# Patient Record
Sex: Female | Born: 1955 | Race: White | Hispanic: No | Marital: Married | Smoking: Never smoker
Health system: Southern US, Community
[De-identification: ages and names within clinical notes are randomized; demographics above are authoritative.]

## PROBLEM LIST (undated history)

## (undated) DIAGNOSIS — Z801 Family history of malignant neoplasm of trachea, bronchus and lung: Secondary | ICD-10-CM

## (undated) DIAGNOSIS — J309 Allergic rhinitis, unspecified: Secondary | ICD-10-CM

## (undated) DIAGNOSIS — Z803 Family history of malignant neoplasm of breast: Secondary | ICD-10-CM

## (undated) DIAGNOSIS — Z808 Family history of malignant neoplasm of other organs or systems: Secondary | ICD-10-CM

## (undated) DIAGNOSIS — Z9189 Other specified personal risk factors, not elsewhere classified: Secondary | ICD-10-CM

## (undated) DIAGNOSIS — K219 Gastro-esophageal reflux disease without esophagitis: Secondary | ICD-10-CM

## (undated) DIAGNOSIS — F411 Generalized anxiety disorder: Secondary | ICD-10-CM

## (undated) DIAGNOSIS — R198 Other specified symptoms and signs involving the digestive system and abdomen: Secondary | ICD-10-CM

## (undated) DIAGNOSIS — Z800 Family history of malignant neoplasm of digestive organs: Secondary | ICD-10-CM

## (undated) DIAGNOSIS — IMO0001 Reserved for inherently not codable concepts without codable children: Secondary | ICD-10-CM

## (undated) HISTORY — DX: Family history of malignant neoplasm of trachea, bronchus and lung: Z80.1

## (undated) HISTORY — DX: Generalized anxiety disorder: F41.1

## (undated) HISTORY — DX: Other specified personal risk factors, not elsewhere classified: Z91.89

## (undated) HISTORY — PX: TONSILLECTOMY: SUR1361

## (undated) HISTORY — DX: Family history of malignant neoplasm of other organs or systems: Z80.8

## (undated) HISTORY — DX: Other specified symptoms and signs involving the digestive system and abdomen: R19.8

## (undated) HISTORY — DX: Gastro-esophageal reflux disease without esophagitis: K21.9

## (undated) HISTORY — DX: Reserved for inherently not codable concepts without codable children: IMO0001

## (undated) HISTORY — DX: Allergic rhinitis, unspecified: J30.9

## (undated) HISTORY — DX: Family history of malignant neoplasm of digestive organs: Z80.0

## (undated) HISTORY — DX: Family history of malignant neoplasm of breast: Z80.3

---

## 1977-02-12 HISTORY — PX: BREAST BIOPSY: SHX20

## 1990-02-12 HISTORY — PX: ABDOMINAL HYSTERECTOMY: SHX81

## 1998-09-13 ENCOUNTER — Other Ambulatory Visit: Admission: RE | Admit: 1998-09-13 | Discharge: 1998-09-13 | Payer: Self-pay | Admitting: Obstetrics & Gynecology

## 1999-10-04 ENCOUNTER — Other Ambulatory Visit: Admission: RE | Admit: 1999-10-04 | Discharge: 1999-10-04 | Payer: Self-pay | Admitting: Obstetrics & Gynecology

## 2000-11-22 ENCOUNTER — Other Ambulatory Visit: Admission: RE | Admit: 2000-11-22 | Discharge: 2000-11-22 | Payer: Self-pay | Admitting: Obstetrics and Gynecology

## 2004-03-14 ENCOUNTER — Ambulatory Visit (HOSPITAL_COMMUNITY): Admission: RE | Admit: 2004-03-14 | Discharge: 2004-03-14 | Payer: Self-pay | Admitting: Plastic Surgery

## 2004-03-14 ENCOUNTER — Ambulatory Visit (HOSPITAL_BASED_OUTPATIENT_CLINIC_OR_DEPARTMENT_OTHER): Admission: RE | Admit: 2004-03-14 | Discharge: 2004-03-14 | Payer: Self-pay | Admitting: Plastic Surgery

## 2004-03-14 ENCOUNTER — Encounter (INDEPENDENT_AMBULATORY_CARE_PROVIDER_SITE_OTHER): Payer: Self-pay | Admitting: *Deleted

## 2005-03-02 ENCOUNTER — Other Ambulatory Visit: Admission: RE | Admit: 2005-03-02 | Discharge: 2005-03-02 | Payer: Self-pay | Admitting: Obstetrics and Gynecology

## 2005-03-28 ENCOUNTER — Encounter: Admission: RE | Admit: 2005-03-28 | Discharge: 2005-03-28 | Payer: Self-pay | Admitting: Obstetrics and Gynecology

## 2005-03-30 ENCOUNTER — Encounter: Admission: RE | Admit: 2005-03-30 | Discharge: 2005-03-30 | Payer: Self-pay | Admitting: Obstetrics and Gynecology

## 2006-04-19 ENCOUNTER — Encounter: Admission: RE | Admit: 2006-04-19 | Discharge: 2006-04-19 | Payer: Self-pay | Admitting: Obstetrics and Gynecology

## 2006-05-21 ENCOUNTER — Encounter: Payer: Self-pay | Admitting: Internal Medicine

## 2006-06-03 ENCOUNTER — Encounter: Payer: Self-pay | Admitting: Internal Medicine

## 2007-02-13 LAB — HM COLONOSCOPY: HM Colonoscopy: NORMAL

## 2007-04-21 ENCOUNTER — Encounter: Admission: RE | Admit: 2007-04-21 | Discharge: 2007-04-21 | Payer: Self-pay | Admitting: Obstetrics and Gynecology

## 2008-05-18 ENCOUNTER — Encounter: Admission: RE | Admit: 2008-05-18 | Discharge: 2008-05-18 | Payer: Self-pay | Admitting: Obstetrics and Gynecology

## 2009-03-16 ENCOUNTER — Ambulatory Visit: Payer: Self-pay | Admitting: Internal Medicine

## 2009-03-16 DIAGNOSIS — Z9189 Other specified personal risk factors, not elsewhere classified: Secondary | ICD-10-CM

## 2009-03-16 DIAGNOSIS — F411 Generalized anxiety disorder: Secondary | ICD-10-CM

## 2009-03-16 DIAGNOSIS — K219 Gastro-esophageal reflux disease without esophagitis: Secondary | ICD-10-CM

## 2009-03-16 HISTORY — DX: Generalized anxiety disorder: F41.1

## 2009-03-16 HISTORY — DX: Other specified personal risk factors, not elsewhere classified: Z91.89

## 2009-03-16 HISTORY — DX: Gastro-esophageal reflux disease without esophagitis: K21.9

## 2009-05-02 ENCOUNTER — Ambulatory Visit: Payer: Self-pay | Admitting: Internal Medicine

## 2009-05-12 ENCOUNTER — Encounter: Payer: Self-pay | Admitting: Internal Medicine

## 2009-12-26 ENCOUNTER — Telehealth: Payer: Self-pay | Admitting: Internal Medicine

## 2009-12-28 ENCOUNTER — Telehealth (INDEPENDENT_AMBULATORY_CARE_PROVIDER_SITE_OTHER): Payer: Self-pay | Admitting: *Deleted

## 2010-01-10 ENCOUNTER — Telehealth: Payer: Self-pay | Admitting: Internal Medicine

## 2010-01-30 ENCOUNTER — Ambulatory Visit: Payer: Self-pay | Admitting: Internal Medicine

## 2010-01-31 DIAGNOSIS — J309 Allergic rhinitis, unspecified: Secondary | ICD-10-CM | POA: Insufficient documentation

## 2010-01-31 DIAGNOSIS — IMO0001 Reserved for inherently not codable concepts without codable children: Secondary | ICD-10-CM

## 2010-01-31 DIAGNOSIS — R198 Other specified symptoms and signs involving the digestive system and abdomen: Secondary | ICD-10-CM

## 2010-01-31 HISTORY — DX: Reserved for inherently not codable concepts without codable children: IMO0001

## 2010-01-31 HISTORY — DX: Allergic rhinitis, unspecified: J30.9

## 2010-01-31 HISTORY — DX: Other specified symptoms and signs involving the digestive system and abdomen: R19.8

## 2010-03-12 LAB — CONVERTED CEMR LAB
ALT: 15 units/L (ref 0–35)
AST: 20 units/L (ref 0–37)
Alkaline Phosphatase: 62 units/L (ref 39–117)
BUN: 9 mg/dL (ref 6–23)
CO2: 31 meq/L (ref 19–32)
Calcium: 9.6 mg/dL (ref 8.4–10.5)
Chloride: 106 meq/L (ref 96–112)
Creatinine, Ser: 0.6 mg/dL (ref 0.4–1.2)
Direct LDL: 129.1 mg/dL
Eosinophils Relative: 1.5 % (ref 0.0–5.0)
GFR calc non Af Amer: 111.76 mL/min (ref >60)
HCT: 39.4 % (ref 36.0–46.0)
Hemoglobin, Urine: NEGATIVE
Leukocytes, UA: NEGATIVE
Lymphocytes Relative: 40.4 % (ref 12.0–46.0)
Lymphs Abs: 1.4 10*3/uL (ref 0.7–4.0)
MCHC: 33.6 g/dL (ref 30.0–36.0)
Monocytes Absolute: 0.3 10*3/uL (ref 0.1–1.0)
Nitrite: NEGATIVE
Potassium: 4.3 meq/L (ref 3.5–5.1)
RBC: 4.51 M/uL (ref 3.87–5.11)
Sodium: 142 meq/L (ref 135–145)
Specific Gravity, Urine: 1.01 (ref 1.000–1.030)
TSH: 0.79 microintl units/mL (ref 0.35–5.50)
Total Bilirubin: 0.8 mg/dL (ref 0.3–1.2)
Total CHOL/HDL Ratio: 3
Total Protein, Urine: NEGATIVE mg/dL
Total Protein: 7.1 g/dL (ref 6.0–8.3)
Triglycerides: 73 mg/dL (ref 0.0–149.0)
WBC: 3.5 10*3/uL — ABNORMAL LOW (ref 4.5–10.5)

## 2010-03-14 NOTE — Procedures (Signed)
Summary: Colonoscopy/[facility]  [facility]   Imported By: Sherian Rein 04/15/2009 11:12:55  _____________________________________________________________________  External Attachment:    Type:   Image     Comment:   External Document

## 2010-03-14 NOTE — Assessment & Plan Note (Signed)
Summary: NEW [employer] PT--PKG--STC   Vital Signs:  Patient profile:   55 year old female Height:      64.5 inches (163.83 cm) Weight:      126.4 pounds (57.45 kg) BMI:     21.44 O2 Sat:      97 % on Room air Temp:     98.3 degrees F (36.83 degrees C) oral Pulse rate:   73 / minute BP sitting:   110 / 78  (left arm) Cuff size:   regular  Vitals Entered By: Orlan Leavens (March 16, 2009 8:14 AM)  O2 Flow:  Room air CC: New patient Is Patient Diabetic? No Pain Assessment Patient in pain? no        Primary Care Provider:  Newt Lukes MD  CC:  New patient.  History of Present Illness: new pt to me and our practice - here to est care -  patient is here today for annual physical. Patient feels well and has no complaints.   Preventive Screening-Counseling & Management  Alcohol-Tobacco     Alcohol drinks/day: <1     Alcohol type: wine     Alcohol Counseling: not indicated; use of alcohol is not excessive or problematic     Smoking Status: never     Tobacco Counseling: not indicated; no tobacco use  Caffeine-Diet-Exercise     Does Patient Exercise: yes     Exercise Counseling: not indicated; exercise is adequate     Depression Counseling: not indicated; screening negative for depression  Hep-HIV-STD-Contraception     Dental Care Counseling: not indicated; dental care within six months  Safety-Violence-Falls     Seat Belt Use: yes     Seat Belt Counseling: not indicated; patient wears seat belts     Firearms in the Home: firearms in the home     Firearm Counseling: not indicated; uses recommended firearm safety measures     Smoke Detectors: yes     Smoke Detector Counseling: n/a     Violence in the Home: no risk noted     Violence Counseling: not indicated; no violence risk noted     Sexual Abuse: no     Sexual Abuse Counseling: n/a     Fall Risk Counseling: not indicated; no significant falls noted  Clinical Review Panels:  Prevention   Last Mammogram:   [facility].   Assessment: BIRADS 2. (05/18/2008)   Last Pap Smear:  Interpretation/Result:Negative for intraepithelial Lesion or Malignancy.    (02/13/2008)   Last Colonoscopy:  Pt states does'nt remember md name who done colonoscopy. was sent by dr. Vincente Poli. results was normal (02/13/2007)  Immunizations   Last Tetanus Booster:  Historical (02/13/2007)   Current Medications (verified): 1)  Multivitamins  Tabs (Multiple Vitamin) .... Once Daily 2)  Calcium 500 Mg Tabs (Calcium) .... Once Daily  Allergies (verified): No Known Drug Allergies  Past History:  Past Medical History: GERD anxiety  MD rooster: [facility]  Past Surgical History: Tonsillectomy Breast biopsy (1979) hysterectomy - 1992  Family History: Family History of Arthritis, RA (grandparent) Family History Breast cancer 1st degree relative <50 (other relativ) Family History of Colon CA 1st degree relative <60 ( grandparent) Family History Diabetes 1st degree relative (parent) Family History Hypertension (parent) Family History Lung cancer (grandparent) Heart disease (grandparent)  Social History: Never Smoked married lives with spouse 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   Physical Exam  General:  alert, well-developed, well-nourished, and cooperative to examination.    Eyes:  vision grossly intact; pupils equal, round and reactive to light.  conjunctiva and lids normal.    Ears:  normal pinnae bilaterally, without erythema, swelling, or tenderness to palpation. TMs clear, without effusion, or cerumen impaction. Hearing grossly normal bilaterally  Mouth:  teeth and gums in good repair; mucous membranes  moist, without lesions or ulcers. oropharynx clear without exudate, no erythema.  Neck:  No deformities, masses, or tenderness noted. Lungs:  normal respiratory effort, no intercostal retractions or use of accessory muscles; normal breath sounds bilaterally - no crackles and no wheezes.    Heart:  normal rate, regular rhythm, no murmur, and no rub. BLE without edema. normal DP pulses and normal cap refill in all 4 extremities    Abdomen:  soft, non-tender, normal bowel sounds, no distention; no masses and no appreciable hepatomegaly or splenomegaly.   Genitalia:  defer to gyn Msk:  No deformity or scoliosis noted of thoracic or lumbar spine.   Neurologic:  alert & oriented X3 and cranial nerves II-XII symetrically intact.  strength normal in all extremities, sensation intact to light touch, and gait normal. speech fluent without dysarthria or aphasia; follows commands with good comprehension.  Skin:  no rashes, vesicles, ulcers, or erythema. No nodules or irregularity to palpation.  Psych:  Oriented X3, memory intact for recent and remote, normally interactive, good eye contact, not agitated, not suicidal, dysphoric affect, and slightly anxious.     Impression & Recommendations:  Problem # 1:  PREVENTIVE HEALTH CARE (ICD-V70.0) Patient has been counseled on age-appropriate routine health concerns for screening and prevention. These are reviewed and up-to-date. Immunizations are up-to-date or declined. Labs ordered and ECG reviewed--> NSR, no ischemic changes.  Orders: EKG w/ Interpretation (93000) TLB-Lipid Panel (80061-LIPID) TLB-BMP (Basic Metabolic Panel-BMET) (80048-METABOL) TLB-CBC Platelet - w/Differential (85025-CBCD) TLB-Hepatic/Liver Function Pnl (80076-HEPATIC) TLB-TSH (Thyroid Stimulating Hormone) (84443-TSH) T-Vitamin D (25-Hydroxy) (86578-46962) TLB-Udip ONLY (81003-UDIP) T-Bone Densitometry (95284) T-Lumbar Vertebral Assessment (13244)  Problem # 2:  ANXIETY STATE,  UNSPECIFIED (ICD-300.00)  recent inc in symptoms manifest as SOB, likely related to stress at work per pt d/w pt today re: med and behavioral counseling options for tx of same has Lexapro samples from urg care eval for same but has not yet begun due to concerns over SE (wt gain) will refer to behav health to try to tx without med tx - pt agrees - if decides to take meds, i will be happy to refill -- or try alt med if lexapro "not the right one" Her updated medication list for this problem includes:    Lexapro 10 Mg Tabs (Escitalopram oxalate) .Marland Kitchen... 1 by mouth once daily - not taking  Orders: Psychology Referral (Psychology)  Complete Medication List: 1)  Multivitamins Tabs (Multiple vitamin) .... Once daily 2)  Calcium 500 Mg Tabs (Calcium) .... Once daily 3)  Lexapro 10 Mg Tabs (Escitalopram oxalate) .Marland Kitchen.. 1 by mouth once daily - not taking  Patient Instructions: 1)  it was good to see you today.  2)  exam and EKG look very normal -  3)  test(s) ordered today - your results will be posted on the phone tree for review in 48-72 hours  from the time of test completion; call [phone] and enter your 9 digit MRN (listed above on this page, just below your name); if any changes need to be made or there are abnormal results, you will be contacted directly.  4)  continue to follow with Dr. Vincente Poli for your annual PAP/pelvic 5)  will send for records from Dr. Loreta Ave re: your last colonoscopy 6)  we'll make referral to behavioral health as discussed; also for a bone density screening. Our office will contact you regarding these appointments once made.  7)  If you feel the anxiety is getting worse or you would like to try alternative medication treatment, please make an appointment to discuss further 8)  Please schedule a follow-up appointment annually for medical physical, sooner if problems.  9)  It is important that you continue to exercise regularly at least 20 minutes, preferrably 30 minutes+,  4-5 times a week.  10)  Take calcium +Vitamin D daily.   Pap Smear  Procedure date:  02/13/2008  Findings:      Interpretation/Result:Negative for intraepithelial Lesion or Malignancy.     Mammogram  Procedure date:  02/13/2008  Findings:      No specific mammographic evidence of malignancy.    Colonoscopy  Procedure date:  02/13/2007  Findings:      Pt states does'nt remember md name who done colonoscopy. was sent by dr. Vincente Poli. results was normal    Immunization History:  Tetanus/Td Immunization History:    Tetanus/Td:  historical (02/13/2007)

## 2010-03-14 NOTE — Progress Notes (Signed)
  Phone Note Other Incoming   Request: Send information Summary of Call: Received records from Dr. Loreta Ave, 5pages sent up to Dr. Felicity Coyer.

## 2010-03-14 NOTE — Progress Notes (Signed)
Summary: FYI/ Colonoscopy  Phone Note Call from Patient Call back at Home Phone [phone]   Caller: Patient Summary of Call: Pt called to inform MD that she has requested a copy of colonoscopy report be sent from Dr Loreta Ave to [facility] for review and advisement. Pt had polyps removed and would like VAL to advise if she needs repeat colonoscopy. Initial call taken by: Margaret Pyle, CMA,  December 26, 2009 9:10 AM  Follow-up for Phone Call        ok - will review - thanks Follow-up by: Newt Lukes MD,  December 26, 2009 9:48 AM

## 2010-03-14 NOTE — Progress Notes (Signed)
Summary: Second opinion  Phone Note Call from Patient Call back at Home Phone [phone]   Caller: Patient Summary of Call: Pt called requesting VAL review colonoscopy done by Dr Loreta Ave and advise if second colonoscopy should be done due to polyps, please advise, pt refuses OV to discuss. Initial call taken by: Margaret Pyle, CMA,  January 10, 2010 3:40 PM  Follow-up for Phone Call        i have reviewed the colo report and path reports from 06/04/06 colo - benign polyps found, no cnacer changes - therefore rec f/u colo 10year - this would be 05/2016 - thanks Follow-up by: Newt Lukes MD,  January 11, 2010 5:27 PM  Additional Follow-up for Phone Call Additional follow up Details #1::        Pt advised and if she has any further questions she will discuss at next OV Additional Follow-up by: Margaret Pyle, CMA,  January 12, 2010 8:47 AM

## 2010-03-16 NOTE — Assessment & Plan Note (Signed)
Summary: pain base skull/cd   Vital Signs:  Patient profile:   55 year old female Height:      64 inches Weight:      128 pounds BMI:     22.05 O2 Sat:      98 % on Room air Temp:     98.4 degrees F oral Pulse rate:   70 / minute BP sitting:   118 / 70  (left arm) Cuff size:   regular  Vitals Entered By: Margaret Pyle, CMA (January 30, 2010 4:10 PM)  O2 Flow:  Room air CC: Tightness of the back of the neck x 1 wk, most severe in AM Comments Pt is states she has never taken Lexapro    Primary Care Provider:  Newt Lukes MD  CC:  Tightness of the back of the neck x 1 wk and most severe in AM.  History of Present Illness: c/o neck pain onset 1 week ago radiates up left side to base of skull- no precipitating trauma or overuse - no radiation into back or lue - tender to touch but not exac with movement or poition - assoc with mild left inner earache and sinus pressure partially relieved with otc nsaid - no HA, no fever -  would also like to see GI -  ongoing episodic BRBPR - hx same concerned due to personal and FH colon polyps  Clinical Review Panels:  Lipid Management   Cholesterol:  216 (03/16/2009)   HDL (good cholesterol):  68.70 (03/16/2009)  CBC   WBC:  3.5 (03/16/2009)   RBC:  4.51 (03/16/2009)   Hgb:  13.2 (03/16/2009)   Hct:  39.4 (03/16/2009)   Platelets:  199.0 (03/16/2009)   MCV  87.2 (03/16/2009)   MCHC  33.6 (03/16/2009)   RDW  13.1 (03/16/2009)   PMN:  48.6 (03/16/2009)   Lymphs:  40.4 (03/16/2009)   Monos:  8.3 (03/16/2009)   Eosinophils:  1.5 (03/16/2009)   Basophil:  1.2 (03/16/2009)  Complete Metabolic Panel   Glucose:  91 (03/16/2009)   Sodium:  142 (03/16/2009)   Potassium:  4.3 (03/16/2009)   Chloride:  106 (03/16/2009)   CO2:  31 (03/16/2009)   BUN:  9 (03/16/2009)   Creatinine:  0.6 (03/16/2009)   Albumin:  4.4 (03/16/2009)   Total Protein:  7.1 (03/16/2009)   Calcium:  9.6 (03/16/2009)   Total Bili:  0.8  (03/16/2009)   Alk Phos:  62 (03/16/2009)   SGPT (ALT):  15 (03/16/2009)   SGOT (AST):  20 (03/16/2009)   Allergies: No Known Drug Allergies  Past History:  Past Medical History: GERD anxiety  MD roster:  [facility]  Family History: Family History of Arthritis, RA (grandparent) Family History Breast cancer 1st degree relative <50 (other relativ) Family History of Colon CA 1st degree relative <60 ( grandparent) Family History Diabetes 1st degree relative (parent) Family History Hypertension (parent) Family History Lung cancer (grandparent)  Heart disease (grandparent)  Social History: Never Smoked married 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    Physical Exam  General:  alert, well-developed, well-nourished, and cooperative to examination.   nontoxic Head:  Normocephalic and atraumatic without obvious abnormalities. No apparent alopecia or balding. Eyes:  vision grossly intact; pupils equal, round and reactive to light.  conjunctiva and lids normal.    Ears:  normal pinnae bilaterally, without erythema, swelling, or tenderness to palpation. TMs clear, without effusion, or cerumen impaction. Hearing grossly normal bilaterally  Mouth:  teeth and gums in good repair; mucous membranes moist, without lesions or ulcers. oropharynx clear without exudate, no erythema.  Lungs:  normal respiratory effort, no intercostal retractions or use of accessory muscles; normal breath sounds bilaterally - no crackles and no wheezes.    Heart:  normal rate, regular rhythm, no murmur, and no rub. BLE without edema. Msk:  spasm along right neck to palp Neurologic:  alert & oriented X3 and cranial nerves II-XII symetrically intact.  strength normal in all extremities, sensation intact to light touch,  and gait normal. speech fluent without dysarthria or aphasia; follows commands with good comprehension.  Skin:  no folliculitits or cellulitis over affecetd neck/scalp - no rashes, vesicles, ulcers, or erythema. No nodules or irregularity to palpation.    Impression & Recommendations:  Problem # 1:  UNSPECIFIED MYALGIA AND MYOSITIS (ICD-729.1)  neck pain c/w myofascial spasm - no evidence on exam for scalp or ear infx - add NSAID and m relaxant trial -  also nasal steroid for sinus and allg symptoms which may be contrib to earache symptoms  - see next Her updated medication list for this problem includes:    Flexeril 5 Mg Tabs (Cyclobenzaprine hcl) .Marland Kitchen... 1 by mouth at bedtime as needed for muscle spasm and neck pain  Orders: Prescription Created Electronically [phone])  Problem # 2:  ALLERGIC RHINITIS (ICD-477.9)  Her updated medication list for this problem includes:    Flonase 50 Mcg/act Susp (Fluticasone propionate) .Marland Kitchen... 2 spray each nostril  every morning  Orders: Prescription Created Electronically [phone])  Problem # 3:  CHANGE IN BOWELS (ICD-787.99)  chronic issues, ongoing BRBPR - extensive review of prior colo and polyp path reviewed with pt today refer to GI for re-eval of same issues and sched f/u colo as felt approp -  Orders: Gastroenterology Referral (GI)  Complete Medication List: 1)  Multivitamins Tabs (Multiple vitamin) .... Once daily 2)  Calcium 500 Mg Tabs (Calcium) .... Once daily 3)  Lexapro 10 Mg Tabs (Escitalopram oxalate) .Marland Kitchen.. 1 by mouth once daily - not taking 4)  Flonase 50 Mcg/act Susp (Fluticasone propionate) .... 2 spray each nostril  every morning 5)  Flexeril 5 Mg Tabs (Cyclobenzaprine hcl) .Marland Kitchen.. 1 by mouth at bedtime as needed for muscle spasm and neck pain  Patient Instructions: 1)  it was good to see you today. 2)  flonase and flexeril as discussed - your prescriptions have been electronically submitted to your pharmacy. Please take as  directed. Contact our office if you believe you're having problems with the medication(s).  3)  we'll make referral to [facility] to review change in bowels and possible repeat colonoscopy. Our office will contact you regarding this appointment once made.  4)  Please schedule a follow-up appointment as needed. Prescriptions: FLEXERIL 5 MG TABS (CYCLOBENZAPRINE HCL) 1 by mouth at bedtime as needed for muscle spasm and neck pain  #30 x 1   Entered and Authorized by:   Newt Lukes MD   Signed by:   Newt Lukes MD on 01/30/2010   Method used:   Electronically to        [facility] [phone]* (retail)       [address] [place], [place]  [address]  Ph: [phone]       Fax: [phone]   RxID:   [id number] FLONASE 50 MCG/ACT SUSP (FLUTICASONE PROPIONATE) 2 spray each nostril  every morning  #1 x 0   Entered and Authorized by:   Newt Lukes MD   Signed by:   Newt Lukes MD on 01/30/2010   Method used:   Electronically to        [facility] [id number] (retail)       [address] [place], [place]  [address]       Ph: [phone]       Fax: [phone]   RxID:   [id number]    Orders Added: 1)  Est. Patient Level IV [84166] 2)  Prescription Created Electronically [[id number]] 3)  Gastroenterology Referral [GI]

## 2010-08-22 ENCOUNTER — Other Ambulatory Visit: Payer: Self-pay | Admitting: Obstetrics and Gynecology

## 2010-08-23 ENCOUNTER — Ambulatory Visit: Payer: Self-pay | Admitting: Internal Medicine

## 2010-08-24 ENCOUNTER — Other Ambulatory Visit (INDEPENDENT_AMBULATORY_CARE_PROVIDER_SITE_OTHER): Payer: Managed Care, Other (non HMO)

## 2010-08-24 ENCOUNTER — Encounter: Payer: Self-pay | Admitting: Internal Medicine

## 2010-08-24 ENCOUNTER — Ambulatory Visit (INDEPENDENT_AMBULATORY_CARE_PROVIDER_SITE_OTHER): Payer: Managed Care, Other (non HMO) | Admitting: Internal Medicine

## 2010-08-24 ENCOUNTER — Ambulatory Visit (INDEPENDENT_AMBULATORY_CARE_PROVIDER_SITE_OTHER)
Admission: RE | Admit: 2010-08-24 | Discharge: 2010-08-24 | Disposition: A | Discharge status: Home / Self Care | Payer: Managed Care, Other (non HMO) | Source: Ambulatory Visit | Attending: Internal Medicine | Admitting: Internal Medicine

## 2010-08-24 VITALS — BP 108/68 | HR 73 | Temp 98.0°F | Ht 64.0 in | Wt 123.8 lb

## 2010-08-24 DIAGNOSIS — F411 Generalized anxiety disorder: Secondary | ICD-10-CM

## 2010-08-24 DIAGNOSIS — R634 Abnormal weight loss: Secondary | ICD-10-CM

## 2010-08-24 DIAGNOSIS — R350 Frequency of micturition: Secondary | ICD-10-CM

## 2010-08-24 DIAGNOSIS — F988 Other specified behavioral and emotional disorders with onset usually occurring in childhood and adolescence: Secondary | ICD-10-CM | POA: Insufficient documentation

## 2010-08-24 DIAGNOSIS — R5381 Other malaise: Secondary | ICD-10-CM

## 2010-08-24 DIAGNOSIS — R5383 Other fatigue: Secondary | ICD-10-CM

## 2010-08-24 LAB — BASIC METABOLIC PANEL
BUN: 16 mg/dL (ref 6–23)
CO2: 31 mEq/L (ref 19–32)
Calcium: 9.5 mg/dL (ref 8.4–10.5)
Creatinine, Ser: 0.6 mg/dL (ref 0.4–1.2)
Glucose, Bld: 97 mg/dL (ref 70–99)

## 2010-08-24 LAB — CBC WITH DIFFERENTIAL/PLATELET
Basophils Relative: 0.5 % (ref 0.0–3.0)
Eosinophils Absolute: 0.1 10*3/uL (ref 0.0–0.7)
Eosinophils Relative: 1.2 % (ref 0.0–5.0)
MCHC: 33.8 g/dL (ref 30.0–36.0)
MCV: 86.5 fl (ref 78.0–100.0)
Monocytes Absolute: 0.4 10*3/uL (ref 0.1–1.0)
Monocytes Relative: 7.3 % (ref 3.0–12.0)
Neutro Abs: 3.4 10*3/uL (ref 1.4–7.7)
Neutrophils Relative %: 56.4 % (ref 43.0–77.0)
Platelets: 230 10*3/uL (ref 150.0–400.0)

## 2010-08-24 LAB — GLUCOSE, POCT (MANUAL RESULT ENTRY)

## 2010-08-24 LAB — POCT URINALYSIS DIPSTICK
Bilirubin, UA: NEGATIVE
Glucose, UA: NEGATIVE
Leukocytes, UA: NEGATIVE
Spec Grav, UA: 1.01

## 2010-08-24 LAB — HEPATIC FUNCTION PANEL: Alkaline Phosphatase: 71 U/L (ref 39–117)

## 2010-08-24 MED ORDER — ALPRAZOLAM 0.25 MG PO TABS: ORAL_TABLET | ORAL | Status: DC

## 2010-08-24 NOTE — Assessment & Plan Note (Signed)
Udip neg and cbg 110 in the office, exam benign, ok to follow

## 2010-08-24 NOTE — Patient Instructions (Addendum)
Take all new medications as prescribed Continue all other medications as before Please go to XRAY in the Basement for the x-ray test Please go to LAB in the Basement for the blood and/or urine tests to be done today Please call the phone number [phone] (the PhoneTree System) for results of testing in 2-3 days;  When calling, simply dial the number, and when prompted enter the MRN number above (the Medical Record Number) and the # key, then the message should start.

## 2010-08-24 NOTE — Progress Notes (Signed)
Quick Note:  Voice message left on PhoneTree system - lab is negative, normal or otherwise stable, pt to continue same tx ______ 

## 2010-08-29 ENCOUNTER — Encounter: Payer: Self-pay | Admitting: Internal Medicine

## 2010-08-29 NOTE — Assessment & Plan Note (Signed)
Mild, unclear etiology, for lab eval today

## 2010-08-29 NOTE — Assessment & Plan Note (Signed)
Mild uncontrolled, for med prn,  to f/u any worsening symptoms or concerns

## 2010-08-29 NOTE — Progress Notes (Signed)
Subjective:    Patient ID: Angela Fleming, female    DOB: March 12, 1955, 55 y.o.   MRN: [id number]  HPI Here to f/u feeling "shaky inside", just not feeling well, has marked fatigue with any activity out of the ordinary, has overall been less active this past yr but wt has been down 5 lbs since last December;  Overall per pt stressors seem less now, denies worsening depressive symptoms, denies fever, pain but has had ocassional episodes feeling warm. Legs jump with trying to go to sleep some nights but better with ibuprofen;  Has recently returned from [place] with mult bug bites , cant be sure she was not exposed to tick adn requests lyme dz testing.  Pt denies chest pain, increased sob or doe, wheezing, orthopnea, PND, increased LE swelling, palpitations, dizziness or syncope. Pt denies new neurological symptoms such as new headache, or facial or extremity weakness or radicular pain or numbness.  Not taking the flexeril, lexapro as recommended, but is taking a progesterone cream per GYN.   Pt denies polydipsia, polyuria, but has had some urinary freq in the past few days, but  Denies urinary symptoms such as dysuria, frequency, urgency,or hematuria. Past Medical History  Diagnosis Date  . ALLERGIC RHINITIS 01/31/2010  . Anxiety state, unspecified 03/16/2009  . CHANGE IN BOWELS 01/31/2010  . CHICKENPOX, HX OF 03/16/2009  . GERD 03/16/2009  . UNSPECIFIED MYALGIA AND MYOSITIS 01/31/2010   Past Surgical History  Procedure Date  . Tonsillectomy   . Breast biopsy 1979  . Abdominal hysterectomy 1992    reports that she has never smoked. She does not have any smokeless tobacco history on file. Her alcohol and drug histories not on file. family history includes Arthritis in her other; Cancer in her other; Diabetes in her other; Heart disease in her other; and Hypertension in her other. No Known Allergies Current Outpatient Prescriptions on File Prior to Visit  Medication Sig Dispense Refill  . calcium  gluconate 500 MG tablet Take 500 mg by mouth daily.        . Multiple Vitamin (MULTIVITAMIN) capsule Take 1 capsule by mouth daily.           Review of Systems Review of Systems  Constitutional: Negative for diaphoresis.  HENT: Negative for drooling and tinnitus.   Eyes: Negative for photophobia and visual disturbance.  Respiratory: Negative for choking and stridor.   Gastrointestinal: Negative for vomiting and blood in stool.  Genitourinary: Negative for hematuria and decreased urine volume.  Musculoskeletal: Negative for gait problem.  Skin: Negative for color change and wound.  Neurological: Negative for tremors and numbness.  Psychiatric/Behavioral: Negative for decreased concentration. The patient is not hyperactive.       Objective:   Physical Exam BP 108/68  Pulse 73  Temp(Src) 98 F (36.7 C) (Oral)  Ht 5\' 4"  (1.626 m)  Wt 123 lb 12.8 oz (56.155 kg)  BMI 21.25 kg/m2  SpO2 97% Physical Exam  VS noted Constitutional: Pt appears well-developed and well-nourished.  HENT: Head: Normocephalic.  Right Ear: External ear normal.  Left Ear: External ear normal.  Eyes: Conjunctivae and EOM are normal. Pupils are equal, round, and reactive to light.  Neck: Normal range of motion. Neck supple.  Cardiovascular: Normal rate and regular rhythm.   Pulmonary/Chest: Effort normal and breath sounds normal.  Abd:  Soft, NT, non-distended, + BS Neurological: Pt is alert. No cranial nerve deficit.  Skin: Skin is warm. No erythema. No rash Psychiatric: Pt behavior is  normal. Thought content normal.  1+ nervous       Assessment & Plan:

## 2010-08-29 NOTE — Assessment & Plan Note (Signed)
Etiology unclear, Exam otherwise benign, to check labs as documented, follow with expectant management  

## 2010-09-11 ENCOUNTER — Telehealth: Payer: Self-pay

## 2010-09-11 DIAGNOSIS — R0600 Dyspnea, unspecified: Secondary | ICD-10-CM

## 2010-09-11 NOTE — Telephone Encounter (Signed)
Pt called stating that per CXR done by Dr Jonny Ruiz she may have COPD or Asthma. Pt indicates she does have occasional SOB and is requesting advisement on follow up either with VAL or referral to [facility], please advise.

## 2010-09-11 NOTE — Telephone Encounter (Signed)
Pt advised and will expect a call from [facility] with appt info 

## 2010-09-11 NOTE — Telephone Encounter (Signed)
CXR report reviewed- will refer to pulm for eval of same as per pt request -= thanks

## 2010-10-04 ENCOUNTER — Institutional Professional Consult (permissible substitution): Payer: Managed Care, Other (non HMO) | Admitting: Pulmonary Disease

## 2010-10-09 ENCOUNTER — Other Ambulatory Visit: Payer: Self-pay | Admitting: Obstetrics and Gynecology

## 2010-10-09 DIAGNOSIS — Z1231 Encounter for screening mammogram for malignant neoplasm of breast: Secondary | ICD-10-CM

## 2010-10-20 ENCOUNTER — Encounter: Payer: Self-pay | Admitting: Pulmonary Disease

## 2010-10-20 ENCOUNTER — Ambulatory Visit (INDEPENDENT_AMBULATORY_CARE_PROVIDER_SITE_OTHER): Payer: Managed Care, Other (non HMO) | Admitting: Pulmonary Disease

## 2010-10-20 VITALS — BP 114/78 | HR 76 | Temp 98.1°F | Ht 64.0 in | Wt 125.4 lb

## 2010-10-20 DIAGNOSIS — R0600 Dyspnea, unspecified: Secondary | ICD-10-CM

## 2010-10-20 DIAGNOSIS — R0609 Other forms of dyspnea: Secondary | ICD-10-CM

## 2010-10-20 DIAGNOSIS — R0989 Other specified symptoms and signs involving the circulatory and respiratory systems: Secondary | ICD-10-CM

## 2010-10-20 NOTE — Progress Notes (Signed)
  Subjective:    Patient ID: Angela Fleming, female    DOB: 1955-04-11, 55 y.o.   MRN: [id number]  HPI The patient is a 55 year old female who I've been asked to see for an abnormal chest x-ray and also dyspnea.  She has noted shortness of breath at times for the last one year, and primarily notices this when she is at rest.  She has issues whenever she gets stressed, and will take a Xanax for this.  It also can occur whenever she is sitting and also eating, but not necessarily when she is full.  She denies any shortness of breath with exertional cavity such as bringing groceries in from the car, housework, or walking.  She does not exercise on a regular basis, and does feel that she has decreased stamina.  The patient denies any significant cough but she does have a tickle in her throat with throat clearing.  She does have a history of reflux and questionable hiatal hernia, although she does not have significant reflux symptoms.  She has no significant allergy symptoms or chronic sinus disease.  She has no history of asthma or premature lung disease in her family.  She has had a recent chest x-ray that showed hyperinflation, but no acute process.   Review of Systems  Constitutional: Negative for fever and unexpected weight change.  HENT: Positive for ear pain and sneezing. Negative for nosebleeds, congestion, sore throat, rhinorrhea, trouble swallowing, dental problem, postnasal drip and sinus pressure.   Eyes: Negative for redness and itching.  Respiratory: Positive for shortness of breath. Negative for cough, chest tightness and wheezing.   Cardiovascular: Negative for palpitations and leg swelling.  Gastrointestinal: Negative for nausea and vomiting.  Genitourinary: Negative for dysuria.  Musculoskeletal: Negative for joint swelling.  Skin: Negative for rash.  Neurological: Negative for headaches.  Hematological: Does not bruise/bleed easily.  Psychiatric/Behavioral: Negative for dysphoric mood.  The patient is nervous/anxious.        Objective:   Physical Exam Constitutional:  Well developed, no acute distress  HENT:  Nares patent without discharge  Oropharynx without exudate, palate and uvula are normal  Eyes:  Perrla, eomi, no scleral icterus  Neck:  No JVD, no TMG  Cardiovascular:  Normal rate, regular rhythm, no rubs or gallops.  No murmurs        Intact distal pulses  Pulmonary :  Normal breath sounds, no stridor or respiratory distress   No rales, rhonchi, or wheezing  Abdominal:  Soft, nondistended, bowel sounds present.  No tenderness noted.   Musculoskeletal:  No lower extremity edema noted.  Lymph Nodes:  No cervical lymphadenopathy noted  Skin:  No cyanosis noted  Neurologic:  Alert, appropriate, moves all 4 extremities without obvious deficit.         Assessment & Plan:

## 2010-10-20 NOTE — Assessment & Plan Note (Signed)
The patient has noticed dyspnea primarily at rest over the last one year, and her recent chest x-ray shows hyperinflation but no acute process.  Her symptoms are really atypical in that they do not occur with exertion.  It is unclear whether she may have air flow limitation that is responsible for this, or whether this is simply related to anxiety or possibly occult reflux disease.  At this point, she will need pulmonary function studies to assess for air flow obstruction.  I will see her back on the same day to discuss the results.

## 2010-10-20 NOTE — Patient Instructions (Signed)
Will set up for pulmonary function tests, and see you back the same day to review.

## 2010-10-23 ENCOUNTER — Ambulatory Visit
Admission: RE | Admit: 2010-10-23 | Discharge: 2010-10-23 | Disposition: A | Discharge status: Home / Self Care | Payer: Managed Care, Other (non HMO) | Source: Ambulatory Visit | Attending: Obstetrics and Gynecology | Admitting: Obstetrics and Gynecology

## 2010-10-23 DIAGNOSIS — Z1231 Encounter for screening mammogram for malignant neoplasm of breast: Secondary | ICD-10-CM

## 2010-10-26 ENCOUNTER — Other Ambulatory Visit: Payer: Self-pay | Admitting: Obstetrics and Gynecology

## 2010-10-26 DIAGNOSIS — R928 Other abnormal and inconclusive findings on diagnostic imaging of breast: Secondary | ICD-10-CM

## 2010-11-02 ENCOUNTER — Ambulatory Visit
Admission: RE | Admit: 2010-11-02 | Discharge: 2010-11-02 | Disposition: A | Discharge status: Home / Self Care | Payer: Managed Care, Other (non HMO) | Source: Ambulatory Visit | Attending: Obstetrics and Gynecology | Admitting: Obstetrics and Gynecology

## 2010-11-02 DIAGNOSIS — R928 Other abnormal and inconclusive findings on diagnostic imaging of breast: Secondary | ICD-10-CM

## 2010-11-08 ENCOUNTER — Telehealth: Payer: Self-pay | Admitting: Pulmonary Disease

## 2010-11-08 ENCOUNTER — Ambulatory Visit (INDEPENDENT_AMBULATORY_CARE_PROVIDER_SITE_OTHER): Payer: Managed Care, Other (non HMO) | Admitting: Pulmonary Disease

## 2010-11-08 ENCOUNTER — Encounter: Payer: Self-pay | Admitting: Pulmonary Disease

## 2010-11-08 VITALS — BP 122/78 | HR 83 | Temp 98.1°F | Ht 64.0 in | Wt 125.0 lb

## 2010-11-08 DIAGNOSIS — R0989 Other specified symptoms and signs involving the circulatory and respiratory systems: Secondary | ICD-10-CM

## 2010-11-08 DIAGNOSIS — R0600 Dyspnea, unspecified: Secondary | ICD-10-CM

## 2010-11-08 DIAGNOSIS — R0609 Other forms of dyspnea: Secondary | ICD-10-CM

## 2010-11-08 LAB — PULMONARY FUNCTION TEST

## 2010-11-08 NOTE — Telephone Encounter (Signed)
KC please advise PFT results once reviewed, thanks!

## 2010-11-08 NOTE — Telephone Encounter (Signed)
Called, spoke with pt.  I informed her KC really recs she come in for OV to discuss results and recs with her face to face.  She is ok with this but states either a first thing or last appt at 4:30 would work best for her. KC does not have a 9am appt opening until the middle of Oct - pt ok with a 9:15 but would first like to see if she can be worked in at [facility].  Megan/Dr. Shelle Iron, pls advise if there is a day pt can be worked in at [facility] -- Thank you!

## 2010-11-08 NOTE — Telephone Encounter (Signed)
She had an apptm today and left without being seen.  She really needs ov to go over these since there are mixed messages from the results.  I need to discuss results with her face to face, and consider whether to start a sample trial medication.

## 2010-11-08 NOTE — Telephone Encounter (Signed)
Spoke with pt again.  Offered appt with [facility] for Friday, Sept 28 at [place], per [facility].  Pt states this day and time will not work.  Pt states she is off on Oct 8 - and would like to schedule for this day at 9:15am -- appt scheduled.  Pt aware.  Nothing further needed at this time.

## 2010-11-08 NOTE — Progress Notes (Signed)
  Subjective:    Patient ID: Angela Fleming, female    DOB: [birth year], 55 y.o.   MRN: [id number]  HPI Pt left without being seen.    Review of Systems  Constitutional: Negative for fever and unexpected weight change.  HENT: Negative for ear pain, nosebleeds, congestion, sore throat, rhinorrhea, sneezing, trouble swallowing, dental problem, postnasal drip and sinus pressure.   Eyes: Negative for redness and itching.  Respiratory: Positive for shortness of breath. Negative for cough, chest tightness and wheezing.   Cardiovascular: Negative for palpitations and leg swelling.  Gastrointestinal: Negative for nausea and vomiting.  Genitourinary: Negative for dysuria.  Musculoskeletal: Negative for joint swelling.  Skin: Negative for rash.  Neurological: Negative for headaches.  Hematological: Does not bruise/bleed easily.  Psychiatric/Behavioral: Negative for dysphoric mood. The patient is not nervous/anxious.        Objective:   Physical Exam        Assessment & Plan:

## 2010-11-08 NOTE — Progress Notes (Signed)
PFT done today. 

## 2010-11-20 ENCOUNTER — Ambulatory Visit (INDEPENDENT_AMBULATORY_CARE_PROVIDER_SITE_OTHER): Payer: Managed Care, Other (non HMO) | Admitting: Pulmonary Disease

## 2010-11-20 ENCOUNTER — Encounter: Payer: Self-pay | Admitting: Pulmonary Disease

## 2010-11-20 VITALS — BP 108/70 | HR 81 | Temp 98.1°F | Ht 64.0 in | Wt 126.8 lb

## 2010-11-20 DIAGNOSIS — R0600 Dyspnea, unspecified: Secondary | ICD-10-CM

## 2010-11-20 DIAGNOSIS — R0989 Other specified symptoms and signs involving the circulatory and respiratory systems: Secondary | ICD-10-CM

## 2010-11-20 DIAGNOSIS — R0609 Other forms of dyspnea: Secondary | ICD-10-CM

## 2010-11-20 NOTE — Progress Notes (Signed)
  Subjective:    Patient ID: Angela Fleming, female    DOB: [birth year], 55 y.o.   MRN: [id number]  HPI The patient comes in today for followup of her recent pulmonary function studies.  She was found to have no airflow obstruction by spirometry, but did have significant air trapping on lung volumes.  Her DLCO was also normal.  I have reviewed the studies with her in detail, and answered all of her questions.   Review of Systems  Constitutional: Negative for fever and unexpected weight change.  HENT: Negative for ear pain, nosebleeds, congestion, sore throat, rhinorrhea, sneezing, trouble swallowing, dental problem, postnasal drip and sinus pressure.   Eyes: Negative for redness and itching.  Respiratory: Positive for shortness of breath. Negative for cough, chest tightness and wheezing.   Cardiovascular: Negative for palpitations and leg swelling.  Gastrointestinal: Negative for nausea and vomiting.  Genitourinary: Negative for dysuria.  Musculoskeletal: Negative for joint swelling.  Skin: Negative for rash.  Neurological: Negative for headaches.  Hematological: Does not bruise/bleed easily.  Psychiatric/Behavioral: Negative for dysphoric mood. The patient is not nervous/anxious.        Objective:   Physical Exam Well-developed female in no acute distress Nose without purulence or discharge noted Lower extremities without edema, no cyanosis Alert and oriented, moves all 4 extremities.        Assessment & Plan:

## 2010-11-20 NOTE — Assessment & Plan Note (Signed)
The patient has dyspnea that may be due to anxiety or reflux disease, however she does have hyperinflation on her chest x-ray and also air trapping on her lung volumes.  Her spirometry does not show airflow obstruction.  It is really unclear whether she has some type of airways disease, so I would like to give her a trial of a LABA to see if she has subjective improvement.  If she does, I would treat her more for asthma with an inhaled corticosteroid or possibly combination therapy.  If she does not see any difference, more than likely she does not have airways disease to explain her symptomatology.

## 2010-11-20 NOTE — Patient Instructions (Signed)
Trial of arcapta, one inhalation each am for next 3 weeks.  Please call me to review response to treatment.

## 2011-12-13 ENCOUNTER — Ambulatory Visit (HOSPITAL_BASED_OUTPATIENT_CLINIC_OR_DEPARTMENT_OTHER)
Admission: RE | Admit: 2011-12-13 | Discharge: 2011-12-13 | Disposition: A | Discharge status: Home / Self Care | Payer: Managed Care, Other (non HMO) | Source: Ambulatory Visit | Attending: Internal Medicine | Admitting: Internal Medicine

## 2011-12-13 ENCOUNTER — Other Ambulatory Visit (HOSPITAL_BASED_OUTPATIENT_CLINIC_OR_DEPARTMENT_OTHER): Payer: Self-pay | Admitting: Internal Medicine

## 2011-12-13 DIAGNOSIS — Z1231 Encounter for screening mammogram for malignant neoplasm of breast: Secondary | ICD-10-CM | POA: Insufficient documentation

## 2012-02-01 ENCOUNTER — Other Ambulatory Visit: Payer: Self-pay | Admitting: Internal Medicine

## 2012-02-01 DIAGNOSIS — M8580 Other specified disorders of bone density and structure, unspecified site: Secondary | ICD-10-CM

## 2012-02-08 ENCOUNTER — Ambulatory Visit
Admission: RE | Admit: 2012-02-08 | Discharge: 2012-02-08 | Disposition: A | Discharge status: Home / Self Care | Payer: Managed Care, Other (non HMO) | Source: Ambulatory Visit | Attending: Internal Medicine | Admitting: Internal Medicine

## 2012-02-08 DIAGNOSIS — M8580 Other specified disorders of bone density and structure, unspecified site: Secondary | ICD-10-CM

## 2012-11-18 ENCOUNTER — Other Ambulatory Visit: Payer: Self-pay

## 2012-11-18 ENCOUNTER — Other Ambulatory Visit (HOSPITAL_BASED_OUTPATIENT_CLINIC_OR_DEPARTMENT_OTHER): Payer: Self-pay | Admitting: Internal Medicine

## 2012-11-18 DIAGNOSIS — Z1231 Encounter for screening mammogram for malignant neoplasm of breast: Secondary | ICD-10-CM

## 2012-12-15 ENCOUNTER — Ambulatory Visit
Admission: RE | Admit: 2012-12-15 | Discharge: 2012-12-15 | Disposition: A | Discharge status: Home / Self Care | Payer: 59 | Source: Ambulatory Visit

## 2012-12-15 DIAGNOSIS — Z1231 Encounter for screening mammogram for malignant neoplasm of breast: Secondary | ICD-10-CM

## 2013-10-01 ENCOUNTER — Ambulatory Visit
Admission: RE | Admit: 2013-10-01 | Discharge: 2013-10-01 | Disposition: A | Discharge status: Home / Self Care | Payer: 59 | Source: Ambulatory Visit | Attending: Family Medicine | Admitting: Family Medicine

## 2013-10-01 ENCOUNTER — Other Ambulatory Visit: Payer: Self-pay | Admitting: Family Medicine

## 2013-10-01 DIAGNOSIS — M79661 Pain in right lower leg: Secondary | ICD-10-CM

## 2013-10-06 ENCOUNTER — Other Ambulatory Visit: Payer: Self-pay | Admitting: Obstetrics and Gynecology

## 2013-10-07 LAB — CYTOLOGY - PAP

## 2013-12-15 ENCOUNTER — Other Ambulatory Visit: Payer: Self-pay | Admitting: Dermatology

## 2014-07-14 ENCOUNTER — Other Ambulatory Visit: Payer: Self-pay

## 2014-07-14 DIAGNOSIS — Z1231 Encounter for screening mammogram for malignant neoplasm of breast: Secondary | ICD-10-CM

## 2014-08-11 ENCOUNTER — Ambulatory Visit: Payer: 59

## 2014-09-14 ENCOUNTER — Ambulatory Visit
Admission: RE | Admit: 2014-09-14 | Discharge: 2014-09-14 | Disposition: A | Discharge status: Home / Self Care | Payer: Managed Care, Other (non HMO) | Source: Ambulatory Visit

## 2014-09-14 DIAGNOSIS — Z1231 Encounter for screening mammogram for malignant neoplasm of breast: Secondary | ICD-10-CM

## 2016-12-25 ENCOUNTER — Other Ambulatory Visit: Payer: Self-pay | Admitting: Obstetrics and Gynecology

## 2016-12-25 DIAGNOSIS — Z1231 Encounter for screening mammogram for malignant neoplasm of breast: Secondary | ICD-10-CM

## 2017-01-28 ENCOUNTER — Ambulatory Visit
Admission: RE | Admit: 2017-01-28 | Discharge: 2017-01-28 | Disposition: A | Discharge status: Home / Self Care | Payer: 59 | Source: Ambulatory Visit | Attending: Obstetrics and Gynecology | Admitting: Obstetrics and Gynecology

## 2017-01-28 DIAGNOSIS — Z1231 Encounter for screening mammogram for malignant neoplasm of breast: Secondary | ICD-10-CM

## 2017-02-18 ENCOUNTER — Encounter: Payer: Self-pay | Admitting: Genetics

## 2017-03-25 DIAGNOSIS — Z1379 Encounter for other screening for genetic and chromosomal anomalies: Secondary | ICD-10-CM | POA: Insufficient documentation

## 2017-04-04 ENCOUNTER — Other Ambulatory Visit: Payer: 59

## 2017-04-04 ENCOUNTER — Encounter: Payer: 59 | Admitting: Genetics

## 2017-04-10 ENCOUNTER — Other Ambulatory Visit: Payer: Self-pay | Admitting: Obstetrics and Gynecology

## 2017-09-13 ENCOUNTER — Encounter (HOSPITAL_COMMUNITY): Payer: Self-pay | Admitting: Emergency Medicine

## 2017-09-13 ENCOUNTER — Emergency Department (HOSPITAL_COMMUNITY)
Admission: EM | Admit: 2017-09-13 | Discharge: 2017-09-13 | Disposition: A | Discharge status: Home / Self Care | Payer: 59 | Attending: Emergency Medicine | Admitting: Emergency Medicine

## 2017-09-13 ENCOUNTER — Other Ambulatory Visit: Payer: Self-pay

## 2017-09-13 DIAGNOSIS — Z79899 Other long term (current) drug therapy: Secondary | ICD-10-CM | POA: Diagnosis not present

## 2017-09-13 DIAGNOSIS — R1084 Generalized abdominal pain: Secondary | ICD-10-CM | POA: Insufficient documentation

## 2017-09-13 DIAGNOSIS — R197 Diarrhea, unspecified: Secondary | ICD-10-CM | POA: Insufficient documentation

## 2017-09-13 DIAGNOSIS — R112 Nausea with vomiting, unspecified: Secondary | ICD-10-CM | POA: Diagnosis present

## 2017-09-13 LAB — COMPREHENSIVE METABOLIC PANEL
ALK PHOS: 64 U/L (ref 38–126)
ALT: 18 U/L (ref 0–44)
AST: 22 U/L (ref 15–41)
Albumin: 4.5 g/dL (ref 3.5–5.0)
Anion gap: 11 (ref 5–15)
BILIRUBIN TOTAL: 1 mg/dL (ref 0.3–1.2)
BUN: 11 mg/dL (ref 6–20)
CALCIUM: 9.6 mg/dL (ref 8.9–10.3)
CO2: 26 mmol/L (ref 22–32)
CREATININE: 0.78 mg/dL (ref 0.44–1.00)
Chloride: 101 mmol/L (ref 98–111)
Glucose, Bld: 116 mg/dL — ABNORMAL HIGH (ref 70–99)
Potassium: 4.3 mmol/L (ref 3.5–5.1)
Sodium: 138 mmol/L (ref 135–145)
Total Protein: 7.1 g/dL (ref 6.5–8.1)

## 2017-09-13 LAB — URINALYSIS, ROUTINE W REFLEX MICROSCOPIC
Bilirubin Urine: NEGATIVE
GLUCOSE, UA: NEGATIVE mg/dL
Ketones, ur: 80 mg/dL — AB
Leukocytes, UA: NEGATIVE
Nitrite: NEGATIVE
Protein, ur: 30 mg/dL — AB
SPECIFIC GRAVITY, URINE: 1.02 (ref 1.005–1.030)
pH: 5 (ref 5.0–8.0)

## 2017-09-13 LAB — CBC
HEMATOCRIT: 45.4 % (ref 36.0–46.0)
Hemoglobin: 14.7 g/dL (ref 12.0–15.0)
MCH: 28.8 pg (ref 26.0–34.0)
MCHC: 32.4 g/dL (ref 30.0–36.0)
MCV: 88.8 fL (ref 78.0–100.0)
Platelets: 236 10*3/uL (ref 150–400)
RBC: 5.11 MIL/uL (ref 3.87–5.11)
RDW: 12.8 % (ref 11.5–15.5)
WBC: 6.9 10*3/uL (ref 4.0–10.5)

## 2017-09-13 LAB — I-STAT BETA HCG BLOOD, ED (MC, WL, AP ONLY): HCG, QUANTITATIVE: 8.8 m[IU]/mL — AB (ref <5)

## 2017-09-13 LAB — LIPASE, BLOOD: Lipase: 47 U/L (ref 11–51)

## 2017-09-13 LAB — I-STAT TROPONIN, ED: TROPONIN I, POC: 0.05 ng/mL (ref 0.00–0.08)

## 2017-09-13 MED ORDER — ONDANSETRON HCL 4 MG/2ML IJ SOLN: 4.0000 mg | Freq: Once | INTRAMUSCULAR | Status: DC

## 2017-09-13 MED ORDER — DICYCLOMINE HCL 20 MG PO TABS: 20.0000 mg | ORAL_TABLET | Freq: Two times a day (BID) | ORAL | 0 refills | Status: DC

## 2017-09-13 MED ORDER — PROMETHAZINE HCL 25 MG PO TABS
12.5000 mg | ORAL_TABLET | Freq: Once | ORAL | Status: AC
  Administered 2017-09-13: 12.5 mg via ORAL
  Filled 2017-09-13: qty 1

## 2017-09-13 MED ORDER — FAMOTIDINE IN NACL 20-0.9 MG/50ML-% IV SOLN
20.0000 mg | Freq: Once | INTRAVENOUS | Status: AC
  Administered 2017-09-13: 20 mg via INTRAVENOUS
  Filled 2017-09-13: qty 50

## 2017-09-13 MED ORDER — SODIUM CHLORIDE 0.9 % IV BOLUS
1000.0000 mL | Freq: Once | INTRAVENOUS | Status: AC
  Administered 2017-09-13: 1000 mL via INTRAVENOUS

## 2017-09-13 MED ORDER — PROMETHAZINE HCL 25 MG RE SUPP: 25.0000 mg | Freq: Four times a day (QID) | RECTAL | 0 refills | Status: DC | PRN

## 2017-09-13 MED ORDER — GI COCKTAIL ~~LOC~~
30.0000 mL | Freq: Once | ORAL | Status: AC
  Administered 2017-09-13: 30 mL via ORAL
  Filled 2017-09-13: qty 30

## 2017-09-13 NOTE — Discharge Instructions (Addendum)
Try zantac twice a day for the next week.  Try the brat diet bananas rice applesauce and toast.  Return to the emergency department if you are unable to keep anything down if you get a fever or suddenly your pain goes to one spot.

## 2017-09-13 NOTE — ED Triage Notes (Signed)
Patient presents to the ED with complaints of abdominal pain and nausea since Wednesday. Patient reports she feel like she has intergestion burring in the chest with abdominal pain and the umbilicus. Patient reports she was given zofran at PCP yesterday and advised she feel like it makes it worst.

## 2017-09-13 NOTE — ED Notes (Signed)
Discharge instructions and prescriptions discussed with Pt. Pt verbalized understanding. Pt stable and ambulatory.   

## 2017-09-13 NOTE — ED Provider Notes (Signed)
[facility] EMERGENCY DEPARTMENT Provider Note   CSN: [id number] Arrival date & time: 09/13/17  1203     History   Chief Complaint Chief Complaint  Patient presents with  . Abdominal Pain    HPI Angela Fleming is a 62 y.o. female.  62 yo F with a chief complaint of diffuse abdominal pain.  Going on for the past 2 days.  Associated with nausea vomiting and diarrhea.  Denies fevers or chills denies dark or bloody stool denies bilious or bloody emesis.  Starting to have some burning pain in her epigastrium that goes to her neck.  Worse if she eats or drinks anything.  She saw her PCP yesterday who prescribed Zofran.  The patient takes this she feels that her symptoms have been worsening.  Having worsening burning to the epigastrium.  She denies shortness of breath denies exertional symptoms.  Denies recent travel denies suspicious food intake.  Denies other family members with similar symptoms.  The history is provided by the patient and the spouse.  Abdominal Pain   This is a new problem. The current episode started 2 days ago. The problem occurs constantly. The problem has not changed since onset.The pain is associated with eating. The pain is located in the epigastric region. The quality of the pain is dull, aching, colicky, cramping and burning. The pain is at a severity of 7/10. The pain is moderate. Associated symptoms include anorexia, diarrhea, nausea and vomiting. Pertinent negatives include fever, dysuria, headaches, arthralgias and myalgias. Nothing aggravates the symptoms. Nothing relieves the symptoms.    Past Medical History:  Diagnosis Date  . ALLERGIC RHINITIS 01/31/2010  . Anxiety state, unspecified 03/16/2009  . CHANGE IN BOWELS 01/31/2010  . CHICKENPOX, HX OF 03/16/2009  . GERD 03/16/2009  . UNSPECIFIED MYALGIA AND MYOSITIS 01/31/2010    Patient Active Problem List   Diagnosis Date Noted  . Dyspnea 10/20/2010  . ADD (attention deficit disorder)  08/24/2010  . Fatigue 08/24/2010  . Urinary frequency 08/24/2010  . Weight loss 08/24/2010  . ALLERGIC RHINITIS 01/31/2010  . UNSPECIFIED MYALGIA AND MYOSITIS 01/31/2010  . CHANGE IN BOWELS 01/31/2010  . ANXIETY STATE, UNSPECIFIED 03/16/2009  . GERD 03/16/2009    Past Surgical History:  Procedure Laterality Date  . ABDOMINAL HYSTERECTOMY  1992  . BREAST BIOPSY  1979   bilat  . TONSILLECTOMY       OB History   None      Home Medications    Prior to Admission medications   Medication Sig Start Date End Date Taking? Authorizing Provider  ALPRAZolam Duanne Moron) 0.25 MG tablet 1/2 - 1 tab by mouth twice per day as needed 08/24/10   Biagio Borg, MD  calcium gluconate 500 MG tablet Take 500 mg by mouth daily.      [provider]  Cholecalciferol (VITAMIN D) 2000 UNITS CAPS Take 1 capsule by mouth.      [provider]  dicyclomine (BENTYL) 20 MG tablet Take 1 tablet (20 mg total) by mouth 2 (two) times daily. 09/13/17   Deno Etienne, DO  fish oil-omega-3 fatty acids 1000 MG capsule Take 1 capsule by mouth 2 (two) times daily.      [provider]  Multiple Vitamin (MULTIVITAMIN) capsule Take 1 capsule by mouth daily.      [provider]  promethazine (PHENERGAN) 25 MG suppository Place 1 suppository (25 mg total) rectally every 6 (six) hours as needed for nausea or vomiting. 09/13/17  Deno Etienne, DO  vitamin C (ASCORBIC ACID) 500 MG tablet Take 500 mg by mouth 2 (two) times daily.      [provider]    Family History Family History  Problem Relation Age of Onset  . Arthritis Other   . Diabetes Other   . Hypertension Other   . Cancer Other        breast, colon and lung cancer  . Heart disease Other   . Rheum arthritis Maternal Grandmother   . Lupus Maternal Grandmother   . Breast cancer Neg Hx     Social History Social History   Tobacco Use  . Smoking status: Never Smoker  Substance Use Topics  . Alcohol use: Not on file  .  Drug use: Not on file     Allergies   Patient has no known allergies.   Review of Systems Review of Systems  Constitutional: Negative for chills and fever.  HENT: Negative for congestion and rhinorrhea.   Eyes: Negative for redness and visual disturbance.  Respiratory: Negative for shortness of breath and wheezing.   Cardiovascular: Negative for chest pain and palpitations.  Gastrointestinal: Positive for abdominal pain, anorexia, diarrhea, nausea and vomiting.  Genitourinary: Negative for dysuria and urgency.  Musculoskeletal: Negative for arthralgias and myalgias.  Skin: Negative for pallor and wound.  Neurological: Negative for dizziness and headaches.     Physical Exam Updated Vital Signs BP (!) 148/75   Pulse 66   Temp 98.5 F (36.9 C) (Oral)   Resp 13   Ht 5\' 4"  (1.626 m)   Wt 55.8 kg (123 lb)   SpO2 100%   BMI 21.11 kg/m   Physical Exam  Constitutional: She is oriented to person, place, and time. She appears well-developed and well-nourished. No distress.  HENT:  Head: Normocephalic and atraumatic.  Eyes: Pupils are equal, round, and reactive to light. EOM are normal.  Neck: Normal range of motion. Neck supple.  Cardiovascular: Normal rate and regular rhythm. Exam reveals no gallop and no friction rub.  No murmur heard. Pulmonary/Chest: Effort normal. She has no wheezes. She has no rales.  Abdominal: Soft. She exhibits no distension and no mass. There is no tenderness. There is no guarding.  No pain with deep palpation to the right lower and right upper quadrants.  Negative Murphy sign.  Benign exam  Musculoskeletal: She exhibits no edema or tenderness.  Neurological: She is alert and oriented to person, place, and time.  Skin: Skin is warm and dry. She is not diaphoretic.  Psychiatric: She has a normal mood and affect. Her behavior is normal.  Nursing note and vitals reviewed.    ED Treatments / Results  Labs (all labs ordered are listed, but only  abnormal results are displayed) Labs Reviewed  COMPREHENSIVE METABOLIC PANEL - Abnormal; Notable for the following components:      Result Value   Glucose, Bld 116 (*)    All other components within normal limits  URINALYSIS, ROUTINE W REFLEX MICROSCOPIC - Abnormal; Notable for the following components:   Hgb urine dipstick MODERATE (*)    Ketones, ur 80 (*)    Protein, ur 30 (*)    Bacteria, UA RARE (*)    All other components within normal limits  I-STAT BETA HCG BLOOD, ED ([facility], [facility], [facility] ONLY) - Abnormal; Notable for the following components:   I-stat hCG, quantitative 8.8 (*)    All other components within normal limits  LIPASE, BLOOD  CBC  I-STAT TROPONIN,  ED    EKG EKG Interpretation  Date/Time:  Friday September 13 2017 12:54:19 EDT Ventricular Rate:  69 PR Interval:  148 QRS Duration: 80 QT Interval:  402 QTC Calculation: 430 R Axis:   94 Text Interpretation:  Normal sinus rhythm Rightward axis Borderline ECG No old tracing to compare Confirmed by Deno Etienne [phone]) on 09/13/2017 3:39:29 PM   Radiology No results found.  Procedures Procedures (including critical care time)  Medications Ordered in ED Medications  ondansetron (ZOFRAN) injection 4 mg (4 mg Intravenous Not Given 09/13/17 1618)  gi cocktail (Maalox,Lidocaine,Donnatal) (30 mLs Oral Given 09/13/17 1610)  sodium chloride 0.9 % bolus 1,000 mL (0 mLs Intravenous Stopped 09/13/17 1725)  famotidine (PEPCID) IVPB 20 mg premix (0 mg Intravenous Stopped 09/13/17 1708)  promethazine (PHENERGAN) tablet 12.5 mg (12.5 mg Oral Given 09/13/17 1711)     Initial Impression / Assessment and Plan / ED Course  I have reviewed the triage vital signs and the nursing notes.  Pertinent labs & imaging results that were available during my care of the patient were reviewed by me and considered in my medical decision making (see chart for details).     62 yo F with a chief complaint of nausea vomiting and diarrhea.  Going on for the past  couple days.  Not doing well with Zofran at home.  She has a benign abdominal exam for me.  Her labs are reassuring with no leukocytosis no LFT elevation total bilirubin is normal creatinine is very mildly above her baseline, lipase is normal.  Troponin negative.  ECG with no concerning findings.  I doubt that this is a surgical pathology with her exam and lab work.  I discussed this with the patient.  I doubt that imaging would be beneficial.  She does have 80 ketones on UA and so I will give a bolus of fluids and some symptomatic therapy.  Feeling much better on reassessment.  Tolerating PO.  PCP follow up, trial of zantac, given script for phenergan suppositories and bentyl.  7:34 PM:  I have discussed the diagnosis/risks/treatment options with the patient and family and believe the pt to be eligible for discharge home to follow-up with PCP. We also discussed returning to the ED immediately if new or worsening sx occur. We discussed the sx which are most concerning (e.g., sudden worsening pain, fever, inability to tolerate by mouth) that necessitate immediate return. Medications administered to the patient during their visit and any new prescriptions provided to the patient are listed below.  Medications given during this visit Medications  ondansetron (ZOFRAN) injection 4 mg (4 mg Intravenous Not Given 09/13/17 1618)  gi cocktail (Maalox,Lidocaine,Donnatal) (30 mLs Oral Given 09/13/17 1610)  sodium chloride 0.9 % bolus 1,000 mL (0 mLs Intravenous Stopped 09/13/17 1725)  famotidine (PEPCID) IVPB 20 mg premix (0 mg Intravenous Stopped 09/13/17 1708)  promethazine (PHENERGAN) tablet 12.5 mg (12.5 mg Oral Given 09/13/17 1711)      The patient appears reasonably screen and/or stabilized for discharge and I doubt any other medical condition or other [facility] requiring further screening, evaluation, or treatment in the ED at this time prior to discharge.    Final Clinical Impressions(s) / ED Diagnoses   Final  diagnoses:  Generalized abdominal pain  Nausea vomiting and diarrhea    ED Discharge Orders        Ordered    promethazine (PHENERGAN) 25 MG suppository  Every 6 hours PRN     09/13/17 1740  dicyclomine (BENTYL) 20 MG tablet  2 times daily     09/13/17 [place], Blanche Gallien, [place] 09/13/17 1934

## 2017-09-24 ENCOUNTER — Other Ambulatory Visit: Payer: Self-pay | Admitting: Obstetrics and Gynecology

## 2017-09-24 DIAGNOSIS — R590 Localized enlarged lymph nodes: Secondary | ICD-10-CM

## 2017-09-30 ENCOUNTER — Ambulatory Visit
Admission: RE | Admit: 2017-09-30 | Discharge: 2017-09-30 | Disposition: A | Discharge status: Home / Self Care | Payer: 59 | Source: Ambulatory Visit | Attending: Obstetrics and Gynecology | Admitting: Obstetrics and Gynecology

## 2017-09-30 DIAGNOSIS — R590 Localized enlarged lymph nodes: Secondary | ICD-10-CM

## 2018-10-03 ENCOUNTER — Other Ambulatory Visit: Payer: Self-pay | Admitting: Geriatric Medicine

## 2018-10-03 DIAGNOSIS — Z1231 Encounter for screening mammogram for malignant neoplasm of breast: Secondary | ICD-10-CM

## 2018-10-06 ENCOUNTER — Other Ambulatory Visit: Payer: Self-pay | Admitting: Geriatric Medicine

## 2018-10-06 DIAGNOSIS — R1011 Right upper quadrant pain: Secondary | ICD-10-CM

## 2018-10-06 DIAGNOSIS — R1900 Intra-abdominal and pelvic swelling, mass and lump, unspecified site: Secondary | ICD-10-CM

## 2018-10-06 DIAGNOSIS — R110 Nausea: Secondary | ICD-10-CM

## 2018-10-08 ENCOUNTER — Ambulatory Visit
Admission: RE | Admit: 2018-10-08 | Discharge: 2018-10-08 | Disposition: A | Discharge status: Home / Self Care | Payer: 59 | Source: Ambulatory Visit | Attending: Geriatric Medicine | Admitting: Geriatric Medicine

## 2018-10-08 DIAGNOSIS — R1011 Right upper quadrant pain: Secondary | ICD-10-CM

## 2018-10-08 DIAGNOSIS — R110 Nausea: Secondary | ICD-10-CM

## 2018-10-08 DIAGNOSIS — R1900 Intra-abdominal and pelvic swelling, mass and lump, unspecified site: Secondary | ICD-10-CM

## 2018-10-15 ENCOUNTER — Other Ambulatory Visit (HOSPITAL_COMMUNITY): Payer: Self-pay | Admitting: Geriatric Medicine

## 2018-10-15 DIAGNOSIS — R160 Hepatomegaly, not elsewhere classified: Secondary | ICD-10-CM

## 2018-10-16 ENCOUNTER — Other Ambulatory Visit (HOSPITAL_COMMUNITY): Payer: Self-pay | Admitting: Geriatric Medicine

## 2018-10-16 ENCOUNTER — Ambulatory Visit
Admission: RE | Admit: 2018-10-16 | Discharge: 2018-10-16 | Disposition: A | Discharge status: Home / Self Care | Payer: Self-pay | Source: Ambulatory Visit | Attending: Geriatric Medicine | Admitting: Geriatric Medicine

## 2018-10-16 DIAGNOSIS — R52 Pain, unspecified: Secondary | ICD-10-CM

## 2018-10-22 ENCOUNTER — Other Ambulatory Visit: Payer: Self-pay | Admitting: Geriatric Medicine

## 2018-10-22 DIAGNOSIS — R1310 Dysphagia, unspecified: Secondary | ICD-10-CM

## 2018-10-22 DIAGNOSIS — R1319 Other dysphagia: Secondary | ICD-10-CM

## 2018-10-24 ENCOUNTER — Ambulatory Visit
Admission: RE | Admit: 2018-10-24 | Discharge: 2018-10-24 | Disposition: A | Discharge status: Home / Self Care | Payer: 59 | Source: Ambulatory Visit | Attending: Geriatric Medicine | Admitting: Geriatric Medicine

## 2018-10-24 ENCOUNTER — Ambulatory Visit (HOSPITAL_COMMUNITY): Payer: 59

## 2018-10-24 DIAGNOSIS — R1310 Dysphagia, unspecified: Secondary | ICD-10-CM

## 2018-10-24 DIAGNOSIS — R1319 Other dysphagia: Secondary | ICD-10-CM

## 2018-10-27 ENCOUNTER — Ambulatory Visit
Admission: RE | Admit: 2018-10-27 | Discharge: 2018-10-27 | Disposition: A | Discharge status: Home / Self Care | Payer: 59 | Source: Ambulatory Visit | Attending: Geriatric Medicine | Admitting: Geriatric Medicine

## 2018-10-27 ENCOUNTER — Other Ambulatory Visit: Payer: Self-pay

## 2018-10-27 DIAGNOSIS — Z1231 Encounter for screening mammogram for malignant neoplasm of breast: Secondary | ICD-10-CM

## 2018-10-28 ENCOUNTER — Other Ambulatory Visit: Payer: Self-pay | Admitting: Radiology

## 2018-10-30 ENCOUNTER — Encounter (HOSPITAL_COMMUNITY): Payer: Self-pay

## 2018-10-30 ENCOUNTER — Ambulatory Visit (HOSPITAL_COMMUNITY): Admission: RE | Admit: 2018-10-30 | Payer: 59 | Source: Ambulatory Visit

## 2018-10-30 ENCOUNTER — Other Ambulatory Visit (HOSPITAL_COMMUNITY): Payer: 59

## 2018-11-28 ENCOUNTER — Encounter: Payer: Self-pay | Admitting: *Deleted

## 2018-11-28 ENCOUNTER — Telehealth: Payer: Self-pay | Admitting: *Deleted

## 2018-11-28 DIAGNOSIS — C221 Intrahepatic bile duct carcinoma: Secondary | ICD-10-CM | POA: Insufficient documentation

## 2018-11-28 NOTE — Telephone Encounter (Signed)
Dr. Felipa Eth has sent referral on this patient asking Dr. Benay Spice to see her for new diagnosis of cholangiocarcinoma. Diagnosis at [facility] where she had scans and biopsy of liver mass. Forwarded this message to GI Navigator, Dawn and new patient scheduler, Seth Bake.

## 2018-11-28 NOTE — Progress Notes (Signed)
Reached out to Demetrios Loll to introduce myself as the office RN Navigator and explain our new patient process. Reviewed the reason for their referral and scheduled their new patient appointment along with labs. Provided address and directions to the office including call back phone number. Reviewed with patient any concerns they may have or any possible barriers to attending their appointment.   Patient expressed her concern that the original referral was sent to Dr Benay Spice after she had obtained a few recommendations from other doctors. Let her know that the referral was forwarded to [place] because our location was closer. She asked that I check with Dr Benay Spice and his navigator to see if she could be seen by him. Message sent. At this time the wait for new patient appointment for Dr Benay Spice is about 10 days. Patient okay with making an appointment here. She also has an appointment at [facility] on 12/08/18 for a second opinion.   Informed patient about my role as a navigator and that I will meet with them prior to their New Patient appointment and more fully discuss what services I can provide. At this time patient has no further questions or needs.

## 2018-12-01 ENCOUNTER — Other Ambulatory Visit: Payer: Self-pay | Admitting: Hematology

## 2018-12-01 DIAGNOSIS — C221 Intrahepatic bile duct carcinoma: Secondary | ICD-10-CM

## 2018-12-01 NOTE — Progress Notes (Deleted)
[place] CONSULT NOTE  Patient Care Team: Lajean Manes, MD as PCP - General (Internal Medicine)  HEME/ONC OVERVIEW: 1. Stage II (cT2N0M0) intrahepatic cholangiocarcinoma, unresectable  -10/2018: two distinct R hepatic lobe masses on MRI abdomen, bx-proven cholangiocarcinoma, unresectable; no mets on CT chest   TREATMENT SUMMARY:  TBD   ASSESSMENT & PLAN:   Stage II (cT2N0M0) intrahepatic cholangiocarcinoma, unresectable  -I reviewed the patient's records in detail, including external hematology/oncology clinic notes from [facility], lab studies, imaging results and pathology reports -I also independently reviewed the radiologic images of MRI abdomen/pelvis and abdominal US, and agree with the findings as documented  -In summary, patient noticed a hard lump in her stomach in late 09/2018, for which an abdominal ultrasound was done and showed a large multilobulated mass within the R hepatic lobe.  She then underwent MRI of the abdomen, which showed 2 distinct masses within the right hepatic lobe, one measuring 4.6 x 3.5 x 4.2 cm, and the second 2.8 x 2.5 x 1.9 cm.  There was no evidence of capsular or vascular invasion.  Patient went to [facility] for further evaluation, and biopsy of the hepatic mass showed intrahepatic cholangiocarcinoma.  Due to the presence of separate tumors, the multidisciplinary tumor board there recommended against surgical resection and to start systemic therapy.  Patient returns to [facility] to establish care. -I reviewed the imaging and pathology results in detail with the patient -I have also reviewed the NCCN guideline detail the patient -In the setting of unresectable intrahepatic cholangiocarcinoma, the mainstay treatment is systemic therapy, either in the form of standard-of-care chemotherapy or clinical trial -Patient is scheduled for another opinion at [facility] oncology, including possible  clinical trial -Due to her last staging scan being done over 1 month ago, I have ordered PET to rule out any evidence of new or metastatic disease  -If she is not a candidate for clinical trial, then the first line standard-of-care treatment is gemcitabine/cisplatin or gemcitabine/Abraxne.  The goal of treatment would be for palliative intent only, not curative.  -As she had her biopsy done at [facility], we will reach out to them to obtain tissue blocks so that we can send molecular studies, including MMR -In light of the patient's extensive family hx of malignancy, including breast and pancreatic cancers, I have also referred the patient to genetics for further evaluation of any hereditary syndrome  -In anticipation of chemotherapy, I have also ordered port placement to facilitate treatment   No orders of the defined types were placed in this encounter.   A total of more than {CHL ONC TIME VISIT - EKCMK:[id number]} were spent face-to-face with the patient during this encounter and over half of that time was spent on counseling and coordination of care as outlined above.    All questions were answered. The patient knows to call the clinic with any problems, questions or concerns.  Tish Men, MD 12/01/2018 12:41 PM   CHIEF COMPLAINTS/PURPOSE OF CONSULTATION:  "I am here for ***"  HISTORY OF PRESENTING ILLNESS:  Angela Fleming 63 y.o. female is here because of newly diagnosed intrahepatic cholangiocarcinoma.  Patient first noticed a hard lump in her stomach in late 09/2018, for which an abdominal ultrasound was done and showed a large multilobulated mass within the R hepatic lobe.  She then underwent MRI of the abdomen, which showed 2 distinct masses within the right hepatic lobe, one measuring 4.6 x 3.5 x 4.2 cm,  and the second 2.8 x 2.5 x 1.9 cm.  There was no evidence of capsular or vascular invasion.  Patient went to [facility] for further evaluation, and biopsy of  the hepatic mass showed intrahepatic cholangiocarcinoma.  Due to the presence of separate tumors, the multidisciplinary tumor board there recommended against surgical resection and to start systemic therapy.  Patient returns to [facility] to establish care.  Of note, the patient has an extensive family history of malignancy, including sister with breast cancer, maternal aunt with breast cancer, and maternal aunt with pancreatic cancer.   REVIEW OF SYSTEMS:   Constitutional: ( - ) fevers, ( - )  chills , ( - ) night sweats Eyes: ( - ) blurriness of vision, ( - ) double vision, ( - ) watery eyes Ears, nose, mouth, throat, and face: ( - ) mucositis, ( - ) sore throat Respiratory: ( - ) cough, ( - ) dyspnea, ( - ) wheezes Cardiovascular: ( - ) palpitation, ( - ) chest discomfort, ( - ) lower extremity swelling Gastrointestinal:  ( - ) nausea, ( - ) heartburn, ( - ) change in bowel habits Skin: ( - ) abnormal skin rashes Lymphatics: ( - ) new lymphadenopathy, ( - ) easy bruising Neurological: ( - ) numbness, ( - ) tingling, ( - ) new weaknesses Behavioral/Psych: ( - ) mood change, ( - ) new changes  All other systems were reviewed with the patient and are negative.  I have reviewed her chart and materials related to her cancer extensively and collaborated history with the patient. Summary of oncologic history is as follows: Oncology History  Intrahepatic cholangiocarcinoma ([place])  10/08/2018 Imaging   Abdominal US: IMPRESSION: 1. Heterogeneous multilobulated mass within the right hepatic lobe, in total measuring 8.2 x 6.1 x 6.5 cm. It is uncertain whether this represents 1 large multilobulated mass versus multiple adjacent masses. Primary and metastatic malignancy are in the differential. Further evaluation with multi phasic hepatic protocol CT or MRI is recommended for further evaluation. 2. Biliary sludge.   10/14/2018 Imaging   MRI abdomen:  FINDINGS:  Liver: The liver is  within normal limits measuring 18 cm in length. Within the inferior right hepatic lobe, there are 2 ill-defined T2 hyperintense lesions; postcontrast these appear to be solid and demonstrate progressively increasing enhancement. The  larger lesion is more inferior and measures 4.8 x 4.5 x 4.1 cm, the more superior smaller lesion measures 3.0 x 2.0 x 2.5 cm (coronal image 44 series 31). There are multiple additional subcentimeter subtle T2 hyperintense lesions, which are too small  accurately characterize. No biliary dilatation. Intrahepatic vessels are patent.  Gallbladder: No stones or wall thickening. Pancreas: No mass or inflammation. Spleen: Unremarkable. Adrenal glands: Unremarkable. Kidneys: Right renal cyst. Both kidneys are otherwise unremarkable without hydronephrosis.  GI: No bowel obstruction. No abnormal bowel wall thickening. Peritoneum/retroperitoneum: No ascites or adenopathy. Normal caliber vessels. MSK: Unremarkable bone marrow signal.  IMPRESSION:  Enhancing liver lesions concerning for malignancy. Recommend biopsy   10/28/2018 Imaging   Review of MRI abdomen from 10/14/2018 at [place]:  Outside study performed on 10/13/2018 presented for second opinion.  Right lobe of the liver contains two well-circumscribed lesions measuring 2.5 x 2.8 x 1.9 cm and  3.5 x 4.6 x 4.2 cm minimally enhancing and minimally T2 hyperintense showing diffusion restriction. No evidence of capsular invasion.  Imaging findings are  most suggestive of malignancy. Recommend biopsy.   11/04/2018  Imaging   CT chest w/o contrast: IMPRESSION:  1. Left upper lobe apicoposterior segmental 6 mm groundglass nodule, favored to represent atypical adenomatous hyperplasia. 2. No convincing evidence of metastatic disease in the chest.   11/10/2018 Pathology Results   Liver Mass, Right Lobe (Biopsy):  -Moderately differentiated adenocarcinoma, consistent with  cholangiocarcinoma.  Note: Immunostains demonstrate that the neoplastic cells are positive for CK7, focally positive for CK20, and negative for Gata-3 and p40, TTF-1, and Napsin (performed as a multiplex). In situ hybridization for albumin is strongly positive, supporting the diagnosis of cholangiocarcinoma.  This case was shown at the Daily Quality Assurance Conference.   11/28/2018 Initial Diagnosis   Cholangiocarcinoma ([place])   12/01/2018 Cancer Staging   Staging form: Intrahepatic Bile Duct, AJCC 8th Edition - Clinical stage from 12/01/2018: Stage II (cT2, cN0, cM0) - Signed by Tish Men, MD on 12/01/2018     MEDICAL HISTORY:  Past Medical History:  Diagnosis Date  . ALLERGIC RHINITIS 01/31/2010  . Anxiety state, unspecified 03/16/2009  . CHANGE IN BOWELS 01/31/2010  . CHICKENPOX, HX OF 03/16/2009  . GERD 03/16/2009  . UNSPECIFIED MYALGIA AND MYOSITIS 01/31/2010    SURGICAL HISTORY: Past Surgical History:  Procedure Laterality Date  . ABDOMINAL HYSTERECTOMY  1992  . BREAST BIOPSY  1979   bilat  . TONSILLECTOMY      SOCIAL HISTORY: Social History   Socioeconomic History  . Marital status: Married    Spouse name: Not on file  . Number of children: Not on file  . Years of education: Not on file  . Highest education level: Not on file  Occupational History  . Occupation: Facilities manager: [employer]  . Financial resource strain: Not on file  . Food insecurity    Worry: Not on file    Inability: Not on file  . Transportation needs    Medical: Not on file    Non-medical: Not on file  Tobacco Use  . Smoking status: Never Smoker  Substance and Sexual Activity  . Alcohol use: Not on file  . Drug use: Not on file  . Sexual activity: Not on file  Lifestyle  . Physical activity    Days per week: Not on file    Minutes per session: Not on file  . Stress: Not on file  Relationships  . Social Herbalist on phone: Not on file    Gets  together: Not on file    Attends religious service: Not on file    Active member of club or organization: Not on file    Attends meetings of clubs or organizations: Not on file    Relationship status: Not on file  . Intimate partner violence    Fear of current or ex partner: Not on file    Emotionally abused: Not on file    Physically abused: Not on file    Forced sexual activity: Not on file  Other Topics Concern  . Not on file  Social History Narrative  . Not on file    FAMILY HISTORY: Family History  Problem Relation Age of Onset  . Arthritis Other   . Diabetes Other   . Hypertension Other   . Cancer Other        breast, colon and lung cancer  . Heart disease Other   . Rheum arthritis Maternal Grandmother   . Lupus Maternal Grandmother   . Breast cancer Neg Hx  ALLERGIES:  has No Known Allergies.  MEDICATIONS:  Current Outpatient Medications  Medication Sig Dispense Refill  . ALPRAZolam (XANAX) 0.25 MG tablet 1/2 - 1 tab by mouth twice per day as needed 60 tablet 0  . calcium gluconate 500 MG tablet Take 500 mg by mouth daily.      . Cholecalciferol (VITAMIN D) 2000 UNITS CAPS Take 1 capsule by mouth.      . dicyclomine (BENTYL) 20 MG tablet Take 1 tablet (20 mg total) by mouth 2 (two) times daily. 20 tablet 0  . fish oil-omega-3 fatty acids 1000 MG capsule Take 1 capsule by mouth 2 (two) times daily.      . Multiple Vitamin (MULTIVITAMIN) capsule Take 1 capsule by mouth daily.      . promethazine (PHENERGAN) 25 MG suppository Place 1 suppository (25 mg total) rectally every 6 (six) hours as needed for nausea or vomiting. 12 each 0  . vitamin C (ASCORBIC ACID) 500 MG tablet Take 500 mg by mouth 2 (two) times daily.       No current facility-administered medications for this visit.     PHYSICAL EXAMINATION: ECOG PERFORMANCE STATUS: {CHL ONC ECOG PS:[phone]}  There were no vitals filed for this visit. There were no vitals filed for this visit.  GENERAL:  alert, no distress and comfortable SKIN: skin color, texture, turgor are normal, no rashes or significant lesions EYES: conjunctiva are pink and non-injected, sclera clear OROPHARYNX: no exudate, no erythema; lips, buccal mucosa, and tongue normal  NECK: supple, non-tender LYMPH:  no palpable lymphadenopathy in the cervical LUNGS: clear to auscultation with normal breathing effort HEART: regular rate & rhythm, no murmurs, no lower extremity edema ABDOMEN: soft, non-tender, non-distended, normal bowel sounds Musculoskeletal: no cyanosis of digits and no clubbing  PSYCH: alert & oriented x 3, fluent speech NEURO: no focal motor/sensory deficits  LABORATORY DATA:  I have reviewed the data as listed Lab Results  Component Value Date   WBC 6.9 09/13/2017   HGB 14.7 09/13/2017   HCT 45.4 09/13/2017   MCV 88.8 09/13/2017   PLT 236 09/13/2017   Lab Results  Component Value Date   NA 138 09/13/2017   K 4.3 09/13/2017   CL 101 09/13/2017   CO2 26 09/13/2017    RADIOGRAPHIC STUDIES: I have personally reviewed the radiological images as listed and agreed with the findings in the report. No results found.  PATHOLOGY: I have reviewed the pathology reports as documented in the oncologist history.

## 2018-12-02 ENCOUNTER — Encounter: Payer: Self-pay | Admitting: *Deleted

## 2018-12-02 ENCOUNTER — Ambulatory Visit: Payer: 59 | Admitting: Hematology

## 2018-12-02 ENCOUNTER — Other Ambulatory Visit: Payer: 59

## 2018-12-02 NOTE — Progress Notes (Signed)
Patient calling with several questions as to what to expect at today's appointment. During the conversation, she asked about whether or not there was an oncologist in the area which incorporated christian faith into their medical care. We spoke about Dr Marin Olp and his using faith in addition to medical management. Patient requests that she be seen by Dr Marin Olp. He is out of the office this week, but patient is okay delaying her new patient appointment until later next week after her appointment at [facility] because she feels like she'll have more information, but also, because the faith incorporation is important to her.  Appointment rescheduled to next week. Dr Maylon Peppers notified of patient preference. Will follow up with patient next week during her new patient appointment.

## 2018-12-09 ENCOUNTER — Encounter: Payer: Self-pay | Admitting: *Deleted

## 2018-12-09 NOTE — Progress Notes (Signed)
Received a call from the patient this morning. She was seen by Angela Fanny Skates yesterday at [facility]. The doctor stated she had an established working relationship with Angela Fleming, so recommended that she follow the first referral and see Angela Fleming. Patient is okay with the possible 10-14 day wait for a new patient appointment.   Patient would like to cancel her appointments here at [facility] and see Angela Fleming at the [facility] location. Appointments cancelled and message sent to GI Navigator and both MDs.

## 2018-12-10 ENCOUNTER — Inpatient Hospital Stay: Payer: 59 | Admitting: Hematology & Oncology

## 2018-12-10 ENCOUNTER — Inpatient Hospital Stay: Payer: 59

## 2018-12-12 ENCOUNTER — Telehealth: Payer: Self-pay | Admitting: Oncology

## 2018-12-12 NOTE — Telephone Encounter (Signed)
Patient returned call. Per forwarded staff message from GI navigator moved 11/12 new patient appointment to 11/5. Confirmed with patient.

## 2018-12-18 ENCOUNTER — Other Ambulatory Visit: Payer: Self-pay

## 2018-12-18 ENCOUNTER — Inpatient Hospital Stay: Payer: No Typology Code available for payment source | Attending: Oncology | Admitting: Oncology

## 2018-12-18 VITALS — BP 131/73 | HR 68 | Temp 98.9°F | Resp 17 | Ht 64.0 in | Wt 109.4 lb

## 2018-12-18 DIAGNOSIS — Z800 Family history of malignant neoplasm of digestive organs: Secondary | ICD-10-CM | POA: Diagnosis not present

## 2018-12-18 DIAGNOSIS — Z23 Encounter for immunization: Secondary | ICD-10-CM

## 2018-12-18 DIAGNOSIS — Z7189 Other specified counseling: Secondary | ICD-10-CM | POA: Diagnosis not present

## 2018-12-18 DIAGNOSIS — R634 Abnormal weight loss: Secondary | ICD-10-CM

## 2018-12-18 DIAGNOSIS — C221 Intrahepatic bile duct carcinoma: Secondary | ICD-10-CM | POA: Diagnosis not present

## 2018-12-18 DIAGNOSIS — Z808 Family history of malignant neoplasm of other organs or systems: Secondary | ICD-10-CM | POA: Insufficient documentation

## 2018-12-18 DIAGNOSIS — M818 Other osteoporosis without current pathological fracture: Secondary | ICD-10-CM | POA: Insufficient documentation

## 2018-12-18 DIAGNOSIS — Z801 Family history of malignant neoplasm of trachea, bronchus and lung: Secondary | ICD-10-CM

## 2018-12-18 DIAGNOSIS — Z5111 Encounter for antineoplastic chemotherapy: Secondary | ICD-10-CM | POA: Insufficient documentation

## 2018-12-18 DIAGNOSIS — Z803 Family history of malignant neoplasm of breast: Secondary | ICD-10-CM | POA: Insufficient documentation

## 2018-12-18 MED ORDER — INFLUENZA VAC SPLIT QUAD 0.5 ML IM SUSY: 0.5000 mL | PREFILLED_SYRINGE | Freq: Once | INTRAMUSCULAR | Status: DC

## 2018-12-18 MED ORDER — LIDOCAINE-PRILOCAINE 2.5-2.5 % EX CREA: 1.0000 "application " | TOPICAL_CREAM | CUTANEOUS | 2 refills | Status: DC

## 2018-12-18 NOTE — Progress Notes (Signed)
Angela Fleming Patient Consult   Requesting MD: Lajean Manes, [place] [address] [place],  [place] [address]   Nandita Lightle 63 y.o.  Jun 20, 1955    Reason for Consult: Cholangiocarcinoma   HPI: Ms. Nwankwo noted a fullness in the right abdomen August.  She was seen at an urgent care and referred for an ultrasound on 10/08/2018.  This confirmed a hypoechoic mass in the right liver.  The mass measured 8.2 x 6.1 x 6.5 cm.  She was referred for an MRI of the abdomen at [facility] 10/14/2006.  2 ill-defined lesions were noted in the inferior right liver.  The larger lesion measured 4.8 x 4.5 x 4.1 cm.  Anterior lesion measures 3 x 2 x 2.5 cm.  Multiple additional subcentimeter subtle T2 hyperintense lesions were noted.  No ascites or adenopathy.  She was referred to [facility].  A CT of the chest on 10/15/2018 revealed a 6 mm left upper lobe nodule favored to represent atypical adenomatous hyperplasia.  She underwent a biopsy of the right liver on 11/10/2018.  This confirmed a moderately differentiated adenocarcinoma consistent with cholangiocarcinoma.  The tumor cells are positive for cytokeratin 7, focally positive for cytokeratin 20, negative for Gata-3, P 40, TTF-1, and Napsin.  In situ hybridization for albumin was positive.  Dr. Percell Miller felt she was not a surgical candidate.  Systemic therapy was recommended.  NGS testing was recommended.  She saw Dr. Fanny Skates at [facility].  Systemic therapy with gemcitabine/cisplatin was recommended.  She was also seen for surgical consultation at [facility].  Baseline CTs on 12/12/2018 revealed a prominent left supraclavicular node measuring 0.6 x 1.9 cm.  No suspicious pulmonary nodules.  A mass measuring 6.6 x 5.6 x 8.6 cm was noted in segments 4/8 with adjacent satellite lesions.  A lesion was noted in the posterior medial hepatic dome measuring 2.2 x 1.9 cm.  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  12 mm periportal lymph node and a 10  mm portacaval node was seen.  She began treatment with gemcitabine/cisplatin on 12/12/2018.  She reports tolerated chemotherapy well.  She is scheduled for day 8 chemotherapy tomorrow.  Ms. Muraco lives in [place] and would like to receive future chemotherapy here.  Past Medical History:  Diagnosis Date   ALLERGIC RHINITIS 01/31/2010   Anxiety state, unspecified 03/16/2009   CHANGE IN BOWELS 01/31/2010   CHICKENPOX, HX OF 03/16/2009   GERD 03/16/2009   UNSPECIFIED MYALGIA AND MYOSITIS 01/31/2010    .  Intrahepatic cholangiocarcinoma       .  G5, P3, 1 AB, 1 MC                                                                                     11/10/2018  Past Surgical History:  Procedure Laterality Date   ABDOMINAL HYSTERECTOMY  1992   BREAST BIOPSY  1979   bilat   TONSILLECTOMY     .  Left oophorectomy   .  Excision of a breast cyst   .  Osteoporosis Medications: Reviewed  Allergies: No Known Allergies  Family history: Her sister had breast cancer  at age 33.  She also had melanoma.  Maternal aunt had breast cancer.  Maternal aunt had pancreas cancer and melanoma.  Paternal uncle had appendiceal cancer.  Her maternal grandmother had lung cancer was a non-smoker.  Her grandfather had a cancer in late light-he believes this may have been rectal cancer.  Social History:   She lives with her husband in [place].  She is an Therapist, sports.  She does not use alcohol or cigarettes.  No transfusion history.  No risk factor for HIV or hepatitis.  She has been a blood donor.  ROS:   Positives include: 20 pound weight loss, fullness in the right abdomen, nausea prior to receiving chemotherapy  A complete ROS was otherwise negative.  Physical Exam:  Blood pressure 131/73, pulse 68, temperature 98.9 F (37.2 C), temperature source Temporal, resp. rate 17, height 5\' 4"  (1.626 m), weight 109 lb 6.4 oz (49.6 kg), SpO2 99 %.  HEENT: Neck without mass Lungs: Clear bilaterally Cardiac: Regular  rate and rhythm Abdomen: No splenomegaly.  The liver edge is palpable in the right subcostal region.  No apparent ascites.  Vascular: No leg edema Lymph nodes: No cervical, supraclavicular, or inguinal nodes.  "Shotty "bilateral axillary nodes Neurologic: Alert and oriented, the motor exam appears grossly intact in the upper and lower extremities bilaterally Skin: No rash Musculoskeletal: No spine tenderness  Right upper chest Port-A-Cath site with a resolving ecchymosis   LAB:  CBC  Lab Results  Component Value Date   WBC 6.9 09/13/2017   HGB 14.7 09/13/2017   HCT 45.4 09/13/2017   MCV 88.8 09/13/2017   PLT 236 09/13/2017   NEUTROABS 3.4 08/24/2010        CMP  Lab Results  Component Value Date   NA 138 09/13/2017   K 4.3 09/13/2017   CL 101 09/13/2017   CO2 26 09/13/2017   GLUCOSE 116 (H) 09/13/2017   BUN 11 09/13/2017   CREATININE 0.78 09/13/2017   CALCIUM 9.6 09/13/2017   PROT 7.1 09/13/2017   ALBUMIN 4.5 09/13/2017   AST 22 09/13/2017   ALT 18 09/13/2017   ALKPHOS 64 09/13/2017   BILITOT 1.0 09/13/2017   GFRNONAA >60 09/13/2017   GFRAA >60 09/13/2017      Imaging: As per HPI, images not available for review   Assessment/Plan:   1. Intrahepatic cholangiocarcinoma  Abdominal ultrasound 10/08/2018-heterogenous right hepatic mass  MRI abdomen 10/14/2018-2 enhancing right liver lesions, 4.8 x 4.5 x 4.1 cm, more superior lesion measured 3 x 2 x 2.5 cm, multiple additional subcentimeter T2 hyperintense lesions-too small to characterize.  CT chest 11/04/2018-6 mm left upper lobe groundglass nodule, no evidence of metastatic disease in the chest  Biopsy of right liver lesion at [facility] 11/10/2018-moderately differentiated adenocarcinoma consistent with cholangiocarcinoma  CTs at [facility] 12/12/2018-right hepatic lobe masses, satellite lesions adjacent to the dominant mass, prominent periportal and aortocaval lymph nodes prominent left supraclavicular  node  Cycle 1 gemcitabine/cisplatin at [facility] on 12/12/2018 2. Family history of multiple cancers eluding breast, pancreatic, melanoma, appendiceal cancer, lung cancer 3. Osteoporosis   Disposition:   Ms. Alers has been diagnosed with intrahepatic cholangiocarcinoma.  She appears to have a dominant mass with satellite lesions.  She is not a resection candidate at present.  Ms. Klass understands no therapy will be curative.  She has completed 1 treatment with gemcitabine/cisplatin.  She tolerated the chemotherapy well.  She is scheduled to receive day 8 chemotherapy at [facility] tomorrow.  The plan is  to complete 3 cycles of gemcitabine/cisplatin prior to a restaging CT evaluation.  She will follow up on NGS testing obtained at [facility] and for the results of [place].  She has a family history of multiple cancers.  We will make a referral to the genetics counselor.  I reviewed potential toxicities associated with the gemcitabine/cisplatin regimen including the chance of nausea/vomiting, alopecia, and hematologic toxicity.  We discussed the renal toxicity and neuropathy associated with cisplatin.  We reviewed the fever, rash, and pneumonitis associated with gemcitabine.  She received chemotherapy here beginning with cycle 2 on 01/02/2019.  She will be scheduled for an office visit that day.  [name], MD  12/18/2018, 3:33 PM

## 2018-12-18 NOTE — Progress Notes (Signed)
START OFF PATHWAY REGIMEN - Other   [id number]:Cisplatin 25 mg/m2 D1,8 + Gemcitabine 1,000 mg/m2 D1,8 q21 Days:   A cycle is every 21 days:     Gemcitabine      Cisplatin   **Always confirm dose/schedule in your pharmacy ordering system**  Patient Characteristics: Intent of Therapy: Non-Curative / Palliative Intent, Discussed with Patient 

## 2018-12-19 ENCOUNTER — Telehealth: Payer: Self-pay | Admitting: Oncology

## 2018-12-19 NOTE — Telephone Encounter (Signed)
Confirmed November appointments with patient. Per patient chemo already started at [facility] so no ched class needed/requested.

## 2018-12-24 ENCOUNTER — Telehealth: Payer: Self-pay | Admitting: Oncology

## 2018-12-24 ENCOUNTER — Telehealth: Payer: Self-pay | Admitting: Genetic Counselor

## 2018-12-24 ENCOUNTER — Telehealth: Payer: Self-pay | Admitting: *Deleted

## 2018-12-24 NOTE — Telephone Encounter (Signed)
Asking what her appointment for lab/flush was tomorrow? Informed her it is for labs if determined necessary by genetics appointment. She is asking if she should still come tomorrow, since her husband has had a covid exposure at work and has been tested with no results yet? Informed her she should not come in tomorrow. Can have her genetics visit as WebEx and can always draw the labs at a later date. She understands and agrees.

## 2018-12-24 NOTE — Telephone Encounter (Signed)
Called patient regarding Webex appointment, screener complete and e-mail has been sent.

## 2018-12-24 NOTE — Telephone Encounter (Signed)
Returned patient's phone call regarding 11/12 appointments, per patient's request Genetics appointment has been cancelled. Transferred patient to speak with providers nurse about upcoming appointments.

## 2018-12-24 NOTE — Telephone Encounter (Signed)
Called patient regarding upcoming Webex appointment, per patient's request this is a walk-in visit.

## 2018-12-25 ENCOUNTER — Ambulatory Visit: Payer: 59 | Admitting: Oncology

## 2018-12-25 ENCOUNTER — Inpatient Hospital Stay (HOSPITAL_BASED_OUTPATIENT_CLINIC_OR_DEPARTMENT_OTHER): Payer: No Typology Code available for payment source | Admitting: Genetic Counselor

## 2018-12-25 ENCOUNTER — Other Ambulatory Visit: Payer: 59

## 2018-12-25 ENCOUNTER — Encounter: Payer: Self-pay | Admitting: Genetic Counselor

## 2018-12-25 DIAGNOSIS — Z1379 Encounter for other screening for genetic and chromosomal anomalies: Secondary | ICD-10-CM | POA: Diagnosis not present

## 2018-12-25 DIAGNOSIS — C221 Intrahepatic bile duct carcinoma: Secondary | ICD-10-CM

## 2018-12-25 DIAGNOSIS — Z803 Family history of malignant neoplasm of breast: Secondary | ICD-10-CM | POA: Insufficient documentation

## 2018-12-25 DIAGNOSIS — Z800 Family history of malignant neoplasm of digestive organs: Secondary | ICD-10-CM | POA: Insufficient documentation

## 2018-12-25 DIAGNOSIS — Z801 Family history of malignant neoplasm of trachea, bronchus and lung: Secondary | ICD-10-CM | POA: Insufficient documentation

## 2018-12-25 DIAGNOSIS — Z808 Family history of malignant neoplasm of other organs or systems: Secondary | ICD-10-CM | POA: Insufficient documentation

## 2018-12-25 NOTE — Progress Notes (Signed)
REFERRING PROVIDER: Ladell Pier, MD [address] 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 Genetic testing   3. Family history of breast cancer   4. Family history of melanoma   5. Family history of pancreatic cancer   6. Family history of colon cancer   7. Family history of lung cancer      I connected with Angela Fleming on 12/25/2018 at 1:00 pm EDT by Webex video conference and verified that I am speaking with the correct person using two identifiers.   Patient location: home Provider location: office  HISTORY OF PRESENT ILLNESS:   Angela Fleming, a 64 y.o. female, was seen for a  cancer genetics consultation at the request of Dr. Benay Spice due to a personal history of cholangiocarcinoma and a family history of melanoma, breast, pancreatic, colon, and appendiceal cancers.  Angela Fleming presents to clinic today to discuss the possibility of a hereditary predisposition to cancer, genetic testing, and to further clarify her future cancer risks, as well as potential cancer risks for family members.   In 2020, at the age of 12, Angela Fleming was diagnosed with cholangiocarcinoma.   Angela Fleming previously had genetic testing performed through her OBGYN, Dr. Helane Rima, in February of 2019. Genetic testing was reported through the [facility] panel, and was negative. These results are discussed in more detail below.   CANCER HISTORY:  Oncology History  Intrahepatic cholangiocarcinoma ([place])  03/25/2017 Genetic Testing   Negative genetic testing: No pathogenic variants detected on the [facility] panel, per patient. The report date is 03/25/2017 and was ordered by Dr. Helane Rima.   The [facility] gene panel offered by [facility] includes sequencing and deletion/duplication testing of the following 35 genes: APC, ATM, AXIN2, BARD1, BMPR1A, BRCA1, BRCA2, BRIP1, CHD1, CDK4,  CDKN2A, CHEK2, EPCAM (large rearrangement only), HOXB13, (sequencing only), GALNT12, MLH1, MSH2, MSH3 (excluding repetitive portions of exon 1), MSH6, MUTYH, NBN, NTHL1, PALB2, PMS2, PTEN, RAD51C, RAD51D, RNF43, RPS20, SMAD4, STK11, and TP53. Sequencing was performed for select regions of POLE and POLD1, and large rearrangement analysis was performed for select regions of GREM1.    10/08/2018 Imaging   Abdominal US: IMPRESSION: 1. Heterogeneous multilobulated mass within the right hepatic lobe, in total measuring 8.2 x 6.1 x 6.5 cm. It is uncertain whether this represents 1 large multilobulated mass versus multiple adjacent masses. Primary and metastatic malignancy are in the differential. Further evaluation with multi phasic hepatic protocol CT or MRI is recommended for further evaluation. 2. Biliary sludge.   10/14/2018 Imaging   MRI abdomen:  FINDINGS:  Liver: The liver is within normal limits measuring 18 cm in length. Within the inferior right hepatic lobe, there are 2 ill-defined T2 hyperintense lesions; postcontrast these appear to be solid and demonstrate progressively increasing enhancement. The  larger lesion is more inferior and measures 4.8 x 4.5 x 4.1 cm, the more superior smaller lesion measures 3.0 x 2.0 x 2.5 cm (coronal image 44 series 31). There are multiple additional subcentimeter subtle T2 hyperintense lesions, which are too small  accurately characterize. No biliary dilatation. Intrahepatic vessels are patent.  Gallbladder: No stones or wall thickening. Pancreas: No mass or inflammation. Spleen: Unremarkable. Adrenal glands: Unremarkable. Kidneys: Right renal cyst. Both kidneys are otherwise unremarkable without hydronephrosis.  GI: No bowel obstruction. No abnormal bowel wall thickening. Peritoneum/retroperitoneum: No ascites or adenopathy. Normal caliber vessels. MSK: Unremarkable bone  marrow signal.  IMPRESSION:  Enhancing liver lesions concerning for malignancy.  Recommend biopsy   10/28/2018 Imaging   Review of MRI abdomen from 10/14/2018 at [facility]:  Outside study performed on 10/13/2018 presented for second opinion.  Right lobe of the liver contains two well-circumscribed lesions measuring 2.5 x 2.8 x 1.9 cm and  3.5 x 4.6 x 4.2 cm minimally enhancing and minimally T2 hyperintense showing diffusion restriction. No evidence of capsular invasion.  Imaging findings are  most suggestive of malignancy. Recommend biopsy.   11/04/2018 Imaging   CT chest w/o contrast: IMPRESSION:  1. Left upper lobe apicoposterior segmental 6 mm groundglass nodule, favored to represent atypical adenomatous hyperplasia. 2. No convincing evidence of metastatic disease in the chest.   11/10/2018 Pathology Results   Liver Mass, Right Lobe (Biopsy):  -Moderately differentiated adenocarcinoma, consistent with cholangiocarcinoma.  Note: Immunostains demonstrate that the neoplastic cells are positive for CK7, focally positive for CK20, and negative for Gata-3 and p40, TTF-1, and Napsin (performed as a multiplex). In situ hybridization for albumin is strongly positive, supporting the diagnosis of cholangiocarcinoma.  This case was shown at the Daily Quality Assurance Conference.   11/28/2018 Initial Diagnosis   Cholangiocarcinoma ([place])   12/01/2018 Cancer Staging   Staging form: Intrahepatic Bile Duct, AJCC 8th Edition - Clinical stage from 12/01/2018: Stage II (cT2, cN0, cM0) - Signed by Tish Men, MD on 12/01/2018   01/02/2019 -  Chemotherapy   The patient had palonosetron (ALOXI) injection 0.25 mg, 0.25 mg, Intravenous,  Once, 0 of 4 cycles pegfilgrastim-jmdb (FULPHILA) injection 6 mg, 6 mg, Subcutaneous,  Once, 0 of 4 cycles CISplatin (PLATINOL) 38 mg in sodium chloride 0.9 % 250 mL chemo infusion, 25 mg/m2, Intravenous,  Once, 0 of 4 cycles gemcitabine (GEMZAR) 1,482 mg in sodium chloride 0.9 % 250 mL chemo infusion, 1,000 mg/m2, Intravenous,   Once, 0 of 4 cycles fosaprepitant (EMEND) 150 mg, dexamethasone (DECADRON) 12 mg in sodium chloride 0.9 % 145 mL IVPB, , Intravenous,  Once, 0 of 4 cycles  for chemotherapy treatment.       RISK FACTORS:  Menarche was at age 73.  First live birth at age 51.  OCP use for approximately 0 years.  Ovaries intact: no, left ovary removed in 2019.  Hysterectomy: yes, age [age].  Menopausal status: postmenopausal.  HRT use: 0 years. Colonoscopy: yes; a couple of polyps each time, less than 10 total per patient.. Mammogram within the last year: yes. Number of breast biopsies: 3. Any excessive radiation exposure in the past: no  Past Medical History:  Diagnosis Date   ALLERGIC RHINITIS 01/31/2010   Anxiety state, unspecified 03/16/2009   CHANGE IN BOWELS 01/31/2010   CHICKENPOX, HX OF 03/16/2009   Family history of breast cancer    Family history of colon cancer    Family history of lung cancer    Family history of melanoma    Family history of pancreatic cancer    GERD 03/16/2009   UNSPECIFIED MYALGIA AND MYOSITIS 01/31/2010    Past Surgical History:  Procedure Laterality Date   ABDOMINAL HYSTERECTOMY  1992   BREAST BIOPSY  1979   bilat   TONSILLECTOMY      Social History   Socioeconomic History   Marital status: Married    Spouse name: Not on file   Number of children: Not on file   Years of education: Not on file   Highest education level: Not on file  Occupational History  Occupation: Facilities manager: Water quality scientist strain: Not on file   Food insecurity    Worry: Not on file    Inability: Not on file   Transportation needs    Medical: Not on file    Non-medical: Not on file  Tobacco Use   Smoking status: Never Smoker  Substance and Sexual Activity   Alcohol use: Not on file   Drug use: Not on file   Sexual activity: Not on file  Lifestyle   Physical activity    Days per week: Not on file     Minutes per session: Not on file   Stress: Not on file  Relationships   Social connections    Talks on phone: Not on file    Gets together: Not on file    Attends religious service: Not on file    Active member of club or organization: Not on file    Attends meetings of clubs or organizations: Not on file    Relationship status: Not on file  Other Topics Concern   Not on file  Social History Narrative   Not on file     FAMILY HISTORY:  We obtained a detailed, 4-generation family history.  Significant diagnoses are listed below: Family History  Problem Relation Age of Onset   Arthritis Other    Diabetes Other    Hypertension Other    Cancer Other        breast, colon and lung cancer   Heart disease Other    Rheum arthritis Maternal Grandmother    Lupus Maternal Grandmother    Breast cancer Sister 18       negative genetic testing   Melanoma Sister        dx. in her 57s   Breast cancer Maternal Aunt        dx. late 50s/60s, 2 breast cancers in same breast, negative genetic testing   Cancer Paternal Uncle 83       appendix   Stroke Maternal Grandfather    Lung cancer Paternal Grandmother 79       may have started as bresat cancer   Colon cancer Paternal Grandfather        diagnosed in his early 71s   Pancreatic cancer Maternal Aunt 58   Melanoma Maternal Aunt    Angela Fleming has one daughter (age 41) and two sons (age 67 and 47) who have not had cancer. She has two sisters and one brother in their 64s. One sister has had breast cancer at age 64 and melanoma in her 2s. This sister also had genetic testing that was negative (report not available for review).   Angela Fleming mother is currently 69 and has not had cancer. She has three maternal aunts. One aunt died at birth, one is living but had breast cancer twice in the same breast (believed to be two separate primaries) in her late 54s or 2s. This aunt had genetic testing that was negative (report not  available for review). Her other aunt died from pancreatic cancer at age 77, and also had melanoma around the same time. Her maternal grandmother died at age 83 and her maternal grandfather died at age 14, and neither had cancer.  Angela Fleming father died at age 63 and did not have cancer. She has one paternal uncle who was recently diagnosed with appendiceal cancer at age 35. Her paternal grandmother had lung cancer that was diagnosed  at age 62, although this cancer may have started in the breast. Her maternal grandfather had colon cancer diagnosed in his early 49s.   Angela Fleming is aware of previous family history of negative genetic testing for hereditary cancer risks in her sister and maternal aunt.   GENETIC TEST RESULTS: Genetic testing reported out on 03/25/2017 through the [facility] cancer panel found no pathogenic variants. The [facility] gene panel offered by [employer] includes sequencing and deletion/duplication testing of the following 35 genes: APC, ATM, AXIN2, BARD1, BMPR1A, BRCA1, BRCA2, BRIP1, CHD1, CDK4, CDKN2A, CHEK2, EPCAM (large rearrangement only), HOXB13, (sequencing only), GALNT12, MLH1, MSH2, MSH3 (excluding repetitive portions of exon 1), MSH6, MUTYH, NBN, NTHL1, PALB2, PMS2, PTEN, RAD51C, RAD51D, RNF43, RPS20, SMAD4, STK11, and TP53. Sequencing was performed for select regions of POLE and POLD1, and large rearrangement analysis was performed for select regions of GREM1.  We do not have a copy of these results, although Angela Fleming has her own copy and was able to verbally read her results and details of the testing during our virtual appointment.   DISCUSSION: Approximately 5 - 10% of cancer is hereditary.  Most cases of hereditary cholangiocarcinoma are associated with Lynch syndrome, a condition that mainly increases the risk for colorectal and endometrial cancer, among other cancer types.  Of note, the Lynch syndrome genes (MLH1, MSH2, MSH6, PMS2, and EPCAM) were  included in Angela Fleming previous genetic test and were normal.  Most cases of hereditary breast cancer are associated with the BRCA genes, but other breast cancer genes include ATM, CHEK2, PMS2, etc.  These genes were also included in her test, and were normal.    We discussed with Angela Fleming that because current genetic testing is not perfect, it is possible there may be a gene mutation in one of these genes that current testing cannot detect, but that chance is small.  We also discussed that there could be another gene that has not yet been discovered, or that we have not yet tested, that is responsible for the cancer diagnoses in the family. It is also possible there is a hereditary cause for the cancer in the family that Angela Fleming did not inherit and therefore was not identified in her testing, although this likelihood is lowered based on her family members' negative genetic test results.   ADDITIONAL GENETIC TESTING:  After reviewing the characteristics, features, and inheritance patterns of hereditary cancer syndromes, we discussed with Angela Fleming that there are other genes that are associated with increased cancer risk that can be analyzed.  In particular, there are a few genes associated with pancreatic cancer that were not included in her original testing (PRSS1, SPINK1, CFTR).  We discussed that there is a relatively low likelihood that these genes would be positive, as she has already had negative testing for the most common causes of hereditary pancreatic cancer.  We also discussed that the results of additional germline testing would not alter the treatment of her current cancer.  Should Angela Fleming wish to pursue additional genetic testing, we are happy to discuss and coordinate this testing at any time.    CANCER SCREENING RECOMMENDATIONS:  Angela Fleming test result is considered negative (normal).  This means that we have not identified a hereditary cause for her personal and family history of cancer  at this time. Most cancers happen by chance and this negative test suggests that her cancer may fall into this category.    While reassuring, this does not  definitively rule out a hereditary predisposition to cancer. It is still possible that there could be genetic mutations that are undetectable by current technology. There could be genetic mutations in genes that have not been tested or identified to increase cancer risk.  Therefore, it is recommended she continue to follow the cancer management and screening guidelines provided by her oncology and primary healthcare provider.   An individual's cancer risk and medical management are not determined by genetic test results alone. Overall cancer risk assessment incorporates additional factors, including personal medical history, family history, and any available genetic information that may result in a personalized plan for cancer prevention and surveillance.  RECOMMENDATIONS FOR FAMILY MEMBERS:  Individuals in this family might be at some increased risk of developing cancer, over the general population risk, simply due to the family history of cancer.  We recommended women in this family have a yearly mammogram beginning at age 77, or 71 years younger than the earliest onset of cancer, an annual clinical breast exam, and perform monthly breast self-exams. Women in this family should also have a gynecological exam as recommended by their primary provider. All family members should have a colonoscopy by age 48.  PLAN:  Angela Fleming did not wish to pursue additional genetic testing at today's visit. We understand this decision and remain available to coordinate genetic testing at any time in the future. We, therefore, recommend Angela Fleming continue to follow the cancer screening guidelines given by her primary healthcare provider.  Ms. Wentzel questions were answered to her satisfaction today. Our contact information was provided should additional questions or  concerns arise. Thank you for the referral and allowing [place] to share in the care of your patient.   Clint Guy, MS, [facility] Certified Genetic Counselor [email or web address] Phone: [phone]  The patient was seen for a total of 45 minutes in face-to-face genetic counseling.  This patient was discussed with Drs. Magrinat, Lindi Adie and/or Burr Medico who agrees with the above.    _______________________________________________________________________ For Office Staff:  Number of people involved in session: 1 Was an Intern/ student involved with case: no

## 2018-12-28 ENCOUNTER — Other Ambulatory Visit: Payer: Self-pay | Admitting: Oncology

## 2019-01-02 ENCOUNTER — Inpatient Hospital Stay: Payer: No Typology Code available for payment source

## 2019-01-02 ENCOUNTER — Inpatient Hospital Stay: Payer: No Typology Code available for payment source | Admitting: Nutrition

## 2019-01-02 ENCOUNTER — Inpatient Hospital Stay (HOSPITAL_BASED_OUTPATIENT_CLINIC_OR_DEPARTMENT_OTHER): Payer: No Typology Code available for payment source | Admitting: Oncology

## 2019-01-02 ENCOUNTER — Other Ambulatory Visit: Payer: Self-pay

## 2019-01-02 ENCOUNTER — Encounter: Payer: Self-pay | Admitting: Oncology

## 2019-01-02 VITALS — BP 113/68 | HR 74 | Temp 98.0°F | Resp 18 | Ht 64.0 in | Wt 109.3 lb

## 2019-01-02 DIAGNOSIS — C221 Intrahepatic bile duct carcinoma: Secondary | ICD-10-CM | POA: Diagnosis not present

## 2019-01-02 DIAGNOSIS — Z800 Family history of malignant neoplasm of digestive organs: Secondary | ICD-10-CM | POA: Diagnosis not present

## 2019-01-02 DIAGNOSIS — Z803 Family history of malignant neoplasm of breast: Secondary | ICD-10-CM | POA: Diagnosis not present

## 2019-01-02 DIAGNOSIS — Z801 Family history of malignant neoplasm of trachea, bronchus and lung: Secondary | ICD-10-CM | POA: Diagnosis not present

## 2019-01-02 DIAGNOSIS — Z95828 Presence of other vascular implants and grafts: Secondary | ICD-10-CM

## 2019-01-02 DIAGNOSIS — M818 Other osteoporosis without current pathological fracture: Secondary | ICD-10-CM | POA: Diagnosis not present

## 2019-01-02 DIAGNOSIS — Z808 Family history of malignant neoplasm of other organs or systems: Secondary | ICD-10-CM | POA: Diagnosis not present

## 2019-01-02 DIAGNOSIS — Z5111 Encounter for antineoplastic chemotherapy: Secondary | ICD-10-CM | POA: Diagnosis not present

## 2019-01-02 LAB — CMP (CANCER CENTER ONLY)
ALT: 44 U/L (ref 0–44)
AST: 29 U/L (ref 15–41)
Albumin: 4.3 g/dL (ref 3.5–5.0)
Alkaline Phosphatase: 180 U/L — ABNORMAL HIGH (ref 38–126)
Anion gap: 8 (ref 5–15)
BUN: 12 mg/dL (ref 8–23)
CO2: 26 mmol/L (ref 22–32)
Calcium: 9.4 mg/dL (ref 8.9–10.3)
Chloride: 103 mmol/L (ref 98–111)
Creatinine: 0.63 mg/dL (ref 0.44–1.00)
GFR, Est AFR Am: >60 mL/min (ref >60)
GFR, Estimated: >60 mL/min (ref >60)
Glucose, Bld: 99 mg/dL (ref 70–99)
Potassium: 4.4 mmol/L (ref 3.5–5.1)
Sodium: 137 mmol/L (ref 135–145)
Total Bilirubin: 0.4 mg/dL (ref 0.3–1.2)
Total Protein: 7 g/dL (ref 6.5–8.1)

## 2019-01-02 LAB — CBC WITH DIFFERENTIAL (CANCER CENTER ONLY)
Abs Immature Granulocytes: 0.02 10*3/uL (ref 0.00–0.07)
Basophils Absolute: 0 10*3/uL (ref 0.0–0.1)
Basophils Relative: 0 %
Eosinophils Absolute: 0.1 10*3/uL (ref 0.0–0.5)
Eosinophils Relative: 1 %
HCT: 35.6 % — ABNORMAL LOW (ref 36.0–46.0)
Hemoglobin: 12 g/dL (ref 12.0–15.0)
Immature Granulocytes: 0 %
Lymphocytes Relative: 29 %
Lymphs Abs: 1.4 10*3/uL (ref 0.7–4.0)
MCH: 28.7 pg (ref 26.0–34.0)
MCHC: 33.7 g/dL (ref 30.0–36.0)
MCV: 85.2 fL (ref 80.0–100.0)
Monocytes Absolute: 0.5 10*3/uL (ref 0.1–1.0)
Monocytes Relative: 10 %
Neutro Abs: 2.9 10*3/uL (ref 1.7–7.7)
Neutrophils Relative %: 60 %
Platelet Count: 422 10*3/uL — ABNORMAL HIGH (ref 150–400)
RBC: 4.18 MIL/uL (ref 3.87–5.11)
RDW: 13.2 % (ref 11.5–15.5)
WBC Count: 4.9 10*3/uL (ref 4.0–10.5)
nRBC: 0 % (ref 0.0–0.2)

## 2019-01-02 LAB — CEA (IN HOUSE-CHCC): CEA (CHCC-In House): 1.02 ng/mL (ref 0.00–5.00)

## 2019-01-02 LAB — MAGNESIUM: Magnesium: 1.8 mg/dL (ref 1.7–2.4)

## 2019-01-02 MED ORDER — PALONOSETRON HCL INJECTION 0.25 MG/5ML
0.2500 mg | Freq: Once | INTRAVENOUS | Status: AC
  Administered 2019-01-02: 0.25 mg via INTRAVENOUS

## 2019-01-02 MED ORDER — SODIUM CHLORIDE 0.9 % IV SOLN
Freq: Once | INTRAVENOUS | Status: AC
  Administered 2019-01-02: 10:00:00 via INTRAVENOUS
  Filled 2019-01-02: qty 250

## 2019-01-02 MED ORDER — HEPARIN SOD (PORK) LOCK FLUSH 100 UNIT/ML IV SOLN
500.0000 [IU] | Freq: Once | INTRAVENOUS | Status: AC | PRN
  Administered 2019-01-02: 500 [IU]
  Filled 2019-01-02: qty 5

## 2019-01-02 MED ORDER — PALONOSETRON HCL INJECTION 0.25 MG/5ML
INTRAVENOUS | Status: AC
  Filled 2019-01-02: qty 5

## 2019-01-02 MED ORDER — SODIUM CHLORIDE 0.9% FLUSH
10.0000 mL | INTRAVENOUS | Status: DC | PRN
  Administered 2019-01-02: 10 mL via INTRAVENOUS
  Filled 2019-01-02: qty 10

## 2019-01-02 MED ORDER — SODIUM CHLORIDE 0.9 % IV SOLN
Freq: Once | INTRAVENOUS | Status: AC
  Administered 2019-01-02: 12:00:00 via INTRAVENOUS
  Filled 2019-01-02: qty 5

## 2019-01-02 MED ORDER — SODIUM CHLORIDE 0.9% FLUSH
10.0000 mL | INTRAVENOUS | Status: DC | PRN
  Administered 2019-01-02: 10 mL
  Filled 2019-01-02: qty 10

## 2019-01-02 MED ORDER — SODIUM CHLORIDE 0.9 % IV SOLN
1000.0000 mg/m2 | Freq: Once | INTRAVENOUS | Status: AC
  Administered 2019-01-02: 1482 mg via INTRAVENOUS
  Filled 2019-01-02: qty 38.98

## 2019-01-02 MED ORDER — SODIUM CHLORIDE 0.9 % IV SOLN
25.0000 mg/m2 | Freq: Once | INTRAVENOUS | Status: AC
  Administered 2019-01-02: 38 mg via INTRAVENOUS
  Filled 2019-01-02: qty 38

## 2019-01-02 MED ORDER — POTASSIUM CHLORIDE 2 MEQ/ML IV SOLN
Freq: Once | INTRAVENOUS | Status: AC
  Administered 2019-01-02: 10:00:00 via INTRAVENOUS
  Filled 2019-01-02: qty 10

## 2019-01-02 NOTE — Progress Notes (Signed)
  [facility] OFFICE PROGRESS NOTE   Diagnosis: Cholangiocarcinoma  INTERVAL HISTORY:   Angela Fleming completed day 8 gemcitabine/cisplatin at [facility] on 12/19/2018.  She reports tolerating the chemotherapy well.  No fever, rash, or neuropathy symptoms.  She had mild nausea on approximately day 7, relieved with Zofran.  No abdominal pain.  She has noted less fullness in the abdomen.  Objective:  Vital signs in last 24 hours:  Blood pressure 113/68, pulse 74, temperature 98 F (36.7 C), temperature source Temporal, resp. rate 18, height 5\' 4"  (1.626 m), weight 109 lb 4.8 oz (49.6 kg), SpO2 100 %.   Limited physical examination secondary to distancing with the Covid pandemic HEENT: No thrush or ulcers GI: Nontender, the liver edge is palpable in the right lateral subcostal region Vascular: No leg edema  Skin: No rash  Portacath/PICC-without erythema  Lab Results:  Lab Results  Component Value Date   WBC 4.9 01/02/2019   HGB 12.0 01/02/2019   HCT 35.6 (L) 01/02/2019   MCV 85.2 01/02/2019   PLT 422 (H) 01/02/2019   NEUTROABS 2.9 01/02/2019    CMP  Lab Results  Component Value Date   NA 138 09/13/2017   K 4.3 09/13/2017   CL 101 09/13/2017   CO2 26 09/13/2017   GLUCOSE 116 (H) 09/13/2017   BUN 11 09/13/2017   CREATININE 0.78 09/13/2017   CALCIUM 9.6 09/13/2017   PROT 7.1 09/13/2017   ALBUMIN 4.5 09/13/2017   AST 22 09/13/2017   ALT 18 09/13/2017   ALKPHOS 64 09/13/2017   BILITOT 1.0 09/13/2017   GFRNONAA >60 09/13/2017   GFRAA >60 09/13/2017     Medications: I have reviewed the patient's current medications.   Assessment/Plan: 1. Intrahepatic cholangiocarcinoma  Abdominal ultrasound 10/08/2018-heterogenous right hepatic mass  MRI abdomen 10/14/2018-2 enhancing right liver lesions, 4.8 x 4.5 x 4.1 cm, more superior lesion measured 3 x 2 x 2.5 cm, multiple additional subcentimeter T2 hyperintense lesions-too small to characterize.  CT chest 11/04/2018-6  mm left upper lobe groundglass nodule, no evidence of metastatic disease in the chest  Biopsy of right liver lesion at [facility] 11/10/2018-moderately differentiated adenocarcinoma consistent with cholangiocarcinoma  CTs at [facility] 12/12/2018-right hepatic lobe masses, satellite lesions adjacent to the dominant mass, prominent periportal and aortocaval lymph nodes prominent left supraclavicular node  Cycle 1 gemcitabine/cisplatin at [facility] on 12/12/2018  Cycle 2 gemcitabine/cisplatin 01/02/2019 2. Family history of multiple cancers eluding breast, pancreatic, melanoma, appendiceal cancer, lung cancer 3. Osteoporosis    Disposition: Angela Fleming tolerated the first cycle of gemcitabine/cisplatin well.  She will complete cycle 2 beginning today.  She will return for an office visit and day 8 chemotherapy on 01/09/2019.  The plan is for restaging CTs at [facility] after cycle 3.  [name], MD  01/02/2019  9:00 AM

## 2019-01-02 NOTE — Patient Instructions (Signed)
[facility] Discharge Instructions for Patients Receiving Chemotherapy  Today you received the following chemotherapy agents Gemzar, Cisplatin  To help prevent nausea and vomiting after your treatment, we encourage you to take your nausea medication as directed   If you develop nausea and vomiting that is not controlled by your nausea medication, call the clinic.   BELOW ARE SYMPTOMS THAT SHOULD BE REPORTED IMMEDIATELY:  *FEVER GREATER THAN 100.5 F  *CHILLS WITH OR WITHOUT FEVER  NAUSEA AND VOMITING THAT IS NOT CONTROLLED WITH YOUR NAUSEA MEDICATION  *UNUSUAL SHORTNESS OF BREATH  *UNUSUAL BRUISING OR BLEEDING  TENDERNESS IN MOUTH AND THROAT WITH OR WITHOUT PRESENCE OF ULCERS  *URINARY PROBLEMS  *BOWEL PROBLEMS  UNUSUAL RASH Items with * indicate a potential emergency and should be followed up as soon as possible.  Feel free to call the clinic should you have any questions or concerns. The clinic phone number is [phone].  Please show the CHEMO ALERT CARD at check-in to the Emergency Department and triage nurse.   

## 2019-01-02 NOTE — Progress Notes (Signed)
Met with patient in infusion to introduce myself as Arboriculturist and to offer available resources.  Discussed one-time $58 Engineer, drilling to assist with personal expenses while going through treatment.  Gave her my card if interested in applying and for any additional financial questions or concerns.

## 2019-01-02 NOTE — Progress Notes (Signed)
Patient was identified to be at risk for malnutrition on the MST secondary to poor appetite and weight loss.  63 year old female diagnosed with cholangiocarcinoma receiving cycle 2 of cisplatin and gemcitabine.  She is a patient of Dr. Julieanne Manson.  Past medical history includes osteoporosis, liver disease, GERD.  Medications include Decadron, Zofran, Compazine.  Labs were reviewed.  Height: 5 feet 4 inches. Weight: 109.3 pounds November 20. Usual body weight: 120-125 pounds per patient. BMI: 18.76.  Patient reports she has had some nausea and endorses weight loss. She tries to eat healthy foods but has been craving some foods that are not on her normal diet. Patient has been consuming a smoothie made with protein powder. She would prefer to incorporate healthier oral nutrition supplements.  Nutrition diagnosis: Unintended weight loss related to cholangiocarcinoma and associated treatments as evidenced by 12% weight loss from usual body weight.  Action: Patient educated to increase calories and protein in small frequent meals and snacks. Educated on strategies for improving nausea. Educated patient about healthier oral nutrition supplements and provided suggestions. Provided fact sheets.  Questions were answered.  Contact information provided.  Monitoring, evaluation, goals: Patient will tolerate increased calories and protein to minimize further weight loss.  Next visit: To be scheduled as needed.  **Disclaimer: This note was dictated with voice recognition software. Similar sounding words can inadvertently be transcribed and this note may contain transcription errors which may not have been corrected upon publication of note.**

## 2019-01-03 LAB — CANCER ANTIGEN 19-9: CA 19-9: 5 U/mL (ref 0–35)

## 2019-01-04 ENCOUNTER — Other Ambulatory Visit: Payer: Self-pay | Admitting: Oncology

## 2019-01-05 ENCOUNTER — Telehealth: Payer: Self-pay | Admitting: *Deleted

## 2019-01-05 NOTE — Telephone Encounter (Signed)
Called to request the remaining appointments for 11/27 be scheduled and asking if she can come in sooner in the day? Sent message to scheduling w/her request.

## 2019-01-09 ENCOUNTER — Inpatient Hospital Stay: Payer: No Typology Code available for payment source

## 2019-01-09 ENCOUNTER — Other Ambulatory Visit: Payer: Self-pay

## 2019-01-09 ENCOUNTER — Inpatient Hospital Stay (HOSPITAL_BASED_OUTPATIENT_CLINIC_OR_DEPARTMENT_OTHER): Payer: No Typology Code available for payment source | Admitting: Oncology

## 2019-01-09 VITALS — BP 128/77 | HR 80 | Temp 98.5°F | Resp 18 | Ht 64.0 in | Wt 109.8 lb

## 2019-01-09 DIAGNOSIS — C221 Intrahepatic bile duct carcinoma: Secondary | ICD-10-CM

## 2019-01-09 DIAGNOSIS — Z5111 Encounter for antineoplastic chemotherapy: Secondary | ICD-10-CM | POA: Diagnosis not present

## 2019-01-09 DIAGNOSIS — Z95828 Presence of other vascular implants and grafts: Secondary | ICD-10-CM

## 2019-01-09 LAB — CMP (CANCER CENTER ONLY)
ALT: 40 U/L (ref 0–44)
AST: 28 U/L (ref 15–41)
Albumin: 4.1 g/dL (ref 3.5–5.0)
Alkaline Phosphatase: 166 U/L — ABNORMAL HIGH (ref 38–126)
Anion gap: 10 (ref 5–15)
BUN: 14 mg/dL (ref 8–23)
CO2: 22 mmol/L (ref 22–32)
Calcium: 8.9 mg/dL (ref 8.9–10.3)
Chloride: 103 mmol/L (ref 98–111)
Creatinine: 0.62 mg/dL (ref 0.44–1.00)
GFR, Est AFR Am: >60 mL/min (ref >60)
GFR, Estimated: >60 mL/min (ref >60)
Glucose, Bld: 94 mg/dL (ref 70–99)
Potassium: 4.3 mmol/L (ref 3.5–5.1)
Sodium: 135 mmol/L (ref 135–145)
Total Bilirubin: 0.3 mg/dL (ref 0.3–1.2)
Total Protein: 6.7 g/dL (ref 6.5–8.1)

## 2019-01-09 LAB — CBC WITH DIFFERENTIAL (CANCER CENTER ONLY)
Abs Immature Granulocytes: 0.34 10*3/uL — ABNORMAL HIGH (ref 0.00–0.07)
Basophils Absolute: 0 10*3/uL (ref 0.0–0.1)
Basophils Relative: 1 %
Eosinophils Absolute: 0 10*3/uL (ref 0.0–0.5)
Eosinophils Relative: 1 %
HCT: 34.5 % — ABNORMAL LOW (ref 36.0–46.0)
Hemoglobin: 11.4 g/dL — ABNORMAL LOW (ref 12.0–15.0)
Immature Granulocytes: 8 %
Lymphocytes Relative: 41 %
Lymphs Abs: 1.8 10*3/uL (ref 0.7–4.0)
MCH: 28.4 pg (ref 26.0–34.0)
MCHC: 33 g/dL (ref 30.0–36.0)
MCV: 85.8 fL (ref 80.0–100.0)
Monocytes Absolute: 0.3 10*3/uL (ref 0.1–1.0)
Monocytes Relative: 7 %
Neutro Abs: 1.9 10*3/uL (ref 1.7–7.7)
Neutrophils Relative %: 42 %
Platelet Count: 337 10*3/uL (ref 150–400)
RBC: 4.02 MIL/uL (ref 3.87–5.11)
RDW: 13.2 % (ref 11.5–15.5)
WBC Count: 4.4 10*3/uL (ref 4.0–10.5)
nRBC: 0 % (ref 0.0–0.2)

## 2019-01-09 LAB — MAGNESIUM: Magnesium: 1.8 mg/dL (ref 1.7–2.4)

## 2019-01-09 MED ORDER — SODIUM CHLORIDE 0.9% FLUSH
10.0000 mL | INTRAVENOUS | Status: DC | PRN
  Administered 2019-01-09: 10 mL
  Filled 2019-01-09: qty 10

## 2019-01-09 MED ORDER — SODIUM CHLORIDE 0.9 % IV SOLN
1000.0000 mg/m2 | Freq: Once | INTRAVENOUS | Status: AC
  Administered 2019-01-09: 1482 mg via INTRAVENOUS
  Filled 2019-01-09: qty 38.98

## 2019-01-09 MED ORDER — PALONOSETRON HCL INJECTION 0.25 MG/5ML
0.2500 mg | Freq: Once | INTRAVENOUS | Status: AC
  Administered 2019-01-09: 0.25 mg via INTRAVENOUS

## 2019-01-09 MED ORDER — PALONOSETRON HCL INJECTION 0.25 MG/5ML
INTRAVENOUS | Status: AC
  Filled 2019-01-09: qty 5

## 2019-01-09 MED ORDER — HEPARIN SOD (PORK) LOCK FLUSH 100 UNIT/ML IV SOLN
500.0000 [IU] | Freq: Once | INTRAVENOUS | Status: AC | PRN
  Administered 2019-01-09: 500 [IU]
  Filled 2019-01-09: qty 5

## 2019-01-09 MED ORDER — SODIUM CHLORIDE 0.9 % IV SOLN
Freq: Once | INTRAVENOUS | Status: AC
  Administered 2019-01-09: 12:00:00 via INTRAVENOUS
  Filled 2019-01-09: qty 5

## 2019-01-09 MED ORDER — SODIUM CHLORIDE 0.9 % IV SOLN
Freq: Once | INTRAVENOUS | Status: AC
  Administered 2019-01-09: 10:00:00 via INTRAVENOUS
  Filled 2019-01-09: qty 250

## 2019-01-09 MED ORDER — SODIUM CHLORIDE 0.9 % IV SOLN
25.0000 mg/m2 | Freq: Once | INTRAVENOUS | Status: AC
  Administered 2019-01-09: 38 mg via INTRAVENOUS
  Filled 2019-01-09: qty 38

## 2019-01-09 MED ORDER — POTASSIUM CHLORIDE 2 MEQ/ML IV SOLN
Freq: Once | INTRAVENOUS | Status: AC
  Administered 2019-01-09: 10:00:00 via INTRAVENOUS
  Filled 2019-01-09: qty 10

## 2019-01-09 MED ORDER — SODIUM CHLORIDE 0.9% FLUSH
10.0000 mL | INTRAVENOUS | Status: DC | PRN
  Administered 2019-01-09: 08:00:00 10 mL
  Filled 2019-01-09: qty 10

## 2019-01-09 NOTE — Progress Notes (Signed)
  [place] OFFICE PROGRESS NOTE   Diagnosis: Cholangiocarcinoma  INTERVAL HISTORY:   Angela Fleming completed day 1 of cycle 2 gemcitabine/cisplatin on 01/02/2019.  No nausea, fever, rash, or neuropathy symptoms.  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  Objective:  Vital signs in last 24 hours:  Blood pressure 128/77, pulse 80, temperature 98.5 F (36.9 C), temperature source Temporal, resp. rate 18, height _0  (1.626 m), weight 109 lb 12.8 oz (49.8 kg), SpO2 100 %.    HEENT: No thrush or ulcers GI: The liver edge is palpable in the lateral right subcostal region, no splenomegaly Vascular: No leg edema    Portacath/PICC-without erythema  Lab Results:  Lab Results  Component Value Date   WBC 4.4 01/09/2019   HGB 11.4 (L) 01/09/2019   HCT 34.5 (L) 01/09/2019   MCV 85.8 01/09/2019   PLT 337 01/09/2019   NEUTROABS PENDING 01/09/2019    CMP  Lab Results  Component Value Date   NA 137 01/02/2019   K 4.4 01/02/2019   CL 103 01/02/2019   CO2 26 01/02/2019   GLUCOSE 99 01/02/2019   BUN 12 01/02/2019   CREATININE 0.63 01/02/2019   CALCIUM 9.4 01/02/2019   PROT 7.0 01/02/2019   ALBUMIN 4.3 01/02/2019   AST 29 01/02/2019   ALT 44 01/02/2019   ALKPHOS 180 (H) 01/02/2019   BILITOT 0.4 01/02/2019   GFRNONAA >60 01/02/2019   GFRAA >60 01/02/2019    Lab Results  Component Value Date   CEA1 1.02 01/02/2019    Medications: I have reviewed the patient's current medications.   Assessment/Plan: 1. Intrahepatic cholangiocarcinoma  Abdominal ultrasound 10/08/2018-heterogenous right hepatic mass  MRI abdomen 10/14/2018-2 enhancing right liver lesions, 4.8 x 4.5 x 4.1 cm, more superior lesion measured 3 x 2 x 2.5 cm, multiple additional subcentimeter T2 hyperintense lesions-too small to characterize.  CT chest 11/04/2018-6 mm left upper lobe groundglass nodule, no evidence of metastatic disease in the chest  Biopsy of right  liver lesion at [facility] 11/10/2018-moderately differentiated adenocarcinoma consistent with cholangiocarcinoma  CTs at [facility] 12/12/2018-right hepatic lobe masses, satellite lesions adjacent to the dominant mass, prominent periportal and aortocaval lymph nodes prominent left supraclavicular node  Cycle 1 gemcitabine/cisplatin at [place] on 12/12/2018  Cycle 2 gemcitabine/cisplatin 01/02/2019 2. Family history of multiple cancers eluding breast, pancreatic, melanoma, appendiceal cancer, lung cancer 3. Osteoporosis   Disposition: Angela Fleming returns as scheduled.  She will complete day 8 of cycle 2 chemotherapy today.  She is tolerating the chemotherapy well.  She will obtain a report of the NGS results from [facility]. Angela Fleming will return for an office visit and cycle 3 chemotherapy in 2 weeks.  [name], MD  01/09/2019  8:34 AM

## 2019-01-09 NOTE — Patient Instructions (Signed)
[facility] Discharge Instructions for Patients Receiving Chemotherapy  Today you received the following chemotherapy agents Gemzar, Cisplatin  To help prevent nausea and vomiting after your treatment, we encourage you to take your nausea medication as directed   If you develop nausea and vomiting that is not controlled by your nausea medication, call the clinic.   BELOW ARE SYMPTOMS THAT SHOULD BE REPORTED IMMEDIATELY:  *FEVER GREATER THAN 100.5 F  *CHILLS WITH OR WITHOUT FEVER  NAUSEA AND VOMITING THAT IS NOT CONTROLLED WITH YOUR NAUSEA MEDICATION  *UNUSUAL SHORTNESS OF BREATH  *UNUSUAL BRUISING OR BLEEDING  TENDERNESS IN MOUTH AND THROAT WITH OR WITHOUT PRESENCE OF ULCERS  *URINARY PROBLEMS  *BOWEL PROBLEMS  UNUSUAL RASH Items with * indicate a potential emergency and should be followed up as soon as possible.  Feel free to call the clinic should you have any questions or concerns. The clinic phone number is [phone].  Please show the CHEMO ALERT CARD at check-in to the Emergency Department and triage nurse.   

## 2019-01-10 ENCOUNTER — Ambulatory Visit: Payer: 59

## 2019-01-15 ENCOUNTER — Encounter: Payer: Self-pay | Admitting: Oncology

## 2019-01-15 NOTE — Progress Notes (Signed)
Patient called back regarding one-time $700 [place]. Based on verbal income, she is over the income. She verbalized understanding.  She has my card for any additional financial questions or concerns.

## 2019-01-18 ENCOUNTER — Other Ambulatory Visit: Payer: Self-pay | Admitting: Oncology

## 2019-01-23 ENCOUNTER — Encounter: Payer: Self-pay | Admitting: Nurse Practitioner

## 2019-01-23 ENCOUNTER — Inpatient Hospital Stay: Payer: No Typology Code available for payment source | Attending: Oncology

## 2019-01-23 ENCOUNTER — Inpatient Hospital Stay (HOSPITAL_BASED_OUTPATIENT_CLINIC_OR_DEPARTMENT_OTHER): Payer: No Typology Code available for payment source | Admitting: Nurse Practitioner

## 2019-01-23 ENCOUNTER — Inpatient Hospital Stay: Payer: No Typology Code available for payment source

## 2019-01-23 ENCOUNTER — Other Ambulatory Visit: Payer: Self-pay

## 2019-01-23 VITALS — BP 132/65 | HR 84 | Temp 98.3°F | Resp 16 | Ht 64.0 in | Wt 111.2 lb

## 2019-01-23 DIAGNOSIS — M818 Other osteoporosis without current pathological fracture: Secondary | ICD-10-CM | POA: Insufficient documentation

## 2019-01-23 DIAGNOSIS — C221 Intrahepatic bile duct carcinoma: Secondary | ICD-10-CM | POA: Diagnosis not present

## 2019-01-23 DIAGNOSIS — Z800 Family history of malignant neoplasm of digestive organs: Secondary | ICD-10-CM | POA: Insufficient documentation

## 2019-01-23 DIAGNOSIS — Z808 Family history of malignant neoplasm of other organs or systems: Secondary | ICD-10-CM | POA: Insufficient documentation

## 2019-01-23 DIAGNOSIS — Z803 Family history of malignant neoplasm of breast: Secondary | ICD-10-CM | POA: Insufficient documentation

## 2019-01-23 DIAGNOSIS — Z801 Family history of malignant neoplasm of trachea, bronchus and lung: Secondary | ICD-10-CM | POA: Insufficient documentation

## 2019-01-23 DIAGNOSIS — Z5111 Encounter for antineoplastic chemotherapy: Secondary | ICD-10-CM | POA: Diagnosis not present

## 2019-01-23 LAB — CBC WITH DIFFERENTIAL (CANCER CENTER ONLY)
Abs Immature Granulocytes: 0.06 10*3/uL (ref 0.00–0.07)
Basophils Absolute: 0 10*3/uL (ref 0.0–0.1)
Basophils Relative: 1 %
Eosinophils Absolute: 0 10*3/uL (ref 0.0–0.5)
Eosinophils Relative: 0 %
HCT: 37.3 % (ref 36.0–46.0)
Hemoglobin: 12 g/dL (ref 12.0–15.0)
Immature Granulocytes: 1 %
Lymphocytes Relative: 23 %
Lymphs Abs: 1.4 10*3/uL (ref 0.7–4.0)
MCH: 28.6 pg (ref 26.0–34.0)
MCHC: 32.2 g/dL (ref 30.0–36.0)
MCV: 88.8 fL (ref 80.0–100.0)
Monocytes Absolute: 0.6 10*3/uL (ref 0.1–1.0)
Monocytes Relative: 10 %
Neutro Abs: 3.9 10*3/uL (ref 1.7–7.7)
Neutrophils Relative %: 65 %
Platelet Count: 320 10*3/uL (ref 150–400)
RBC: 4.2 MIL/uL (ref 3.87–5.11)
RDW: 15.1 % (ref 11.5–15.5)
WBC Count: 6 10*3/uL (ref 4.0–10.5)
nRBC: 0 % (ref 0.0–0.2)

## 2019-01-23 LAB — CMP (CANCER CENTER ONLY)
ALT: 17 U/L (ref 0–44)
AST: 22 U/L (ref 15–41)
Albumin: 4.5 g/dL (ref 3.5–5.0)
Alkaline Phosphatase: 139 U/L — ABNORMAL HIGH (ref 38–126)
Anion gap: 9 (ref 5–15)
BUN: 10 mg/dL (ref 8–23)
CO2: 26 mmol/L (ref 22–32)
Calcium: 9.4 mg/dL (ref 8.9–10.3)
Chloride: 105 mmol/L (ref 98–111)
Creatinine: 0.68 mg/dL (ref 0.44–1.00)
GFR, Est AFR Am: >60 mL/min (ref >60)
GFR, Estimated: >60 mL/min (ref >60)
Glucose, Bld: 109 mg/dL — ABNORMAL HIGH (ref 70–99)
Potassium: 4.4 mmol/L (ref 3.5–5.1)
Sodium: 140 mmol/L (ref 135–145)
Total Bilirubin: 0.4 mg/dL (ref 0.3–1.2)
Total Protein: 7 g/dL (ref 6.5–8.1)

## 2019-01-23 LAB — MAGNESIUM: Magnesium: 1.9 mg/dL (ref 1.7–2.4)

## 2019-01-23 MED ORDER — SODIUM CHLORIDE 0.9 % IV SOLN
Freq: Once | INTRAVENOUS | Status: AC
  Administered 2019-01-23: 10:00:00 via INTRAVENOUS
  Filled 2019-01-23: qty 250

## 2019-01-23 MED ORDER — PALONOSETRON HCL INJECTION 0.25 MG/5ML
INTRAVENOUS | Status: AC
  Filled 2019-01-23: qty 5

## 2019-01-23 MED ORDER — PALONOSETRON HCL INJECTION 0.25 MG/5ML
0.2500 mg | Freq: Once | INTRAVENOUS | Status: AC
  Administered 2019-01-23: 0.25 mg via INTRAVENOUS

## 2019-01-23 MED ORDER — SODIUM CHLORIDE 0.9 % IV SOLN
Freq: Once | INTRAVENOUS | Status: AC
  Administered 2019-01-23: 12:00:00 via INTRAVENOUS
  Filled 2019-01-23: qty 5

## 2019-01-23 MED ORDER — POTASSIUM CHLORIDE 2 MEQ/ML IV SOLN
Freq: Once | INTRAVENOUS | Status: AC
  Administered 2019-01-23: 10:00:00 via INTRAVENOUS
  Filled 2019-01-23: qty 10

## 2019-01-23 MED ORDER — HEPARIN SOD (PORK) LOCK FLUSH 100 UNIT/ML IV SOLN
500.0000 [IU] | Freq: Once | INTRAVENOUS | Status: AC | PRN
  Administered 2019-01-23: 500 [IU]
  Filled 2019-01-23: qty 5

## 2019-01-23 MED ORDER — SODIUM CHLORIDE 0.9 % IV SOLN
1000.0000 mg/m2 | Freq: Once | INTRAVENOUS | Status: AC
  Administered 2019-01-23: 1482 mg via INTRAVENOUS
  Filled 2019-01-23: qty 38.98

## 2019-01-23 MED ORDER — SODIUM CHLORIDE 0.9 % IV SOLN
25.0000 mg/m2 | Freq: Once | INTRAVENOUS | Status: AC
  Administered 2019-01-23: 38 mg via INTRAVENOUS
  Filled 2019-01-23: qty 38

## 2019-01-23 MED ORDER — SODIUM CHLORIDE 0.9% FLUSH
10.0000 mL | INTRAVENOUS | Status: DC | PRN
  Administered 2019-01-23: 10 mL
  Filled 2019-01-23: qty 10

## 2019-01-23 NOTE — Progress Notes (Signed)
  [place] OFFICE PROGRESS NOTE   Diagnosis: Cholangiocarcinoma  INTERVAL HISTORY:   Angela Fleming returns as scheduled.  She completed cycle 2-day 8 gemcitabine/cisplatin 01/09/2019.  She denies nausea/vomiting.  No mouth sores.  No diarrhea.  No numbness or tingling in the hands or feet.  No fever or cough.  No skin rash.  She denies shortness of breath.  No tinnitus or hearing loss.  She notes hair thinning.  Objective:  Vital signs in last 24 hours:  Blood pressure 132/65, pulse 84, temperature 98.3 F (36.8 C), temperature source Temporal, resp. rate 16, height 5' 4" (1.626 m), weight 111 lb 3.2 oz (50.4 kg), SpO2 100 %.    HEENT: No thrush or ulcers. GI: Abdomen soft and nontender.  No hepatomegaly. Vascular: No leg edema.  Calves soft and nontender. Neuro: Vibratory sense mildly decreased over the fingertips per tuning fork exam. Skin: No rash. Port-A-Cath without erythema.   Lab Results:  Lab Results  Component Value Date   WBC 6.0 01/23/2019   HGB 12.0 01/23/2019   HCT 37.3 01/23/2019   MCV 88.8 01/23/2019   PLT 320 01/23/2019   NEUTROABS 3.9 01/23/2019    Imaging:  No results found.  Medications: I have reviewed the patient's current medications.  Assessment/Plan: 1. Intrahepatic cholangiocarcinoma ? Abdominal ultrasound 10/08/2018-heterogenous right hepatic mass ? MRI abdomen 10/14/2018-2 enhancing right liver lesions, 4.8 x 4.5 x 4.1 cm, more superior lesion measured 3 x 2 x 2.5 cm, multiple additional subcentimeter T2 hyperintense lesions-too small to characterize. ? CT chest 11/04/2018-6 mm left upper lobe groundglass nodule, no evidence of metastatic disease in the chest ? Biopsy of right liver lesion at [facility] 11/10/2018-moderately differentiated adenocarcinoma consistent with cholangiocarcinoma; IDH1 alteration; MSI stable; mismatch repair status proficient; tumor mutational burden low, 3; PD-L1 negative ? CTs at [facility] 12/12/2018-right  hepatic lobe masses, satellite lesions adjacent to the dominant mass, prominent periportal and aortocaval lymph nodes prominent left supraclavicular node ? Cycle 1 gemcitabine/cisplatin at [facility] on 12/12/2018 ? Cycle 2 gemcitabine/cisplatin 01/02/2019, 01/09/2019 ? Cycle 3 gemcitabine/cisplatin 01/23/2019 2. Family history of multiple cancers eluding breast, pancreatic, melanoma, appendiceal cancer, lung cancer 3. Osteoporosis  Disposition: Angela Fleming appears stable.  She has completed 2 cycles of gemcitabine/cisplatin.  She is tolerating treatment well.  Plan to proceed with cycle 3-day 1 today as scheduled.  She will return for cycle 3-day 8 in 1 week.  She is scheduled to follow-up with Dr. Fanny Skates at [facility] on 02/02/2019.  We reviewed the CBC from today.  Counts adequate to proceed with treatment.  She will return for lab, follow-up, cycle 4 gemcitabine/cisplatin on 02/11/2019.  She will contact the office in the interim with any problems.  Plan reviewed with Dr. Benay Spice.    Ned Card ANP/GNP-BC   01/23/2019  9:24 AM

## 2019-01-23 NOTE — Patient Instructions (Signed)
[place] Discharge Instructions for Patients Receiving Chemotherapy  Today you received the following chemotherapy agents:  Cisplatin, Gemzar  To help prevent nausea and vomiting after your treatment, we encourage you to take your nausea medication as prescribed.   If you develop nausea and vomiting that is not controlled by your nausea medication, call the clinic.   BELOW ARE SYMPTOMS THAT SHOULD BE REPORTED IMMEDIATELY:  *FEVER GREATER THAN 100.5 F  *CHILLS WITH OR WITHOUT FEVER  NAUSEA AND VOMITING THAT IS NOT CONTROLLED WITH YOUR NAUSEA MEDICATION  *UNUSUAL SHORTNESS OF BREATH  *UNUSUAL BRUISING OR BLEEDING  TENDERNESS IN MOUTH AND THROAT WITH OR WITHOUT PRESENCE OF ULCERS  *URINARY PROBLEMS  *BOWEL PROBLEMS  UNUSUAL RASH Items with * indicate a potential emergency and should be followed up as soon as possible.  Feel free to call the clinic should you have any questions or concerns. The clinic phone number is [phone].  Please show the [place] at check-in to the Emergency Department and triage nurse.

## 2019-01-25 ENCOUNTER — Other Ambulatory Visit: Payer: Self-pay | Admitting: Oncology

## 2019-01-26 ENCOUNTER — Telehealth: Payer: Self-pay | Admitting: Oncology

## 2019-01-26 NOTE — Telephone Encounter (Signed)
Scheduled per 12/11 los. Added MD appt to 12/30

## 2019-01-30 ENCOUNTER — Other Ambulatory Visit: Payer: Self-pay

## 2019-01-30 ENCOUNTER — Inpatient Hospital Stay: Payer: No Typology Code available for payment source

## 2019-01-30 VITALS — BP 119/75 | HR 85 | Temp 97.8°F | Resp 18 | Wt 110.0 lb

## 2019-01-30 DIAGNOSIS — C221 Intrahepatic bile duct carcinoma: Secondary | ICD-10-CM

## 2019-01-30 DIAGNOSIS — Z5111 Encounter for antineoplastic chemotherapy: Secondary | ICD-10-CM | POA: Diagnosis not present

## 2019-01-30 DIAGNOSIS — Z95828 Presence of other vascular implants and grafts: Secondary | ICD-10-CM

## 2019-01-30 LAB — CMP (CANCER CENTER ONLY)
ALT: 20 U/L (ref 0–44)
AST: 22 U/L (ref 15–41)
Albumin: 4.1 g/dL (ref 3.5–5.0)
Alkaline Phosphatase: 125 U/L (ref 38–126)
Anion gap: 11 (ref 5–15)
BUN: 14 mg/dL (ref 8–23)
CO2: 23 mmol/L (ref 22–32)
Calcium: 9.4 mg/dL (ref 8.9–10.3)
Chloride: 103 mmol/L (ref 98–111)
Creatinine: 0.64 mg/dL (ref 0.44–1.00)
GFR, Est AFR Am: >60 mL/min (ref >60)
GFR, Estimated: >60 mL/min (ref >60)
Glucose, Bld: 103 mg/dL — ABNORMAL HIGH (ref 70–99)
Potassium: 4.2 mmol/L (ref 3.5–5.1)
Sodium: 137 mmol/L (ref 135–145)
Total Bilirubin: 0.3 mg/dL (ref 0.3–1.2)
Total Protein: 6.8 g/dL (ref 6.5–8.1)

## 2019-01-30 LAB — CBC WITH DIFFERENTIAL (CANCER CENTER ONLY)
Abs Immature Granulocytes: 0.22 10*3/uL — ABNORMAL HIGH (ref 0.00–0.07)
Basophils Absolute: 0.1 10*3/uL (ref 0.0–0.1)
Basophils Relative: 1 %
Eosinophils Absolute: 0 10*3/uL (ref 0.0–0.5)
Eosinophils Relative: 1 %
HCT: 35.4 % — ABNORMAL LOW (ref 36.0–46.0)
Hemoglobin: 11.7 g/dL — ABNORMAL LOW (ref 12.0–15.0)
Immature Granulocytes: 6 %
Lymphocytes Relative: 41 %
Lymphs Abs: 1.6 10*3/uL (ref 0.7–4.0)
MCH: 28.6 pg (ref 26.0–34.0)
MCHC: 33.1 g/dL (ref 30.0–36.0)
MCV: 86.6 fL (ref 80.0–100.0)
Monocytes Absolute: 0.3 10*3/uL (ref 0.1–1.0)
Monocytes Relative: 7 %
Neutro Abs: 1.7 10*3/uL (ref 1.7–7.7)
Neutrophils Relative %: 44 %
Platelet Count: 285 10*3/uL (ref 150–400)
RBC: 4.09 MIL/uL (ref 3.87–5.11)
RDW: 14.7 % (ref 11.5–15.5)
WBC Count: 3.9 10*3/uL — ABNORMAL LOW (ref 4.0–10.5)
nRBC: 0 % (ref 0.0–0.2)

## 2019-01-30 LAB — MAGNESIUM: Magnesium: 1.8 mg/dL (ref 1.7–2.4)

## 2019-01-30 MED ORDER — PALONOSETRON HCL INJECTION 0.25 MG/5ML
0.2500 mg | Freq: Once | INTRAVENOUS | Status: AC
  Administered 2019-01-30: 12:00:00 0.25 mg via INTRAVENOUS

## 2019-01-30 MED ORDER — SODIUM CHLORIDE 0.9% FLUSH
10.0000 mL | INTRAVENOUS | Status: DC | PRN
  Administered 2019-01-30: 17:00:00 10 mL
  Filled 2019-01-30: qty 10

## 2019-01-30 MED ORDER — SODIUM CHLORIDE 0.9 % IV SOLN
1000.0000 mg/m2 | Freq: Once | INTRAVENOUS | Status: AC
  Administered 2019-01-30: 13:00:00 1482 mg via INTRAVENOUS
  Filled 2019-01-30: qty 38.98

## 2019-01-30 MED ORDER — SODIUM CHLORIDE 0.9 % IV SOLN
Freq: Once | INTRAVENOUS | Status: AC
  Filled 2019-01-30: qty 250

## 2019-01-30 MED ORDER — SODIUM CHLORIDE 0.9 % IV SOLN
Freq: Once | INTRAVENOUS | Status: AC
  Filled 2019-01-30: qty 5

## 2019-01-30 MED ORDER — SODIUM CHLORIDE 0.9% FLUSH
10.0000 mL | INTRAVENOUS | Status: DC | PRN
  Administered 2019-01-30: 09:00:00 10 mL
  Filled 2019-01-30: qty 10

## 2019-01-30 MED ORDER — SODIUM CHLORIDE 0.9 % IV SOLN
25.0000 mg/m2 | Freq: Once | INTRAVENOUS | Status: AC
  Administered 2019-01-30: 38 mg via INTRAVENOUS
  Filled 2019-01-30: qty 38

## 2019-01-30 MED ORDER — POTASSIUM CHLORIDE 2 MEQ/ML IV SOLN
Freq: Once | INTRAVENOUS | Status: AC
  Filled 2019-01-30: qty 10

## 2019-01-30 MED ORDER — HEPARIN SOD (PORK) LOCK FLUSH 100 UNIT/ML IV SOLN
500.0000 [IU] | Freq: Once | INTRAVENOUS | Status: AC | PRN
  Administered 2019-01-30: 17:00:00 500 [IU]
  Filled 2019-01-30: qty 5

## 2019-01-30 MED ORDER — PALONOSETRON HCL INJECTION 0.25 MG/5ML
INTRAVENOUS | Status: AC
  Filled 2019-01-30: qty 5

## 2019-01-30 NOTE — Patient Instructions (Signed)
[place] Discharge Instructions for Patients Receiving Chemotherapy  Today you received the following chemotherapy agents:  Cisplatin, Gemzar  To help prevent nausea and vomiting after your treatment, we encourage you to take your nausea medication as prescribed.   If you develop nausea and vomiting that is not controlled by your nausea medication, call the clinic.   BELOW ARE SYMPTOMS THAT SHOULD BE REPORTED IMMEDIATELY:  *FEVER GREATER THAN 100.5 F  *CHILLS WITH OR WITHOUT FEVER  NAUSEA AND VOMITING THAT IS NOT CONTROLLED WITH YOUR NAUSEA MEDICATION  *UNUSUAL SHORTNESS OF BREATH  *UNUSUAL BRUISING OR BLEEDING  TENDERNESS IN MOUTH AND THROAT WITH OR WITHOUT PRESENCE OF ULCERS  *URINARY PROBLEMS  *BOWEL PROBLEMS  UNUSUAL RASH Items with * indicate a potential emergency and should be followed up as soon as possible.  Feel free to call the clinic should you have any questions or concerns. The clinic phone number is [phone].  Please show the [place] at check-in to the Emergency Department and triage nurse.

## 2019-01-30 NOTE — Progress Notes (Signed)
Patient reported having some vision changes after her last treatment. Per Patient, she had a couple of instances that only lasted a minute where she had silver flashes of light in her periphery in both eyes. Patient states it would bo aware after a couple of minutes and would only occur on the days she was really tired. Patient also stated she had a couple of instances where she would lay down and she felt as if the room were spinning around her.  This would go aware when she closed her eyes for a few minutes. She said that these vision changes have happened after previous treatments, but it hadn't happened after the last couple of treatments. Patient denies headaches, nausea, long term vision changes.  Ned Card, NP made aware and evaluated patient in infusion room. Per Ned Card, NP, ok to treat today.

## 2019-02-04 ENCOUNTER — Encounter: Payer: Self-pay | Admitting: Nurse Practitioner

## 2019-02-08 ENCOUNTER — Other Ambulatory Visit: Payer: Self-pay | Admitting: Oncology

## 2019-02-11 ENCOUNTER — Other Ambulatory Visit: Payer: Self-pay

## 2019-02-11 ENCOUNTER — Other Ambulatory Visit: Payer: 59

## 2019-02-11 ENCOUNTER — Inpatient Hospital Stay: Payer: No Typology Code available for payment source

## 2019-02-11 ENCOUNTER — Inpatient Hospital Stay (HOSPITAL_BASED_OUTPATIENT_CLINIC_OR_DEPARTMENT_OTHER): Payer: No Typology Code available for payment source | Admitting: Oncology

## 2019-02-11 ENCOUNTER — Telehealth: Payer: Self-pay | Admitting: Oncology

## 2019-02-11 VITALS — BP 118/74 | HR 80 | Temp 98.2°F | Resp 18 | Ht 64.0 in | Wt 114.9 lb

## 2019-02-11 DIAGNOSIS — C221 Intrahepatic bile duct carcinoma: Secondary | ICD-10-CM

## 2019-02-11 DIAGNOSIS — Z5111 Encounter for antineoplastic chemotherapy: Secondary | ICD-10-CM | POA: Diagnosis not present

## 2019-02-11 DIAGNOSIS — Z95828 Presence of other vascular implants and grafts: Secondary | ICD-10-CM

## 2019-02-11 LAB — CBC WITH DIFFERENTIAL (CANCER CENTER ONLY)
Abs Immature Granulocytes: 0.03 10*3/uL (ref 0.00–0.07)
Basophils Absolute: 0 10*3/uL (ref 0.0–0.1)
Basophils Relative: 0 %
Eosinophils Absolute: 0 10*3/uL (ref 0.0–0.5)
Eosinophils Relative: 1 %
HCT: 34.5 % — ABNORMAL LOW (ref 36.0–46.0)
Hemoglobin: 11.3 g/dL — ABNORMAL LOW (ref 12.0–15.0)
Immature Granulocytes: 1 %
Lymphocytes Relative: 27 %
Lymphs Abs: 1.1 10*3/uL (ref 0.7–4.0)
MCH: 29.5 pg (ref 26.0–34.0)
MCHC: 32.8 g/dL (ref 30.0–36.0)
MCV: 90.1 fL (ref 80.0–100.0)
Monocytes Absolute: 0.4 10*3/uL (ref 0.1–1.0)
Monocytes Relative: 10 %
Neutro Abs: 2.6 10*3/uL (ref 1.7–7.7)
Neutrophils Relative %: 61 %
Platelet Count: 221 10*3/uL (ref 150–400)
RBC: 3.83 MIL/uL — ABNORMAL LOW (ref 3.87–5.11)
RDW: 16.6 % — ABNORMAL HIGH (ref 11.5–15.5)
WBC Count: 4.1 10*3/uL (ref 4.0–10.5)
nRBC: 0 % (ref 0.0–0.2)

## 2019-02-11 LAB — CMP (CANCER CENTER ONLY)
ALT: 15 U/L (ref 0–44)
AST: 17 U/L (ref 15–41)
Albumin: 4.2 g/dL (ref 3.5–5.0)
Alkaline Phosphatase: 131 U/L — ABNORMAL HIGH (ref 38–126)
Anion gap: 9 (ref 5–15)
BUN: 12 mg/dL (ref 8–23)
CO2: 24 mmol/L (ref 22–32)
Calcium: 9.1 mg/dL (ref 8.9–10.3)
Chloride: 106 mmol/L (ref 98–111)
Creatinine: 0.63 mg/dL (ref 0.44–1.00)
GFR, Est AFR Am: >60 mL/min (ref >60)
GFR, Estimated: >60 mL/min (ref >60)
Glucose, Bld: 96 mg/dL (ref 70–99)
Potassium: 4.3 mmol/L (ref 3.5–5.1)
Sodium: 139 mmol/L (ref 135–145)
Total Bilirubin: 0.3 mg/dL (ref 0.3–1.2)
Total Protein: 6.8 g/dL (ref 6.5–8.1)

## 2019-02-11 LAB — MAGNESIUM: Magnesium: 1.8 mg/dL (ref 1.7–2.4)

## 2019-02-11 MED ORDER — SODIUM CHLORIDE 0.9% FLUSH
10.0000 mL | INTRAVENOUS | Status: DC | PRN
  Administered 2019-02-11: 17:00:00 10 mL
  Filled 2019-02-11: qty 10

## 2019-02-11 MED ORDER — SODIUM CHLORIDE 0.9% FLUSH
10.0000 mL | INTRAVENOUS | Status: DC | PRN
  Administered 2019-02-11: 09:00:00 10 mL
  Filled 2019-02-11: qty 10

## 2019-02-11 MED ORDER — PALONOSETRON HCL INJECTION 0.25 MG/5ML
0.2500 mg | Freq: Once | INTRAVENOUS | Status: AC
  Administered 2019-02-11: 0.25 mg via INTRAVENOUS

## 2019-02-11 MED ORDER — SODIUM CHLORIDE 0.9 % IV SOLN
Freq: Once | INTRAVENOUS | Status: AC
  Filled 2019-02-11: qty 250

## 2019-02-11 MED ORDER — SODIUM CHLORIDE 0.9 % IV SOLN
25.0000 mg/m2 | Freq: Once | INTRAVENOUS | Status: AC
  Administered 2019-02-11: 14:00:00 38 mg via INTRAVENOUS
  Filled 2019-02-11: qty 38

## 2019-02-11 MED ORDER — HEPARIN SOD (PORK) LOCK FLUSH 100 UNIT/ML IV SOLN
500.0000 [IU] | Freq: Once | INTRAVENOUS | Status: AC | PRN
  Administered 2019-02-11: 17:00:00 500 [IU]
  Filled 2019-02-11: qty 5

## 2019-02-11 MED ORDER — SODIUM CHLORIDE 0.9 % IV SOLN
Freq: Once | INTRAVENOUS | Status: AC
  Filled 2019-02-11: qty 5

## 2019-02-11 MED ORDER — POTASSIUM CHLORIDE 2 MEQ/ML IV SOLN
Freq: Once | INTRAVENOUS | Status: AC
  Filled 2019-02-11: qty 10

## 2019-02-11 MED ORDER — SODIUM CHLORIDE 0.9 % IV SOLN
1000.0000 mg/m2 | Freq: Once | INTRAVENOUS | Status: AC
  Administered 2019-02-11: 13:00:00 1482 mg via INTRAVENOUS
  Filled 2019-02-11: qty 38.98

## 2019-02-11 MED ORDER — PALONOSETRON HCL INJECTION 0.25 MG/5ML
INTRAVENOUS | Status: AC
  Filled 2019-02-11: qty 5

## 2019-02-11 MED ORDER — SODIUM CHLORIDE 0.9 % IV SOLN
Freq: Once | INTRAVENOUS | Status: DC
  Filled 2019-02-11: qty 250

## 2019-02-11 NOTE — Patient Instructions (Signed)
[place] Discharge Instructions for Patients Receiving Chemotherapy  Today you received the following chemotherapy agents:  Cisplatin, Gemzar  To help prevent nausea and vomiting after your treatment, we encourage you to take your nausea medication as prescribed.   If you develop nausea and vomiting that is not controlled by your nausea medication, call the clinic.   BELOW ARE SYMPTOMS THAT SHOULD BE REPORTED IMMEDIATELY:  *FEVER GREATER THAN 100.5 F  *CHILLS WITH OR WITHOUT FEVER  NAUSEA AND VOMITING THAT IS NOT CONTROLLED WITH YOUR NAUSEA MEDICATION  *UNUSUAL SHORTNESS OF BREATH  *UNUSUAL BRUISING OR BLEEDING  TENDERNESS IN MOUTH AND THROAT WITH OR WITHOUT PRESENCE OF ULCERS  *URINARY PROBLEMS  *BOWEL PROBLEMS  UNUSUAL RASH Items with * indicate a potential emergency and should be followed up as soon as possible.  Feel free to call the clinic should you have any questions or concerns. The clinic phone number is [phone].  Please show the [place] at check-in to the Emergency Department and triage nurse.

## 2019-02-11 NOTE — Progress Notes (Signed)
[facility] OFFICE PROGRESS NOTE   Diagnosis: Cholangiocarcinoma  INTERVAL HISTORY:   Angela Fleming completed cycle 3 gemcitabine/cisplatin on 01/30/2019.  She underwent restaging CT[facility] at [facility] on 02/02/2019.  Multiple hepatic masses have decreased in size.  Decreased size of prominent periportal and aortocaval nodes.  Unchanged prominent left supraclavicular node.  She feels well.  She had brief episodes of "flashing" in her visual fields after several cycles of chemotherapy.  These episodes lasted seconds or a minute.  No other neurologic symptoms.  No change in her vision at present.  She has decreased abdominal fullness.  Good appetite.  No peripheral neuropathy symptoms.  No fever or rash.  Objective:  Vital signs in last 24 hours:  Blood pressure 118/74, pulse 80, temperature 98.2 F (36.8 C), temperature source Temporal, resp. rate 18, height '5\' 4"'$  (1.626 m), weight 114 lb 14.4 oz (52.1 kg), SpO2 100 %.   Limited physical examination secondary to distancing with the Covid pandemic HEENT: No thrush or ulcers GI: No hepatomegaly, no mass, nontender Vascular: No leg edema Neuro: The extraocular movements are intact.  Alert and oriented. Skin: Palms without erythema.  Portacath/PICC-without erythema  Lab Results:  Lab Results  Component Value Date   WBC 4.1 02/11/2019   HGB 11.3 (L) 02/11/2019   HCT 34.5 (L) 02/11/2019   MCV 90.1 02/11/2019   PLT 221 02/11/2019   NEUTROABS 2.6 02/11/2019    CMP  Lab Results  Component Value Date   NA 137 01/30/2019   K 4.2 01/30/2019   CL 103 01/30/2019   CO2 23 01/30/2019   GLUCOSE 103 (H) 01/30/2019   BUN 14 01/30/2019   CREATININE 0.64 01/30/2019   CALCIUM 9.4 01/30/2019   PROT 6.8 01/30/2019   ALBUMIN 4.1 01/30/2019   AST 22 01/30/2019   ALT 20 01/30/2019   ALKPHOS 125 01/30/2019   BILITOT 0.3 01/30/2019   GFRNONAA >60 01/30/2019   GFRAA >60 01/30/2019    Lab Results  Component Value Date   CEA1 1.02  01/02/2019     Medications: I have reviewed the patient's current medications.   Assessment/Plan: 1. Intrahepatic cholangiocarcinoma ? Abdominal ultrasound 10/08/2018-heterogenous right hepatic mass ? MRI abdomen 10/14/2018-2 enhancing right liver lesions, 4.8 x 4.5 x 4.1 cm, more superior lesion measured 3 x 2 x 2.5 cm, multiple additional subcentimeter T2 hyperintense lesions-too small to characterize. ? CT chest 11/04/2018-6 mm left upper lobe groundglass nodule, no evidence of metastatic disease in the chest ? Biopsy of right liver lesion at [facility] 11/10/2018-moderately differentiated adenocarcinoma consistent with cholangiocarcinoma; IDH1 alteration; MSI stable; mismatch repair status proficient; tumor mutational burden low, 3; PD-L1 negative ? CTs at [facility] 12/12/2018-right hepatic lobe masses, satellite lesions adjacent to the dominant mass, prominent periportal and aortocaval lymph nodes prominent left supraclavicular node ? Cycle 1 gemcitabine/cisplatin at [facility] on 12/12/2018 ? Cycle 2 gemcitabine/cisplatin 01/02/2019, 01/09/2019 ? Cycle 3 gemcitabine/cisplatin 01/23/2019 ? CTs at [facility] 02/02/2019-decrease in size of multiple hepatic masses, decreased size of prominent periportal and aortocaval nodes, unchanged prominent left supraclavicular node ? Cycle 4 gemcitabine/cisplatin 02/11/2019 2. Family history of multiple cancers eluding breast, pancreatic, melanoma, appendiceal cancer, lung cancer 3. Osteoporosis    Disposition: Angela Fleming has completed 3 cycles of gemcitabine/cisplatin.  She has tolerated chemotherapy well.  CTs on 02/02/2019 confirmed a response to therapy.  She will begin cycle 4 gemcitabine/cisplatin today.  She will be scheduled for an office visit prior to cycle 5.  The etiology of the visual symptoms  is unclear.  This would not be a typical side effect from gemcitabine or cisplatin.    [name], MD  02/11/2019  9:04 AM

## 2019-02-11 NOTE — Telephone Encounter (Signed)
Scheduled per los. Patient declined printout  

## 2019-02-13 ENCOUNTER — Other Ambulatory Visit: Payer: Self-pay | Admitting: Oncology

## 2019-02-18 ENCOUNTER — Other Ambulatory Visit: Payer: Self-pay | Admitting: *Deleted

## 2019-02-18 ENCOUNTER — Inpatient Hospital Stay: Payer: No Typology Code available for payment source

## 2019-02-18 ENCOUNTER — Inpatient Hospital Stay: Payer: No Typology Code available for payment source | Attending: Oncology

## 2019-02-18 ENCOUNTER — Other Ambulatory Visit: Payer: Self-pay

## 2019-02-18 ENCOUNTER — Inpatient Hospital Stay (HOSPITAL_BASED_OUTPATIENT_CLINIC_OR_DEPARTMENT_OTHER): Payer: No Typology Code available for payment source | Admitting: Oncology

## 2019-02-18 ENCOUNTER — Telehealth: Payer: Self-pay | Admitting: Oncology

## 2019-02-18 VITALS — BP 118/73 | HR 82 | Temp 98.3°F | Resp 18 | Ht 64.0 in | Wt 114.6 lb

## 2019-02-18 DIAGNOSIS — C221 Intrahepatic bile duct carcinoma: Secondary | ICD-10-CM | POA: Diagnosis not present

## 2019-02-18 DIAGNOSIS — Z808 Family history of malignant neoplasm of other organs or systems: Secondary | ICD-10-CM | POA: Insufficient documentation

## 2019-02-18 DIAGNOSIS — Z803 Family history of malignant neoplasm of breast: Secondary | ICD-10-CM | POA: Insufficient documentation

## 2019-02-18 DIAGNOSIS — Z800 Family history of malignant neoplasm of digestive organs: Secondary | ICD-10-CM | POA: Diagnosis not present

## 2019-02-18 DIAGNOSIS — K5900 Constipation, unspecified: Secondary | ICD-10-CM | POA: Diagnosis not present

## 2019-02-18 DIAGNOSIS — Z5111 Encounter for antineoplastic chemotherapy: Secondary | ICD-10-CM | POA: Diagnosis present

## 2019-02-18 DIAGNOSIS — M810 Age-related osteoporosis without current pathological fracture: Secondary | ICD-10-CM | POA: Diagnosis not present

## 2019-02-18 DIAGNOSIS — Z801 Family history of malignant neoplasm of trachea, bronchus and lung: Secondary | ICD-10-CM | POA: Diagnosis not present

## 2019-02-18 DIAGNOSIS — Z5189 Encounter for other specified aftercare: Secondary | ICD-10-CM | POA: Insufficient documentation

## 2019-02-18 DIAGNOSIS — D701 Agranulocytosis secondary to cancer chemotherapy: Secondary | ICD-10-CM | POA: Diagnosis not present

## 2019-02-18 DIAGNOSIS — T451X5A Adverse effect of antineoplastic and immunosuppressive drugs, initial encounter: Secondary | ICD-10-CM | POA: Diagnosis not present

## 2019-02-18 DIAGNOSIS — Z95828 Presence of other vascular implants and grafts: Secondary | ICD-10-CM

## 2019-02-18 LAB — MAGNESIUM: Magnesium: 1.8 mg/dL (ref 1.7–2.4)

## 2019-02-18 LAB — CBC WITH DIFFERENTIAL (CANCER CENTER ONLY)
Abs Immature Granulocytes: 0.16 10*3/uL — ABNORMAL HIGH (ref 0.00–0.07)
Basophils Absolute: 0 10*3/uL (ref 0.0–0.1)
Basophils Relative: 1 %
Eosinophils Absolute: 0 10*3/uL (ref 0.0–0.5)
Eosinophils Relative: 1 %
HCT: 31.7 % — ABNORMAL LOW (ref 36.0–46.0)
Hemoglobin: 10.5 g/dL — ABNORMAL LOW (ref 12.0–15.0)
Immature Granulocytes: 6 %
Lymphocytes Relative: 52 %
Lymphs Abs: 1.3 10*3/uL (ref 0.7–4.0)
MCH: 29.7 pg (ref 26.0–34.0)
MCHC: 33.1 g/dL (ref 30.0–36.0)
MCV: 89.5 fL (ref 80.0–100.0)
Monocytes Absolute: 0.2 10*3/uL (ref 0.1–1.0)
Monocytes Relative: 6 %
Neutro Abs: 0.9 10*3/uL — ABNORMAL LOW (ref 1.7–7.7)
Neutrophils Relative %: 34 %
Platelet Count: 313 10*3/uL (ref 150–400)
RBC: 3.54 MIL/uL — ABNORMAL LOW (ref 3.87–5.11)
RDW: 15.9 % — ABNORMAL HIGH (ref 11.5–15.5)
WBC Count: 2.5 10*3/uL — ABNORMAL LOW (ref 4.0–10.5)
nRBC: 0 % (ref 0.0–0.2)

## 2019-02-18 LAB — CMP (CANCER CENTER ONLY)
ALT: 20 U/L (ref 0–44)
AST: 19 U/L (ref 15–41)
Albumin: 4.2 g/dL (ref 3.5–5.0)
Alkaline Phosphatase: 120 U/L (ref 38–126)
Anion gap: 9 (ref 5–15)
BUN: 14 mg/dL (ref 8–23)
CO2: 23 mmol/L (ref 22–32)
Calcium: 8.8 mg/dL — ABNORMAL LOW (ref 8.9–10.3)
Chloride: 106 mmol/L (ref 98–111)
Creatinine: 0.65 mg/dL (ref 0.44–1.00)
GFR, Est AFR Am: >60 mL/min (ref >60)
GFR, Estimated: >60 mL/min (ref >60)
Glucose, Bld: 92 mg/dL (ref 70–99)
Potassium: 4.2 mmol/L (ref 3.5–5.1)
Sodium: 138 mmol/L (ref 135–145)
Total Bilirubin: 0.2 mg/dL — ABNORMAL LOW (ref 0.3–1.2)
Total Protein: 6.5 g/dL (ref 6.5–8.1)

## 2019-02-18 MED ORDER — SODIUM CHLORIDE 0.9 % IV SOLN
Freq: Once | INTRAVENOUS | Status: AC
  Filled 2019-02-18: qty 250

## 2019-02-18 MED ORDER — SODIUM CHLORIDE 0.9% FLUSH
10.0000 mL | INTRAVENOUS | Status: DC | PRN
  Administered 2019-02-18: 10 mL
  Filled 2019-02-18: qty 10

## 2019-02-18 MED ORDER — PALONOSETRON HCL INJECTION 0.25 MG/5ML
0.2500 mg | Freq: Once | INTRAVENOUS | Status: AC
  Administered 2019-02-18: 0.25 mg via INTRAVENOUS

## 2019-02-18 MED ORDER — SODIUM CHLORIDE 0.9 % IV SOLN
25.0000 mg/m2 | Freq: Once | INTRAVENOUS | Status: AC
  Administered 2019-02-18: 38 mg via INTRAVENOUS
  Filled 2019-02-18: qty 38

## 2019-02-18 MED ORDER — HEPARIN SOD (PORK) LOCK FLUSH 100 UNIT/ML IV SOLN
500.0000 [IU] | Freq: Once | INTRAVENOUS | Status: AC | PRN
  Administered 2019-02-18: 500 [IU]
  Filled 2019-02-18: qty 5

## 2019-02-18 MED ORDER — POTASSIUM CHLORIDE 2 MEQ/ML IV SOLN
Freq: Once | INTRAVENOUS | Status: AC
  Filled 2019-02-18: qty 10

## 2019-02-18 MED ORDER — SODIUM CHLORIDE 0.9 % IV SOLN
Freq: Once | INTRAVENOUS | Status: AC
  Filled 2019-02-18: qty 5

## 2019-02-18 MED ORDER — SODIUM CHLORIDE 0.9 % IV SOLN
800.0000 mg/m2 | Freq: Once | INTRAVENOUS | Status: AC
  Administered 2019-02-18: 1216 mg via INTRAVENOUS
  Filled 2019-02-18: qty 31.98

## 2019-02-18 MED ORDER — PALONOSETRON HCL INJECTION 0.25 MG/5ML
INTRAVENOUS | Status: AC
  Filled 2019-02-18: qty 5

## 2019-02-18 NOTE — Patient Instructions (Signed)

## 2019-02-18 NOTE — Progress Notes (Signed)
[place] OFFICE PROGRESS NOTE   Diagnosis: Cholangiocarcinoma  INTERVAL HISTORY:   Angela Fleming completed day 1 of cycle 3 gemcitabine/cisplatin on 02/11/2019.  She had an approximate 1 minute episode of bright silver areas in her visual fields on approximately day 5.  This resolved when she closed her eyes.  No other neurologic or visual symptoms.  No nausea, rash, fever, or neuropathy symptoms.  No abdominal pain.  Objective:  Vital signs in last 24 hours:  Blood pressure 118/73, pulse 82, temperature 98.3 F (36.8 C), temperature source Temporal, resp. rate 18, height _0  (1.626 m), weight 114 lb 9.6 oz (52 kg), SpO2 100 %.    HEENT: No thrush GI: No hepatomegaly, nontender Vascular: No leg edema  Skin: Palms without erythema  Portacath/PICC-without erythema  Lab Results:  Lab Results  Component Value Date   WBC 2.5 (L) 02/18/2019   HGB 10.5 (L) 02/18/2019   HCT 31.7 (L) 02/18/2019   MCV 89.5 02/18/2019   PLT 313 02/18/2019   NEUTROABS 0.9 (L) 02/18/2019    CMP  Lab Results  Component Value Date   NA 138 02/18/2019   K 4.2 02/18/2019   CL 106 02/18/2019   CO2 23 02/18/2019   GLUCOSE 92 02/18/2019   BUN 14 02/18/2019   CREATININE 0.65 02/18/2019   CALCIUM 8.8 (L) 02/18/2019   PROT 6.5 02/18/2019   ALBUMIN 4.2 02/18/2019   AST 19 02/18/2019   ALT 20 02/18/2019   ALKPHOS 120 02/18/2019   BILITOT 0.2 (L) 02/18/2019   GFRNONAA >60 02/18/2019   GFRAA >60 02/18/2019    Lab Results  Component Value Date   CEA1 1.02 01/02/2019    Medications: I have reviewed the patient's current medications.   Assessment/Plan: 1. Intrahepatic cholangiocarcinoma ? Abdominal ultrasound 10/08/2018-heterogenous right hepatic mass ? MRI abdomen 10/14/2018-2 enhancing right liver lesions, 4.8 x 4.5 x 4.1 cm, more superior lesion measured 3 x 2 x 2.5 cm, multiple additional subcentimeter T2 hyperintense lesions-too small to characterize. ? CT chest 11/04/2018-6  mm left upper lobe groundglass nodule, no evidence of metastatic disease in the chest ? Biopsy of right liver lesion at [facility] 11/10/2018-moderately differentiated adenocarcinoma consistent with cholangiocarcinoma; IDH1 alteration; MSI stable; mismatch repair status proficient; tumor mutational burden low, 3; PD-L1 negative ? CTs at [facility] 12/12/2018-right hepatic lobe masses, satellite lesions adjacent to the dominant mass, prominent periportal and aortocaval lymph nodes prominent left supraclavicular node ? Cycle 1 gemcitabine/cisplatin at [facility] on 12/12/2018 ? Cycle 2 gemcitabine/cisplatin 01/02/2019, 01/09/2019 ? Cycle 3 gemcitabine/cisplatin 01/23/2019 ? [facility]s at [facility] 02/02/2019-decrease in size of multiple hepatic masses, decreased size of prominent periportal and aortocaval nodes, unchanged prominent left supraclavicular node ? Cycle 4 gemcitabine/cisplatin 02/11/2019, Udenyca added with day 8 chemotherapy 2. Family history of multiple cancers eluding breast, pancreatic, melanoma, appendiceal cancer, lung cancer 3. Osteoporosis 4. Neutropenia secondary to chemotherapy, Udenyca added with day 8 cycle 4      Disposition: Ms. Roselli appears stable.  She has mild neutropenia secondary to chemotherapy.  We discussed the risk of proceeding with chemotherapy versus a treatment delay.  She would like to keep the treatment on schedule.  The platelet count is higher today and she has not developed severe neutropenia with previous cycles.  She understands the risk of infection.  We decided to proceed with day 8 chemotherapy today.  The gemcitabine will be dose reduced and G-CSF will be added.  We reviewed potential toxicities associated with Udenyca including the chance of  a rash, bone pain, and rupture of the spleen.  She will call for any fever or symptoms of infection.  She will be scheduled for a CBC next week.  Ms. Farro will return for an office visit and the next cycle of chemotherapy  03/04/2019.  [name], MD  02/18/2019  1:55 PM

## 2019-02-18 NOTE — Progress Notes (Signed)
Injection appointment request for tomorrow 02/19/2019 sent to scheduling.

## 2019-02-18 NOTE — Progress Notes (Signed)
Per Dr. Benay Spice, okay to treat with ANC of 0.9, patient to get Udenyca shot tomorrow.

## 2019-02-18 NOTE — Telephone Encounter (Signed)
Scheduled per 1/6 sch msg. Confirmed with nurse that pt will receive a printout

## 2019-02-18 NOTE — Patient Instructions (Signed)
[facility] Discharge Instructions for Patients Receiving Chemotherapy  Today you received the following chemotherapy agents Gemcitabine (GEMZAR) & Cisplatin (PLATINOL).  To help prevent nausea and vomiting after your treatment, we encourage you to take your nausea medication as prescribed.   If you develop nausea and vomiting that is not controlled by your nausea medication, call the clinic.   BELOW ARE SYMPTOMS THAT SHOULD BE REPORTED IMMEDIATELY:  *FEVER GREATER THAN 100.5 F  *CHILLS WITH OR WITHOUT FEVER  NAUSEA AND VOMITING THAT IS NOT CONTROLLED WITH YOUR NAUSEA MEDICATION  *UNUSUAL SHORTNESS OF BREATH  *UNUSUAL BRUISING OR BLEEDING  TENDERNESS IN MOUTH AND THROAT WITH OR WITHOUT PRESENCE OF ULCERS  *URINARY PROBLEMS  *BOWEL PROBLEMS  UNUSUAL RASH Items with * indicate a potential emergency and should be followed up as soon as possible.  Feel free to call the clinic should you have any questions or concerns. The clinic phone number is [phone].  Please show the CHEMO ALERT CARD at check-in to the Emergency Department and triage nurse.  Coronavirus (COVID-19) Are you at risk?  Are you at risk for the Coronavirus (COVID-19)?  To be considered HIGH RISK for Coronavirus (COVID-19), you have to meet the following criteria:  . Traveled to [place], [place], [place], [place] or [place]; or in the [place] to [place], [place], [place], or [place]; and have fever, cough, and shortness of breath within the last 2 weeks of travel OR . Been in close contact with a person diagnosed with COVID-19 within the last 2 weeks and have fever, cough, and shortness of breath . IF YOU DO NOT MEET THESE CRITERIA, YOU ARE CONSIDERED LOW RISK FOR COVID-19.  What to do if you are HIGH RISK for COVID-19?  . If you are having a medical emergency, call 911. . Seek medical care right away. Before you go to a doctor's office, urgent care or emergency department, call  ahead and tell them about your recent travel, contact with someone diagnosed with COVID-19, and your symptoms. You should receive instructions from your physician's office regarding next steps of care.  . When you arrive at healthcare provider, tell the healthcare staff immediately you have returned from visiting [place], [place], [place], [place] or [place]; or traveled in the [place] to [place], [place], [place], or [place]; in the last two weeks or you have been in close contact with a person diagnosed with COVID-19 in the last 2 weeks.   . Tell the health care staff about your symptoms: fever, cough and shortness of breath. . After you have been seen by a medical provider, you will be either: o Tested for (COVID-19) and discharged home on quarantine except to seek medical care if symptoms worsen, and asked to  - Stay home and avoid contact with others until you get your results (4-5 days)  - Avoid travel on public transportation if possible (such as bus, train, or airplane) or o Sent to the Emergency Department by EMS for evaluation, COVID-19 testing, and possible admission depending on your condition and test results.  What to do if you are LOW RISK for COVID-19?  Reduce your risk of any infection by using the same precautions used for avoiding the common cold or flu:  . Wash your hands often with soap and warm water for at least 20 seconds.  If soap and water are not readily available, use an alcohol-based hand sanitizer with at least 60% alcohol.  .   If coughing or sneezing, cover your mouth and nose by coughing or sneezing into the elbow areas of your shirt or coat, into a tissue or into your sleeve (not your hands). . Avoid shaking hands with others and consider head nods or verbal greetings only. . Avoid touching your eyes, nose, or mouth with unwashed hands.  . Avoid close contact with people who are sick. . Avoid places or events with large numbers of people in one location,  like concerts or sporting events. . Carefully consider travel plans you have or are making. . If you are planning any travel outside or inside the [place], visit the CDC's Travelers' Health webpage for the latest health notices. . If you have some symptoms but not all symptoms, continue to monitor at home and seek medical attention if your symptoms worsen. . If you are having a medical emergency, call 911.   ADDITIONAL HEALTHCARE OPTIONS FOR PATIENTS   Telehealth / e-Visit: [email or web address]         [facility]: [phone]  [facility]: [phone]                   [facility]: [phone]    

## 2019-02-19 ENCOUNTER — Other Ambulatory Visit: Payer: Self-pay

## 2019-02-19 ENCOUNTER — Inpatient Hospital Stay: Payer: No Typology Code available for payment source

## 2019-02-19 VITALS — BP 117/62 | HR 80 | Temp 97.2°F | Resp 18

## 2019-02-19 DIAGNOSIS — Z5189 Encounter for other specified aftercare: Secondary | ICD-10-CM | POA: Diagnosis not present

## 2019-02-19 DIAGNOSIS — C221 Intrahepatic bile duct carcinoma: Secondary | ICD-10-CM

## 2019-02-19 MED ORDER — PEGFILGRASTIM-CBQV 6 MG/0.6ML ~~LOC~~ SOSY
6.0000 mg | PREFILLED_SYRINGE | Freq: Once | SUBCUTANEOUS | Status: AC
  Administered 2019-02-19: 6 mg via SUBCUTANEOUS

## 2019-02-19 MED ORDER — PEGFILGRASTIM-CBQV 6 MG/0.6ML ~~LOC~~ SOSY
PREFILLED_SYRINGE | SUBCUTANEOUS | Status: AC
  Filled 2019-02-19: qty 0.6

## 2019-02-19 NOTE — Patient Instructions (Signed)

## 2019-02-20 ENCOUNTER — Telehealth: Payer: Self-pay | Admitting: Oncology

## 2019-02-20 NOTE — Telephone Encounter (Signed)
Scheduled per los. Called and left msg. mailed printout  

## 2019-02-23 ENCOUNTER — Encounter: Payer: Self-pay | Admitting: Oncology

## 2019-02-24 ENCOUNTER — Other Ambulatory Visit: Payer: Self-pay

## 2019-02-24 ENCOUNTER — Inpatient Hospital Stay: Payer: No Typology Code available for payment source

## 2019-02-24 DIAGNOSIS — Z5189 Encounter for other specified aftercare: Secondary | ICD-10-CM | POA: Diagnosis not present

## 2019-02-24 DIAGNOSIS — C221 Intrahepatic bile duct carcinoma: Secondary | ICD-10-CM

## 2019-02-24 LAB — CBC WITH DIFFERENTIAL (CANCER CENTER ONLY)
Abs Immature Granulocytes: 1.54 10*3/uL — ABNORMAL HIGH (ref 0.00–0.07)
Basophils Absolute: 0.1 10*3/uL (ref 0.0–0.1)
Basophils Relative: 1 %
Eosinophils Absolute: 0 10*3/uL (ref 0.0–0.5)
Eosinophils Relative: 0 %
HCT: 33.4 % — ABNORMAL LOW (ref 36.0–46.0)
Hemoglobin: 11 g/dL — ABNORMAL LOW (ref 12.0–15.0)
Immature Granulocytes: 6 %
Lymphocytes Relative: 9 %
Lymphs Abs: 2.3 10*3/uL (ref 0.7–4.0)
MCH: 29.7 pg (ref 26.0–34.0)
MCHC: 32.9 g/dL (ref 30.0–36.0)
MCV: 90.3 fL (ref 80.0–100.0)
Monocytes Absolute: 1.4 10*3/uL — ABNORMAL HIGH (ref 0.1–1.0)
Monocytes Relative: 6 %
Neutro Abs: 19 10*3/uL — ABNORMAL HIGH (ref 1.7–7.7)
Neutrophils Relative %: 78 %
Platelet Count: 110 10*3/uL — ABNORMAL LOW (ref 150–400)
RBC: 3.7 MIL/uL — ABNORMAL LOW (ref 3.87–5.11)
RDW: 16.6 % — ABNORMAL HIGH (ref 11.5–15.5)
WBC Count: 24.4 10*3/uL — ABNORMAL HIGH (ref 4.0–10.5)
nRBC: 0 % (ref 0.0–0.2)

## 2019-02-24 LAB — CMP (CANCER CENTER ONLY)
ALT: 17 U/L (ref 0–44)
AST: 15 U/L (ref 15–41)
Albumin: 4.1 g/dL (ref 3.5–5.0)
Alkaline Phosphatase: 221 U/L — ABNORMAL HIGH (ref 38–126)
Anion gap: 10 (ref 5–15)
BUN: 13 mg/dL (ref 8–23)
CO2: 25 mmol/L (ref 22–32)
Calcium: 8.6 mg/dL — ABNORMAL LOW (ref 8.9–10.3)
Chloride: 102 mmol/L (ref 98–111)
Creatinine: 0.72 mg/dL (ref 0.44–1.00)
GFR, Est AFR Am: >60 mL/min (ref >60)
GFR, Estimated: >60 mL/min (ref >60)
Glucose, Bld: 99 mg/dL (ref 70–99)
Potassium: 4.2 mmol/L (ref 3.5–5.1)
Sodium: 137 mmol/L (ref 135–145)
Total Bilirubin: <0.2 mg/dL — ABNORMAL LOW (ref 0.3–1.2)
Total Protein: 6.5 g/dL (ref 6.5–8.1)

## 2019-02-28 ENCOUNTER — Other Ambulatory Visit: Payer: Self-pay | Admitting: Oncology

## 2019-03-04 ENCOUNTER — Inpatient Hospital Stay: Payer: No Typology Code available for payment source

## 2019-03-04 ENCOUNTER — Inpatient Hospital Stay: Payer: No Typology Code available for payment source | Admitting: Nutrition

## 2019-03-04 ENCOUNTER — Other Ambulatory Visit: Payer: Self-pay

## 2019-03-04 ENCOUNTER — Inpatient Hospital Stay (HOSPITAL_BASED_OUTPATIENT_CLINIC_OR_DEPARTMENT_OTHER): Payer: No Typology Code available for payment source | Admitting: Oncology

## 2019-03-04 VITALS — BP 113/65 | HR 83 | Temp 97.7°F | Resp 18 | Ht 64.0 in | Wt 112.9 lb

## 2019-03-04 DIAGNOSIS — Z95828 Presence of other vascular implants and grafts: Secondary | ICD-10-CM

## 2019-03-04 DIAGNOSIS — Z5189 Encounter for other specified aftercare: Secondary | ICD-10-CM | POA: Diagnosis not present

## 2019-03-04 DIAGNOSIS — C221 Intrahepatic bile duct carcinoma: Secondary | ICD-10-CM

## 2019-03-04 LAB — CMP (CANCER CENTER ONLY)
ALT: 11 U/L (ref 0–44)
AST: 17 U/L (ref 15–41)
Albumin: 4.2 g/dL (ref 3.5–5.0)
Alkaline Phosphatase: 140 U/L — ABNORMAL HIGH (ref 38–126)
Anion gap: 9 (ref 5–15)
BUN: 10 mg/dL (ref 8–23)
CO2: 23 mmol/L (ref 22–32)
Calcium: 8.7 mg/dL — ABNORMAL LOW (ref 8.9–10.3)
Chloride: 106 mmol/L (ref 98–111)
Creatinine: 0.64 mg/dL (ref 0.44–1.00)
GFR, Est AFR Am: >60 mL/min (ref >60)
GFR, Estimated: >60 mL/min (ref >60)
Glucose, Bld: 98 mg/dL (ref 70–99)
Potassium: 4.3 mmol/L (ref 3.5–5.1)
Sodium: 138 mmol/L (ref 135–145)
Total Bilirubin: 0.4 mg/dL (ref 0.3–1.2)
Total Protein: 6.6 g/dL (ref 6.5–8.1)

## 2019-03-04 LAB — CBC WITH DIFFERENTIAL (CANCER CENTER ONLY)
Abs Immature Granulocytes: 0.06 10*3/uL (ref 0.00–0.07)
Basophils Absolute: 0 10*3/uL (ref 0.0–0.1)
Basophils Relative: 1 %
Eosinophils Absolute: 0 10*3/uL (ref 0.0–0.5)
Eosinophils Relative: 0 %
HCT: 32.8 % — ABNORMAL LOW (ref 36.0–46.0)
Hemoglobin: 10.7 g/dL — ABNORMAL LOW (ref 12.0–15.0)
Immature Granulocytes: 2 %
Lymphocytes Relative: 22 %
Lymphs Abs: 0.9 10*3/uL (ref 0.7–4.0)
MCH: 29.8 pg (ref 26.0–34.0)
MCHC: 32.6 g/dL (ref 30.0–36.0)
MCV: 91.4 fL (ref 80.0–100.0)
Monocytes Absolute: 0.4 10*3/uL (ref 0.1–1.0)
Monocytes Relative: 11 %
Neutro Abs: 2.6 10*3/uL (ref 1.7–7.7)
Neutrophils Relative %: 64 %
Platelet Count: 287 10*3/uL (ref 150–400)
RBC: 3.59 MIL/uL — ABNORMAL LOW (ref 3.87–5.11)
RDW: 18.2 % — ABNORMAL HIGH (ref 11.5–15.5)
WBC Count: 4 10*3/uL (ref 4.0–10.5)
nRBC: 0 % (ref 0.0–0.2)

## 2019-03-04 LAB — MAGNESIUM: Magnesium: 1.7 mg/dL (ref 1.7–2.4)

## 2019-03-04 MED ORDER — HEPARIN SOD (PORK) LOCK FLUSH 100 UNIT/ML IV SOLN
500.0000 [IU] | Freq: Once | INTRAVENOUS | Status: AC | PRN
  Administered 2019-03-04: 17:00:00 500 [IU]
  Filled 2019-03-04: qty 5

## 2019-03-04 MED ORDER — SODIUM CHLORIDE 0.9% FLUSH
10.0000 mL | INTRAVENOUS | Status: DC | PRN
  Administered 2019-03-04: 17:00:00 10 mL
  Filled 2019-03-04: qty 10

## 2019-03-04 MED ORDER — SODIUM CHLORIDE 0.9 % IV SOLN
1000.0000 mg/m2 | Freq: Once | INTRAVENOUS | Status: AC
  Administered 2019-03-04: 13:00:00 1482 mg via INTRAVENOUS
  Filled 2019-03-04: qty 38.98

## 2019-03-04 MED ORDER — SODIUM CHLORIDE 0.9 % IV SOLN
Freq: Once | INTRAVENOUS | Status: AC
  Filled 2019-03-04: qty 250

## 2019-03-04 MED ORDER — POTASSIUM CHLORIDE 2 MEQ/ML IV SOLN
Freq: Once | INTRAVENOUS | Status: AC
  Filled 2019-03-04: qty 10

## 2019-03-04 MED ORDER — SODIUM CHLORIDE 0.9 % IV SOLN
Freq: Once | INTRAVENOUS | Status: AC
  Filled 2019-03-04: qty 5

## 2019-03-04 MED ORDER — SODIUM CHLORIDE 0.9 % IV SOLN
25.0000 mg/m2 | Freq: Once | INTRAVENOUS | Status: AC
  Administered 2019-03-04: 14:00:00 38 mg via INTRAVENOUS
  Filled 2019-03-04: qty 38

## 2019-03-04 MED ORDER — SODIUM CHLORIDE 0.9% FLUSH
10.0000 mL | INTRAVENOUS | Status: DC | PRN
  Administered 2019-03-04: 10 mL
  Filled 2019-03-04: qty 10

## 2019-03-04 MED ORDER — PALONOSETRON HCL INJECTION 0.25 MG/5ML
INTRAVENOUS | Status: AC
  Filled 2019-03-04: qty 5

## 2019-03-04 MED ORDER — PALONOSETRON HCL INJECTION 0.25 MG/5ML
0.2500 mg | Freq: Once | INTRAVENOUS | Status: AC
  Administered 2019-03-04: 12:00:00 0.25 mg via INTRAVENOUS

## 2019-03-04 NOTE — Progress Notes (Signed)
[facility] OFFICE PROGRESS NOTE   Diagnosis: Cholangiocarcinoma  INTERVAL HISTORY:   Angela Fleming completed another cycle of gemcitabine/cisplatin beginning 02/11/2019.  She received Udenyca on day 9.  She tolerated Udenyca well.  No bone pain.  No nausea/vomiting or neuropathy symptoms.  She had a feeling of fluid on the ears last week, this improves with decongestants.  No hearing loss.  She has noted increased fullness in the right upper abdomen.  Mild constipation is relieved with Colace.  Objective:  Vital signs in last 24 hours:  Blood pressure 113/65, pulse 83, temperature 97.7 F (36.5 C), temperature source Temporal, resp. rate 18, height 5' 4"  (1.626 m), weight 112 lb 14.4 oz (51.2 kg), SpO2 100 %.    HEENT: No thrush or ulcers  GI: No hepatomegaly, no mass, slight fullness in the lateral right upper abdomen-? Vascular: No leg edema  Skin: Mild erythema at the distal fingers  Portacath/PICC-without erythema  Lab Results:  Lab Results  Component Value Date   WBC 24.4 (H) 02/24/2019   HGB 11.0 (L) 02/24/2019   HCT 33.4 (L) 02/24/2019   MCV 90.3 02/24/2019   PLT 110 (L) 02/24/2019   NEUTROABS 19.0 (H) 02/24/2019    CMP  Lab Results  Component Value Date   NA 137 02/24/2019   K 4.2 02/24/2019   CL 102 02/24/2019   CO2 25 02/24/2019   GLUCOSE 99 02/24/2019   BUN 13 02/24/2019   CREATININE 0.72 02/24/2019   CALCIUM 8.6 (L) 02/24/2019   PROT 6.5 02/24/2019   ALBUMIN 4.1 02/24/2019   AST 15 02/24/2019   ALT 17 02/24/2019   ALKPHOS 221 (H) 02/24/2019   BILITOT <0.2 (L) 02/24/2019   GFRNONAA >60 02/24/2019   GFRAA >60 02/24/2019    Lab Results  Component Value Date   CEA1 1.02 01/02/2019     Medications: I have reviewed the patient's current medications.   Assessment/Plan: 1. Intrahepatic cholangiocarcinoma ? Abdominal ultrasound 10/08/2018-heterogenous right hepatic mass ? MRI abdomen 10/14/2018-2 enhancing right liver lesions, 4.8 x  4.5 x 4.1 cm, more superior lesion measured 3 x 2 x 2.5 cm, multiple additional subcentimeter T2 hyperintense lesions-too small to characterize. ? CT chest 11/04/2018-6 mm left upper lobe groundglass nodule, no evidence of metastatic disease in the chest ? Biopsy of right liver lesion at [facility] 11/10/2018-moderately differentiated adenocarcinoma consistent with cholangiocarcinoma; IDH1 alteration; MSI stable; mismatch repair status proficient; tumor mutational burden low, 3; PD-L1 negative ? CTs at [facility] 12/12/2018-right hepatic lobe masses, satellite lesions adjacent to the dominant mass, prominent periportal and aortocaval lymph nodes prominent left supraclavicular node ? Cycle 1 gemcitabine/cisplatin at [facility] on 12/12/2018 ? Cycle 2 gemcitabine/cisplatin 01/02/2019, 01/09/2019 ? Cycle 3 gemcitabine/cisplatin 01/23/2019 ? CTs at [facility] 02/02/2019-decrease in size of multiple hepatic masses, decreased size of prominent periportal and aortocaval nodes, unchanged prominent left supraclavicular node ? Cycle 4 gemcitabine/cisplatin 02/11/2019, Udenyca added and gemcitabine dose reduced with day 8 chemotherapy ? Cycle 5 gemcitabine/cisplatin, gemcitabine reescalated to full dose 2. Family history of multiple cancers eluding breast, pancreatic, melanoma, appendiceal cancer, lung cancer 3. Osteoporosis 4. Neutropenia secondary to chemotherapy, Udenyca added with day 8 cycle 4   Disposition: Angela Fleming appears unchanged.  She is tolerating the gemcitabine/cisplatin well.  She tolerated Udenyca well.  The plan is to proceed with cycle 5 gemcitabine/cisplatin today.  The gemcitabine will be reescalated to full dose.   She reports increased fullness in the right upper abdomen.  I cannot feel a discrete liver  edge or mass.  She will let us know if the fullness progresses.  [name], MD  03/04/2019  8:48 AM

## 2019-03-04 NOTE — Patient Instructions (Signed)
[facility] Discharge Instructions for Patients Receiving Chemotherapy  Today you received the following chemotherapy agents Gemcitabine (GEMZAR) & Cisplatin (PLATINOL).  To help prevent nausea and vomiting after your treatment, we encourage you to take your nausea medication as prescribed.  If you develop nausea and vomiting that is not controlled by your nausea medication, call the clinic.   BELOW ARE SYMPTOMS THAT SHOULD BE REPORTED IMMEDIATELY:  *FEVER GREATER THAN 100.5 F  *CHILLS WITH OR WITHOUT FEVER  NAUSEA AND VOMITING THAT IS NOT CONTROLLED WITH YOUR NAUSEA MEDICATION  *UNUSUAL SHORTNESS OF BREATH  *UNUSUAL BRUISING OR BLEEDING  TENDERNESS IN MOUTH AND THROAT WITH OR WITHOUT PRESENCE OF ULCERS  *URINARY PROBLEMS  *BOWEL PROBLEMS  UNUSUAL RASH Items with * indicate a potential emergency and should be followed up as soon as possible.  Feel free to call the clinic should you have any questions or concerns. The clinic phone number is [phone].  Please show the CHEMO ALERT CARD at check-in to the Emergency Department and triage nurse.   

## 2019-03-04 NOTE — Progress Notes (Signed)
Nutrition follow-up completed with patient receiving treatment for cholangiocarcinoma. Weight decreased slightly and was documented as 112.9 pounds January 20 down from 114.6 pounds January 6. Patient has mild constipation which is relieved with Colace. Patient continues to struggle with finding foods she enjoys eating. She has tried smoothies in the past but is getting tired of them. She continues to try to consume healthier foods and beverages.  Nutrition diagnosis: Unintended weight loss continues.  Nutrition intervention. Patient was educated on a variety of high-calorie, high-protein foods and encourage small amounts throughout the day. Provided samples of healthier oral nutrition supplements such as orgain and [employer]. Provided recipes for healthy smoothies. Teach back method used.  Monitoring, evaluation, goals: Patient will tolerate increased calories and protein to minimize weight loss.  Next visit: Wednesday, February 10 during infusion.  **Disclaimer: This note was dictated with voice recognition software. Similar sounding words can inadvertently be transcribed and this note may contain transcription errors which may not have been corrected upon publication of note.**

## 2019-03-04 NOTE — Patient Instructions (Signed)

## 2019-03-08 ENCOUNTER — Other Ambulatory Visit: Payer: Self-pay | Admitting: Oncology

## 2019-03-11 ENCOUNTER — Inpatient Hospital Stay: Payer: No Typology Code available for payment source

## 2019-03-11 ENCOUNTER — Inpatient Hospital Stay (HOSPITAL_BASED_OUTPATIENT_CLINIC_OR_DEPARTMENT_OTHER): Payer: No Typology Code available for payment source | Admitting: Oncology

## 2019-03-11 ENCOUNTER — Other Ambulatory Visit: Payer: Self-pay

## 2019-03-11 VITALS — BP 123/68 | HR 83 | Temp 97.9°F | Resp 16 | Ht 64.0 in | Wt 114.1 lb

## 2019-03-11 DIAGNOSIS — Z5189 Encounter for other specified aftercare: Secondary | ICD-10-CM | POA: Diagnosis not present

## 2019-03-11 DIAGNOSIS — C221 Intrahepatic bile duct carcinoma: Secondary | ICD-10-CM

## 2019-03-11 DIAGNOSIS — Z95828 Presence of other vascular implants and grafts: Secondary | ICD-10-CM

## 2019-03-11 LAB — CBC WITH DIFFERENTIAL (CANCER CENTER ONLY)
Abs Immature Granulocytes: 0.1 10*3/uL — ABNORMAL HIGH (ref 0.00–0.07)
Basophils Absolute: 0 10*3/uL (ref 0.0–0.1)
Basophils Relative: 1 %
Eosinophils Absolute: 0 10*3/uL (ref 0.0–0.5)
Eosinophils Relative: 1 %
HCT: 32.6 % — ABNORMAL LOW (ref 36.0–46.0)
Hemoglobin: 10.6 g/dL — ABNORMAL LOW (ref 12.0–15.0)
Immature Granulocytes: 3 %
Lymphocytes Relative: 38 %
Lymphs Abs: 1.3 10*3/uL (ref 0.7–4.0)
MCH: 29.4 pg (ref 26.0–34.0)
MCHC: 32.5 g/dL (ref 30.0–36.0)
MCV: 90.6 fL (ref 80.0–100.0)
Monocytes Absolute: 0.2 10*3/uL (ref 0.1–1.0)
Monocytes Relative: 6 %
Neutro Abs: 1.7 10*3/uL (ref 1.7–7.7)
Neutrophils Relative %: 51 %
Platelet Count: 296 10*3/uL (ref 150–400)
RBC: 3.6 MIL/uL — ABNORMAL LOW (ref 3.87–5.11)
RDW: 17 % — ABNORMAL HIGH (ref 11.5–15.5)
WBC Count: 3.4 10*3/uL — ABNORMAL LOW (ref 4.0–10.5)
nRBC: 0 % (ref 0.0–0.2)

## 2019-03-11 LAB — CMP (CANCER CENTER ONLY)
ALT: 16 U/L (ref 0–44)
AST: 15 U/L (ref 15–41)
Albumin: 4.2 g/dL (ref 3.5–5.0)
Alkaline Phosphatase: 116 U/L (ref 38–126)
Anion gap: 9 (ref 5–15)
BUN: 12 mg/dL (ref 8–23)
CO2: 23 mmol/L (ref 22–32)
Calcium: 8.8 mg/dL — ABNORMAL LOW (ref 8.9–10.3)
Chloride: 106 mmol/L (ref 98–111)
Creatinine: 0.66 mg/dL (ref 0.44–1.00)
GFR, Est AFR Am: >60 mL/min (ref >60)
GFR, Estimated: >60 mL/min (ref >60)
Glucose, Bld: 94 mg/dL (ref 70–99)
Potassium: 4.4 mmol/L (ref 3.5–5.1)
Sodium: 138 mmol/L (ref 135–145)
Total Bilirubin: 0.2 mg/dL — ABNORMAL LOW (ref 0.3–1.2)
Total Protein: 6.5 g/dL (ref 6.5–8.1)

## 2019-03-11 LAB — MAGNESIUM: Magnesium: 1.8 mg/dL (ref 1.7–2.4)

## 2019-03-11 MED ORDER — POTASSIUM CHLORIDE 2 MEQ/ML IV SOLN
Freq: Once | INTRAVENOUS | Status: AC
  Filled 2019-03-11: qty 10

## 2019-03-11 MED ORDER — SODIUM CHLORIDE 0.9 % IV SOLN
1000.0000 mg/m2 | Freq: Once | INTRAVENOUS | Status: AC
  Administered 2019-03-11: 1482 mg via INTRAVENOUS
  Filled 2019-03-11: qty 38.98

## 2019-03-11 MED ORDER — PALONOSETRON HCL INJECTION 0.25 MG/5ML
0.2500 mg | Freq: Once | INTRAVENOUS | Status: AC
  Administered 2019-03-11: 12:00:00 0.25 mg via INTRAVENOUS

## 2019-03-11 MED ORDER — PALONOSETRON HCL INJECTION 0.25 MG/5ML
INTRAVENOUS | Status: AC
  Filled 2019-03-11: qty 5

## 2019-03-11 MED ORDER — SODIUM CHLORIDE 0.9% FLUSH
10.0000 mL | INTRAVENOUS | Status: DC | PRN
  Administered 2019-03-11: 10 mL
  Filled 2019-03-11: qty 10

## 2019-03-11 MED ORDER — SODIUM CHLORIDE 0.9 % IV SOLN
Freq: Once | INTRAVENOUS | Status: AC
  Filled 2019-03-11: qty 250

## 2019-03-11 MED ORDER — SODIUM CHLORIDE 0.9 % IV SOLN
Freq: Once | INTRAVENOUS | Status: AC
  Filled 2019-03-11: qty 5

## 2019-03-11 MED ORDER — HEPARIN SOD (PORK) LOCK FLUSH 100 UNIT/ML IV SOLN
500.0000 [IU] | Freq: Once | INTRAVENOUS | Status: AC | PRN
  Administered 2019-03-11: 17:00:00 500 [IU]
  Filled 2019-03-11: qty 5

## 2019-03-11 MED ORDER — SODIUM CHLORIDE 0.9% FLUSH
10.0000 mL | INTRAVENOUS | Status: DC | PRN
  Administered 2019-03-11: 17:00:00 10 mL
  Filled 2019-03-11: qty 10

## 2019-03-11 MED ORDER — SODIUM CHLORIDE 0.9 % IV SOLN
25.0000 mg/m2 | Freq: Once | INTRAVENOUS | Status: AC
  Administered 2019-03-11: 14:00:00 38 mg via INTRAVENOUS
  Filled 2019-03-11: qty 38

## 2019-03-11 NOTE — Patient Instructions (Signed)

## 2019-03-11 NOTE — Patient Instructions (Signed)
[facility] Discharge Instructions for Patients Receiving Chemotherapy  Today you received the following chemotherapy agents Cisplatin and Gemzar.  To help prevent nausea and vomiting after your treatment, we encourage you to take your nausea medication as directed BUT NO ZOFRAN FOR 3 DAYS POST CHEMO.   If you develop nausea and vomiting that is not controlled by your nausea medication, call the clinic.   BELOW ARE SYMPTOMS THAT SHOULD BE REPORTED IMMEDIATELY:  *FEVER GREATER THAN 100.5 F  *CHILLS WITH OR WITHOUT FEVER  NAUSEA AND VOMITING THAT IS NOT CONTROLLED WITH YOUR NAUSEA MEDICATION  *UNUSUAL SHORTNESS OF BREATH  *UNUSUAL BRUISING OR BLEEDING  TENDERNESS IN MOUTH AND THROAT WITH OR WITHOUT PRESENCE OF ULCERS  *URINARY PROBLEMS  *BOWEL PROBLEMS  UNUSUAL RASH Items with * indicate a potential emergency and should be followed up as soon as possible.  Feel free to call the clinic you have any questions or concerns. The clinic phone number is [phone].  Please show the [place] at check-in to the Emergency Department and triage nurse.

## 2019-03-11 NOTE — Progress Notes (Signed)
  [facility] OFFICE PROGRESS NOTE   Diagnosis: Cholangiocarcinoma  INTERVAL HISTORY:   Angela Fleming completed day 1 cycle 4 chemotherapy on 03/04/2019.  She reports tolerating chemotherapy well.  No fever, rash, nausea, or neuropathy symptoms.  No visual symptoms.  She no longer has discomfort or fullness in the right abdomen.  No new complaint.  Objective:  Vital signs in last 24 hours:  Blood pressure 123/68, pulse 83, temperature 97.9 F (36.6 C), temperature source Temporal, resp. rate 16, height '5\' 4"'$  (1.626 m), weight 114 lb 1.6 oz (51.8 kg), SpO2 100 %.    Limited physical examination secondary to distancing with the Covid pandemic GI: No hepatomegaly, no mass, nontender Vascular: No leg edema  Skin: Palms without erythema  Portacath/PICC-without erythema   Lab Results:  Lab Results  Component Value Date   WBC 3.4 (L) 03/11/2019   HGB 10.6 (L) 03/11/2019   HCT 32.6 (L) 03/11/2019   MCV 90.6 03/11/2019   PLT 296 03/11/2019   NEUTROABS 1.7 03/11/2019    CMP  Lab Results  Component Value Date   NA 138 03/11/2019   K 4.4 03/11/2019   CL 106 03/11/2019   CO2 23 03/11/2019   GLUCOSE 94 03/11/2019   BUN 12 03/11/2019   CREATININE 0.66 03/11/2019   CALCIUM 8.8 (L) 03/11/2019   PROT 6.5 03/11/2019   ALBUMIN 4.2 03/11/2019   AST 15 03/11/2019   ALT 16 03/11/2019   ALKPHOS 116 03/11/2019   BILITOT 0.2 (L) 03/11/2019   GFRNONAA >60 03/11/2019   GFRAA >60 03/11/2019     Medications: I have reviewed the patient's current medications.   Assessment/Plan: 1. Intrahepatic cholangiocarcinoma ? Abdominal ultrasound 10/08/2018-heterogenous right hepatic mass ? MRI abdomen 10/14/2018-2 enhancing right liver lesions, 4.8 x 4.5 x 4.1 cm, more superior lesion measured 3 x 2 x 2.5 cm, multiple additional subcentimeter T2 hyperintense lesions-too small to characterize. ? CT chest 11/04/2018-6 mm left upper lobe groundglass nodule, no evidence of metastatic disease  in the chest ? Biopsy of right liver lesion at [facility] 11/10/2018-moderately differentiated adenocarcinoma consistent with cholangiocarcinoma; IDH1 alteration; MSI stable; mismatch repair status proficient; tumor mutational burden low, 3; PD-L1 negative ? CTs at [facility] 12/12/2018-right hepatic lobe masses, satellite lesions adjacent to the dominant mass, prominent periportal and aortocaval lymph nodes prominent left supraclavicular node ? Cycle 1 gemcitabine/cisplatin at [facility] on 12/12/2018 ? Cycle 2 gemcitabine/cisplatin 01/02/2019, 01/09/2019 ? Cycle 3 gemcitabine/cisplatin 01/23/2019 ? CTs at [facility] 02/02/2019-decrease in size of multiple hepatic masses, decreased size of prominent periportal and aortocaval nodes, unchanged prominent left supraclavicular node ? Cycle 4 gemcitabine/cisplatin 02/11/2019, Udenyca added and gemcitabine dose reduced with day 8 chemotherapy ? Cycle 5 gemcitabine/cisplatin 03/04/2019, gemcitabine reescalated to full dose 2. Family history of multiple cancers eluding breast, pancreatic, melanoma, appendiceal cancer, lung cancer 3. Osteoporosis 4. Neutropenia secondary to chemotherapy, Udenyca added with day 8 cycle 4     Disposition: [name] appears well.  She will complete day 8 of cycle 5 chemotherapy today.  She will receive Udenyca tomorrow.  She will return for an office visit and cycle 6 chemotherapy on 03/25/2019.  Angela Coder, MD  03/11/2019  10:46 AM

## 2019-03-12 ENCOUNTER — Other Ambulatory Visit: Payer: Self-pay

## 2019-03-12 ENCOUNTER — Inpatient Hospital Stay: Payer: No Typology Code available for payment source

## 2019-03-12 VITALS — BP 129/54 | HR 89 | Temp 97.3°F | Resp 18

## 2019-03-12 DIAGNOSIS — C221 Intrahepatic bile duct carcinoma: Secondary | ICD-10-CM

## 2019-03-12 DIAGNOSIS — Z5189 Encounter for other specified aftercare: Secondary | ICD-10-CM | POA: Diagnosis not present

## 2019-03-12 MED ORDER — PEGFILGRASTIM-CBQV 6 MG/0.6ML ~~LOC~~ SOSY
PREFILLED_SYRINGE | SUBCUTANEOUS | Status: AC
  Filled 2019-03-12: qty 0.6

## 2019-03-12 MED ORDER — PEGFILGRASTIM-CBQV 6 MG/0.6ML ~~LOC~~ SOSY
6.0000 mg | PREFILLED_SYRINGE | Freq: Once | SUBCUTANEOUS | Status: AC
  Administered 2019-03-12: 6 mg via SUBCUTANEOUS

## 2019-03-22 ENCOUNTER — Other Ambulatory Visit: Payer: Self-pay | Admitting: Oncology

## 2019-03-25 ENCOUNTER — Inpatient Hospital Stay: Payer: No Typology Code available for payment source | Admitting: Nutrition

## 2019-03-25 ENCOUNTER — Inpatient Hospital Stay: Payer: No Typology Code available for payment source

## 2019-03-25 ENCOUNTER — Inpatient Hospital Stay: Payer: No Typology Code available for payment source | Attending: Oncology | Admitting: Oncology

## 2019-03-25 ENCOUNTER — Other Ambulatory Visit: Payer: Self-pay

## 2019-03-25 VITALS — BP 114/63 | HR 86 | Temp 98.4°F | Resp 17 | Ht 64.0 in | Wt 114.6 lb

## 2019-03-25 DIAGNOSIS — M810 Age-related osteoporosis without current pathological fracture: Secondary | ICD-10-CM | POA: Insufficient documentation

## 2019-03-25 DIAGNOSIS — D701 Agranulocytosis secondary to cancer chemotherapy: Secondary | ICD-10-CM | POA: Insufficient documentation

## 2019-03-25 DIAGNOSIS — C221 Intrahepatic bile duct carcinoma: Secondary | ICD-10-CM

## 2019-03-25 DIAGNOSIS — Z803 Family history of malignant neoplasm of breast: Secondary | ICD-10-CM | POA: Diagnosis not present

## 2019-03-25 DIAGNOSIS — T451X5A Adverse effect of antineoplastic and immunosuppressive drugs, initial encounter: Secondary | ICD-10-CM | POA: Insufficient documentation

## 2019-03-25 DIAGNOSIS — Z800 Family history of malignant neoplasm of digestive organs: Secondary | ICD-10-CM | POA: Insufficient documentation

## 2019-03-25 DIAGNOSIS — Z5189 Encounter for other specified aftercare: Secondary | ICD-10-CM | POA: Insufficient documentation

## 2019-03-25 DIAGNOSIS — Z5111 Encounter for antineoplastic chemotherapy: Secondary | ICD-10-CM | POA: Diagnosis not present

## 2019-03-25 DIAGNOSIS — Z801 Family history of malignant neoplasm of trachea, bronchus and lung: Secondary | ICD-10-CM | POA: Insufficient documentation

## 2019-03-25 DIAGNOSIS — R911 Solitary pulmonary nodule: Secondary | ICD-10-CM | POA: Diagnosis not present

## 2019-03-25 DIAGNOSIS — Z95828 Presence of other vascular implants and grafts: Secondary | ICD-10-CM

## 2019-03-25 LAB — CBC WITH DIFFERENTIAL (CANCER CENTER ONLY)
Abs Immature Granulocytes: 0.07 10*3/uL (ref 0.00–0.07)
Basophils Absolute: 0.1 10*3/uL (ref 0.0–0.1)
Basophils Relative: 1 %
Eosinophils Absolute: 0 10*3/uL (ref 0.0–0.5)
Eosinophils Relative: 0 %
HCT: 33.4 % — ABNORMAL LOW (ref 36.0–46.0)
Hemoglobin: 10.8 g/dL — ABNORMAL LOW (ref 12.0–15.0)
Immature Granulocytes: 1 %
Lymphocytes Relative: 18 %
Lymphs Abs: 1.2 10*3/uL (ref 0.7–4.0)
MCH: 30.6 pg (ref 26.0–34.0)
MCHC: 32.3 g/dL (ref 30.0–36.0)
MCV: 94.6 fL (ref 80.0–100.0)
Monocytes Absolute: 0.5 10*3/uL (ref 0.1–1.0)
Monocytes Relative: 8 %
Neutro Abs: 4.6 10*3/uL (ref 1.7–7.7)
Neutrophils Relative %: 72 %
Platelet Count: 273 10*3/uL (ref 150–400)
RBC: 3.53 MIL/uL — ABNORMAL LOW (ref 3.87–5.11)
RDW: 17.2 % — ABNORMAL HIGH (ref 11.5–15.5)
WBC Count: 6.5 10*3/uL (ref 4.0–10.5)
nRBC: 0 % (ref 0.0–0.2)

## 2019-03-25 LAB — CMP (CANCER CENTER ONLY)
ALT: 13 U/L (ref 0–44)
AST: 16 U/L (ref 15–41)
Albumin: 4.2 g/dL (ref 3.5–5.0)
Alkaline Phosphatase: 152 U/L — ABNORMAL HIGH (ref 38–126)
Anion gap: 9 (ref 5–15)
BUN: 11 mg/dL (ref 8–23)
CO2: 24 mmol/L (ref 22–32)
Calcium: 9 mg/dL (ref 8.9–10.3)
Chloride: 107 mmol/L (ref 98–111)
Creatinine: 0.64 mg/dL (ref 0.44–1.00)
GFR, Est AFR Am: >60 mL/min (ref >60)
GFR, Estimated: >60 mL/min (ref >60)
Glucose, Bld: 104 mg/dL — ABNORMAL HIGH (ref 70–99)
Potassium: 4.3 mmol/L (ref 3.5–5.1)
Sodium: 140 mmol/L (ref 135–145)
Total Bilirubin: 0.3 mg/dL (ref 0.3–1.2)
Total Protein: 6.6 g/dL (ref 6.5–8.1)

## 2019-03-25 LAB — MAGNESIUM: Magnesium: 1.8 mg/dL (ref 1.7–2.4)

## 2019-03-25 MED ORDER — HEPARIN SOD (PORK) LOCK FLUSH 100 UNIT/ML IV SOLN
500.0000 [IU] | Freq: Once | INTRAVENOUS | Status: AC | PRN
  Administered 2019-03-25: 500 [IU]
  Filled 2019-03-25: qty 5

## 2019-03-25 MED ORDER — SODIUM CHLORIDE 0.9 % IV SOLN
1000.0000 mg/m2 | Freq: Once | INTRAVENOUS | Status: AC
  Administered 2019-03-25: 1482 mg via INTRAVENOUS
  Filled 2019-03-25: qty 38.98

## 2019-03-25 MED ORDER — SODIUM CHLORIDE 0.9 % IV SOLN
Freq: Once | INTRAVENOUS | Status: AC
  Filled 2019-03-25: qty 250

## 2019-03-25 MED ORDER — SODIUM CHLORIDE 0.9 % IV SOLN
25.0000 mg/m2 | Freq: Once | INTRAVENOUS | Status: AC
  Administered 2019-03-25: 38 mg via INTRAVENOUS
  Filled 2019-03-25: qty 38

## 2019-03-25 MED ORDER — SODIUM CHLORIDE 0.9% FLUSH
10.0000 mL | INTRAVENOUS | Status: DC | PRN
  Administered 2019-03-25: 10 mL
  Filled 2019-03-25: qty 10

## 2019-03-25 MED ORDER — SODIUM CHLORIDE 0.9 % IV SOLN
Freq: Once | INTRAVENOUS | Status: AC
  Filled 2019-03-25: qty 5

## 2019-03-25 MED ORDER — PALONOSETRON HCL INJECTION 0.25 MG/5ML
0.2500 mg | Freq: Once | INTRAVENOUS | Status: AC
  Administered 2019-03-25: 0.25 mg via INTRAVENOUS

## 2019-03-25 MED ORDER — POTASSIUM CHLORIDE 2 MEQ/ML IV SOLN
Freq: Once | INTRAVENOUS | Status: AC
  Filled 2019-03-25: qty 10

## 2019-03-25 MED ORDER — PALONOSETRON HCL INJECTION 0.25 MG/5ML
INTRAVENOUS | Status: AC
  Filled 2019-03-25: qty 5

## 2019-03-25 NOTE — Progress Notes (Signed)
  [place] OFFICE PROGRESS NOTE   Diagnosis: Cholangiocarcinoma  INTERVAL HISTORY:   Ms. Hosterman completed day 8 gemcitabine/cisplatin on 03/11/2019.  She reports back discomfort for a few days after receiving Neulasta./Vomiting, fever, likely symptoms.  She had malaise last week and feels better now.  Objective:  Vital signs in last 24 hours:  Blood pressure 114/63, pulse 86, temperature 98.4 F (36.9 C), resp. rate 17, height '5\' 4"'$  (1.626 m), weight 114 lb 9.6 oz (52 kg), SpO2 100 %.    HEENT: No thrush or ulcers GI: No hepatosplenomegaly nontender Vascular: No leg edema  Skin: Palms without erythema  Portacath/PICC-without erythema  Lab Results:  Lab Results  Component Value Date   WBC 6.5 03/25/2019   HGB 10.8 (L) 03/25/2019   HCT 33.4 (L) 03/25/2019   MCV 94.6 03/25/2019   PLT 273 03/25/2019   NEUTROABS 4.6 03/25/2019    CMP  Lab Results  Component Value Date   NA 138 03/11/2019   K 4.4 03/11/2019   CL 106 03/11/2019   CO2 23 03/11/2019   GLUCOSE 94 03/11/2019   BUN 12 03/11/2019   CREATININE 0.66 03/11/2019   CALCIUM 8.8 (L) 03/11/2019   PROT 6.5 03/11/2019   ALBUMIN 4.2 03/11/2019   AST 15 03/11/2019   ALT 16 03/11/2019   ALKPHOS 116 03/11/2019   BILITOT 0.2 (L) 03/11/2019   GFRNONAA >60 03/11/2019   GFRAA >60 03/11/2019    Lab Results  Component Value Date   CEA1 1.02 01/02/2019     Medications: I have reviewed the patient's current medications.   Assessment/Plan: 1. Intrahepatic cholangiocarcinoma ? Abdominal ultrasound 10/08/2018-heterogenous right hepatic mass ? MRI abdomen 10/14/2018-2 enhancing right liver lesions, 4.8 x 4.5 x 4.1 cm, more superior lesion measured 3 x 2 x 2.5 cm, multiple additional subcentimeter T2 hyperintense lesions-too small to characterize. ? CT chest 11/04/2018-6 mm left upper lobe groundglass nodule, no evidence of metastatic disease in the chest ? Biopsy of [facility] liver lesion at [facility]  11/10/2018-moderately differentiated adenocarcinoma consistent with cholangiocarcinoma; IDH1 alteration; MSI stable; mismatch repair status proficient; tumor mutational burden low, 3; PD-L1 negative ? CTs at [facility] 12/12/2018-right hepatic lobe masses, satellite lesions adjacent to the dominant mass, prominent periportal and aortocaval lymph nodes prominent left supraclavicular node ? Cycle 1 gemcitabine[facility]cisplatin at [facility] on 12/12/2018 ? Cycle 2 gemcitabine/cisplatin 01/02/2019, 01/09/2019 ? Cycle 3 gemcitabine/cisplatin 01/23/2019 ? CTs at [facility] 02/02/2019-decrease in size of multiple hepatic masses, decreased size of prominent periportal and aortocaval nodes, unchanged prominent left supraclavicular node ? Cycle 4 gemcitabine/cisplatin 02/11/2019, Udenyca added and gemcitabine dose reduced with day 8 chemotherapy ? Cycle 5 gemcitabine/cisplatin 03/04/2019, gemcitabine reescalated to full dose ? Cycle 6 gemcitabine/cisplatin 2. Family history of multiple cancers eluding breast, pancreatic, melanoma, appendiceal cancer, lung cancer 3. Osteoporosis 4. Neutropenia secondary to chemotherapy, Udenyca added with day 8 cycle 4  Disposition: Ms. Sobiech appears stable.  She has tolerated the chemotherapy well.  She will begin cycle 6 gemcitabine/cisplatin today.  She will return for an office visit prior to day 1 cycle 7 on 04/15/2019.  [name], MD  03/25/2019  9:15 AM

## 2019-03-25 NOTE — Progress Notes (Signed)
Nutrition follow up completed with patient during infusion for Cholangiocarcinoma. She reports she is feeling better since our last visit. Weight is stable at 114.6 pounds. Noted Glucose of 104. Patient has been trying some new strategies for increasing her oral intake, some of which have been successful. She has not especially enjoyed the nutrition shakes.  Nutrition Diagnosis: Unintended weight loss is stable.  Intervention: Provided additional education on making smoothies or shakes using oral nutrition supplements. Provided support and encouragement.  Monitoring, Evaluation, Goals: Patient will tolerate adequate calories and protein for weight maintenance.  Next Visit: Thursday, March 4 during infusion.  **Disclaimer: This note was dictated with voice recognition software. Similar sounding words can inadvertently be transcribed and this note may contain transcription errors which may not have been corrected upon publication of note.**

## 2019-03-25 NOTE — Patient Instructions (Signed)

## 2019-03-26 ENCOUNTER — Encounter: Payer: Self-pay | Admitting: Oncology

## 2019-03-26 ENCOUNTER — Telehealth: Payer: Self-pay | Admitting: Oncology

## 2019-03-26 NOTE — Telephone Encounter (Signed)
Scheduled per los. Called and spoke with patient. Confirmed appts  

## 2019-03-29 ENCOUNTER — Other Ambulatory Visit: Payer: Self-pay | Admitting: Oncology

## 2019-04-01 ENCOUNTER — Inpatient Hospital Stay: Payer: No Typology Code available for payment source

## 2019-04-01 ENCOUNTER — Other Ambulatory Visit: Payer: Self-pay

## 2019-04-01 VITALS — BP 105/81 | HR 87 | Temp 98.7°F | Resp 18

## 2019-04-01 DIAGNOSIS — Z95828 Presence of other vascular implants and grafts: Secondary | ICD-10-CM

## 2019-04-01 DIAGNOSIS — Z5111 Encounter for antineoplastic chemotherapy: Secondary | ICD-10-CM | POA: Diagnosis not present

## 2019-04-01 DIAGNOSIS — C221 Intrahepatic bile duct carcinoma: Secondary | ICD-10-CM

## 2019-04-01 LAB — CBC WITH DIFFERENTIAL (CANCER CENTER ONLY)
Abs Immature Granulocytes: 0.05 10*3/uL (ref 0.00–0.07)
Basophils Absolute: 0 10*3/uL (ref 0.0–0.1)
Basophils Relative: 1 %
Eosinophils Absolute: 0 10*3/uL (ref 0.0–0.5)
Eosinophils Relative: 1 %
HCT: 32.1 % — ABNORMAL LOW (ref 36.0–46.0)
Hemoglobin: 10.6 g/dL — ABNORMAL LOW (ref 12.0–15.0)
Immature Granulocytes: 2 %
Lymphocytes Relative: 32 %
Lymphs Abs: 1.1 10*3/uL (ref 0.7–4.0)
MCH: 31 pg (ref 26.0–34.0)
MCHC: 33 g/dL (ref 30.0–36.0)
MCV: 93.9 fL (ref 80.0–100.0)
Monocytes Absolute: 0.2 10*3/uL (ref 0.1–1.0)
Monocytes Relative: 6 %
Neutro Abs: 2 10*3/uL (ref 1.7–7.7)
Neutrophils Relative %: 58 %
Platelet Count: 251 10*3/uL (ref 150–400)
RBC: 3.42 MIL/uL — ABNORMAL LOW (ref 3.87–5.11)
RDW: 15.3 % (ref 11.5–15.5)
WBC Count: 3.4 10*3/uL — ABNORMAL LOW (ref 4.0–10.5)
nRBC: 0 % (ref 0.0–0.2)

## 2019-04-01 LAB — CMP (CANCER CENTER ONLY)
ALT: 18 U/L (ref 0–44)
AST: 16 U/L (ref 15–41)
Albumin: 4.3 g/dL (ref 3.5–5.0)
Alkaline Phosphatase: 117 U/L (ref 38–126)
Anion gap: 7 (ref 5–15)
BUN: 15 mg/dL (ref 8–23)
CO2: 25 mmol/L (ref 22–32)
Calcium: 8.9 mg/dL (ref 8.9–10.3)
Chloride: 106 mmol/L (ref 98–111)
Creatinine: 0.65 mg/dL (ref 0.44–1.00)
GFR, Est AFR Am: >60 mL/min (ref >60)
GFR, Estimated: >60 mL/min (ref >60)
Glucose, Bld: 97 mg/dL (ref 70–99)
Potassium: 4.3 mmol/L (ref 3.5–5.1)
Sodium: 138 mmol/L (ref 135–145)
Total Bilirubin: 0.3 mg/dL (ref 0.3–1.2)
Total Protein: 6.5 g/dL (ref 6.5–8.1)

## 2019-04-01 LAB — MAGNESIUM: Magnesium: 1.8 mg/dL (ref 1.7–2.4)

## 2019-04-01 MED ORDER — SODIUM CHLORIDE 0.9 % IV SOLN
Freq: Once | INTRAVENOUS | Status: AC
  Filled 2019-04-01: qty 250

## 2019-04-01 MED ORDER — SODIUM CHLORIDE 0.9 % IV SOLN
Freq: Once | INTRAVENOUS | Status: AC
  Filled 2019-04-01: qty 5

## 2019-04-01 MED ORDER — SODIUM CHLORIDE 0.9% FLUSH
10.0000 mL | INTRAVENOUS | Status: DC | PRN
  Administered 2019-04-01: 10 mL
  Filled 2019-04-01: qty 10

## 2019-04-01 MED ORDER — SODIUM CHLORIDE 0.9 % IV SOLN
1000.0000 mg/m2 | Freq: Once | INTRAVENOUS | Status: AC
  Administered 2019-04-01: 1482 mg via INTRAVENOUS
  Filled 2019-04-01: qty 38.98

## 2019-04-01 MED ORDER — HEPARIN SOD (PORK) LOCK FLUSH 100 UNIT/ML IV SOLN
500.0000 [IU] | Freq: Once | INTRAVENOUS | Status: AC | PRN
  Administered 2019-04-01: 500 [IU]
  Filled 2019-04-01: qty 5

## 2019-04-01 MED ORDER — POTASSIUM CHLORIDE 2 MEQ/ML IV SOLN
Freq: Once | INTRAVENOUS | Status: AC
  Filled 2019-04-01: qty 10

## 2019-04-01 MED ORDER — SODIUM CHLORIDE 0.9 % IV SOLN
25.0000 mg/m2 | Freq: Once | INTRAVENOUS | Status: AC
  Administered 2019-04-01: 38 mg via INTRAVENOUS
  Filled 2019-04-01: qty 38

## 2019-04-01 MED ORDER — PALONOSETRON HCL INJECTION 0.25 MG/5ML
0.2500 mg | Freq: Once | INTRAVENOUS | Status: AC
  Administered 2019-04-01: 0.25 mg via INTRAVENOUS

## 2019-04-01 MED ORDER — PALONOSETRON HCL INJECTION 0.25 MG/5ML
INTRAVENOUS | Status: AC
  Filled 2019-04-01: qty 5

## 2019-04-01 NOTE — Patient Instructions (Signed)

## 2019-04-01 NOTE — Patient Instructions (Signed)
[facility] Discharge Instructions for Patients Receiving Chemotherapy  Today you received the following chemotherapy agents: gemcitabine and cisplatin.  To help prevent nausea and vomiting after your treatment, we encourage you to take your nausea medication as directed.   If you develop nausea and vomiting that is not controlled by your nausea medication, call the clinic.   BELOW ARE SYMPTOMS THAT SHOULD BE REPORTED IMMEDIATELY:  *FEVER GREATER THAN 100.5 F  *CHILLS WITH OR WITHOUT FEVER  NAUSEA AND VOMITING THAT IS NOT CONTROLLED WITH YOUR NAUSEA MEDICATION  *UNUSUAL SHORTNESS OF BREATH  *UNUSUAL BRUISING OR BLEEDING  TENDERNESS IN MOUTH AND THROAT WITH OR WITHOUT PRESENCE OF ULCERS  *URINARY PROBLEMS  *BOWEL PROBLEMS  UNUSUAL RASH Items with * indicate a potential emergency and should be followed up as soon as possible.  Feel free to call the clinic should you have any questions or concerns. The clinic phone number is [phone].  Please show the CHEMO ALERT CARD at check-in to the Emergency Department and triage nurse.   

## 2019-04-02 ENCOUNTER — Other Ambulatory Visit: Payer: Self-pay

## 2019-04-02 ENCOUNTER — Inpatient Hospital Stay: Payer: No Typology Code available for payment source

## 2019-04-02 ENCOUNTER — Ambulatory Visit: Payer: 59

## 2019-04-02 VITALS — BP 118/78 | HR 78 | Temp 98.2°F | Resp 18

## 2019-04-02 DIAGNOSIS — Z5111 Encounter for antineoplastic chemotherapy: Secondary | ICD-10-CM | POA: Diagnosis not present

## 2019-04-02 DIAGNOSIS — C221 Intrahepatic bile duct carcinoma: Secondary | ICD-10-CM

## 2019-04-02 MED ORDER — PEGFILGRASTIM-CBQV 6 MG/0.6ML ~~LOC~~ SOSY
PREFILLED_SYRINGE | SUBCUTANEOUS | Status: AC
  Filled 2019-04-02: qty 0.6

## 2019-04-02 MED ORDER — PEGFILGRASTIM-CBQV 6 MG/0.6ML ~~LOC~~ SOSY
6.0000 mg | PREFILLED_SYRINGE | Freq: Once | SUBCUTANEOUS | Status: AC
  Administered 2019-04-02: 6 mg via SUBCUTANEOUS

## 2019-04-02 NOTE — Patient Instructions (Signed)

## 2019-04-11 ENCOUNTER — Other Ambulatory Visit: Payer: Self-pay | Admitting: Oncology

## 2019-04-14 ENCOUNTER — Encounter: Payer: Self-pay | Admitting: Oncology

## 2019-04-15 ENCOUNTER — Inpatient Hospital Stay: Payer: No Typology Code available for payment source | Admitting: Nutrition

## 2019-04-15 ENCOUNTER — Inpatient Hospital Stay: Payer: No Typology Code available for payment source

## 2019-04-15 ENCOUNTER — Other Ambulatory Visit: Payer: Self-pay

## 2019-04-15 ENCOUNTER — Inpatient Hospital Stay (HOSPITAL_BASED_OUTPATIENT_CLINIC_OR_DEPARTMENT_OTHER): Payer: No Typology Code available for payment source | Admitting: Oncology

## 2019-04-15 ENCOUNTER — Telehealth: Payer: Self-pay | Admitting: Oncology

## 2019-04-15 ENCOUNTER — Inpatient Hospital Stay: Payer: No Typology Code available for payment source | Attending: Oncology

## 2019-04-15 ENCOUNTER — Ambulatory Visit: Payer: 59

## 2019-04-15 ENCOUNTER — Ambulatory Visit: Payer: 59 | Admitting: Oncology

## 2019-04-15 VITALS — BP 125/68 | HR 89 | Temp 97.2°F | Resp 18 | Ht 64.0 in | Wt 114.7 lb

## 2019-04-15 DIAGNOSIS — Z803 Family history of malignant neoplasm of breast: Secondary | ICD-10-CM | POA: Insufficient documentation

## 2019-04-15 DIAGNOSIS — Z5111 Encounter for antineoplastic chemotherapy: Secondary | ICD-10-CM | POA: Diagnosis not present

## 2019-04-15 DIAGNOSIS — C221 Intrahepatic bile duct carcinoma: Secondary | ICD-10-CM

## 2019-04-15 DIAGNOSIS — D701 Agranulocytosis secondary to cancer chemotherapy: Secondary | ICD-10-CM | POA: Diagnosis not present

## 2019-04-15 DIAGNOSIS — Z95828 Presence of other vascular implants and grafts: Secondary | ICD-10-CM

## 2019-04-15 DIAGNOSIS — Z801 Family history of malignant neoplasm of trachea, bronchus and lung: Secondary | ICD-10-CM | POA: Insufficient documentation

## 2019-04-15 DIAGNOSIS — Z5189 Encounter for other specified aftercare: Secondary | ICD-10-CM | POA: Insufficient documentation

## 2019-04-15 DIAGNOSIS — Z808 Family history of malignant neoplasm of other organs or systems: Secondary | ICD-10-CM | POA: Insufficient documentation

## 2019-04-15 DIAGNOSIS — Z800 Family history of malignant neoplasm of digestive organs: Secondary | ICD-10-CM | POA: Diagnosis not present

## 2019-04-15 DIAGNOSIS — T451X5A Adverse effect of antineoplastic and immunosuppressive drugs, initial encounter: Secondary | ICD-10-CM | POA: Diagnosis not present

## 2019-04-15 DIAGNOSIS — R21 Rash and other nonspecific skin eruption: Secondary | ICD-10-CM | POA: Diagnosis not present

## 2019-04-15 LAB — CBC WITH DIFFERENTIAL (CANCER CENTER ONLY)
Abs Immature Granulocytes: 0.05 10*3/uL (ref 0.00–0.07)
Basophils Absolute: 0.1 10*3/uL (ref 0.0–0.1)
Basophils Relative: 1 %
Eosinophils Absolute: 0 10*3/uL (ref 0.0–0.5)
Eosinophils Relative: 0 %
HCT: 33.5 % — ABNORMAL LOW (ref 36.0–46.0)
Hemoglobin: 11 g/dL — ABNORMAL LOW (ref 12.0–15.0)
Immature Granulocytes: 1 %
Lymphocytes Relative: 19 %
Lymphs Abs: 1 10*3/uL (ref 0.7–4.0)
MCH: 31.6 pg (ref 26.0–34.0)
MCHC: 32.8 g/dL (ref 30.0–36.0)
MCV: 96.3 fL (ref 80.0–100.0)
Monocytes Absolute: 0.4 10*3/uL (ref 0.1–1.0)
Monocytes Relative: 7 %
Neutro Abs: 3.7 10*3/uL (ref 1.7–7.7)
Neutrophils Relative %: 72 %
Platelet Count: 283 10*3/uL (ref 150–400)
RBC: 3.48 MIL/uL — ABNORMAL LOW (ref 3.87–5.11)
RDW: 15.5 % (ref 11.5–15.5)
WBC Count: 5.2 10*3/uL (ref 4.0–10.5)
nRBC: 0 % (ref 0.0–0.2)

## 2019-04-15 LAB — CMP (CANCER CENTER ONLY)
ALT: 14 U/L (ref 0–44)
AST: 16 U/L (ref 15–41)
Albumin: 4.2 g/dL (ref 3.5–5.0)
Alkaline Phosphatase: 132 U/L — ABNORMAL HIGH (ref 38–126)
Anion gap: 8 (ref 5–15)
BUN: 13 mg/dL (ref 8–23)
CO2: 24 mmol/L (ref 22–32)
Calcium: 8.7 mg/dL — ABNORMAL LOW (ref 8.9–10.3)
Chloride: 108 mmol/L (ref 98–111)
Creatinine: 0.66 mg/dL (ref 0.44–1.00)
GFR, Est AFR Am: >60 mL/min (ref >60)
GFR, Estimated: >60 mL/min (ref >60)
Glucose, Bld: 103 mg/dL — ABNORMAL HIGH (ref 70–99)
Potassium: 4.1 mmol/L (ref 3.5–5.1)
Sodium: 140 mmol/L (ref 135–145)
Total Bilirubin: 0.3 mg/dL (ref 0.3–1.2)
Total Protein: 6.2 g/dL — ABNORMAL LOW (ref 6.5–8.1)

## 2019-04-15 LAB — MAGNESIUM: Magnesium: 1.7 mg/dL (ref 1.7–2.4)

## 2019-04-15 MED ORDER — POTASSIUM CHLORIDE 2 MEQ/ML IV SOLN
Freq: Once | INTRAVENOUS | Status: AC
  Filled 2019-04-15: qty 10

## 2019-04-15 MED ORDER — SODIUM CHLORIDE 0.9% FLUSH
10.0000 mL | INTRAVENOUS | Status: DC | PRN
  Administered 2019-04-15: 10 mL
  Filled 2019-04-15: qty 10

## 2019-04-15 MED ORDER — SODIUM CHLORIDE 0.9 % IV SOLN
Freq: Once | INTRAVENOUS | Status: DC
  Filled 2019-04-15: qty 5

## 2019-04-15 MED ORDER — PALONOSETRON HCL INJECTION 0.25 MG/5ML
0.2500 mg | Freq: Once | INTRAVENOUS | Status: AC
  Administered 2019-04-15: 0.25 mg via INTRAVENOUS

## 2019-04-15 MED ORDER — SODIUM CHLORIDE 0.9 % IV SOLN
1000.0000 mg/m2 | Freq: Once | INTRAVENOUS | Status: AC
  Administered 2019-04-15: 1482 mg via INTRAVENOUS
  Filled 2019-04-15: qty 38.98

## 2019-04-15 MED ORDER — SODIUM CHLORIDE 0.9 % IV SOLN
Freq: Once | INTRAVENOUS | Status: AC
  Filled 2019-04-15: qty 5

## 2019-04-15 MED ORDER — HEPARIN SOD (PORK) LOCK FLUSH 100 UNIT/ML IV SOLN
500.0000 [IU] | Freq: Once | INTRAVENOUS | Status: AC | PRN
  Administered 2019-04-15: 500 [IU]
  Filled 2019-04-15: qty 5

## 2019-04-15 MED ORDER — PALONOSETRON HCL INJECTION 0.25 MG/5ML
INTRAVENOUS | Status: AC
  Filled 2019-04-15: qty 5

## 2019-04-15 MED ORDER — SODIUM CHLORIDE 0.9 % IV SOLN
25.0000 mg/m2 | Freq: Once | INTRAVENOUS | Status: AC
  Administered 2019-04-15: 38 mg via INTRAVENOUS
  Filled 2019-04-15: qty 38

## 2019-04-15 MED ORDER — SODIUM CHLORIDE 0.9 % IV SOLN
Freq: Once | INTRAVENOUS | Status: AC
  Filled 2019-04-15: qty 250

## 2019-04-15 NOTE — Patient Instructions (Signed)
Please provide copy of your medical Advanced Directive at your next visit to have scanned into your record.

## 2019-04-15 NOTE — Patient Instructions (Signed)
[facility] Discharge Instructions for Patients Receiving Chemotherapy  Today you received the following chemotherapy agents: gemcitabine and cisplatin.  To help prevent nausea and vomiting after your treatment, we encourage you to take your nausea medication as directed.   If you develop nausea and vomiting that is not controlled by your nausea medication, call the clinic.   BELOW ARE SYMPTOMS THAT SHOULD BE REPORTED IMMEDIATELY:  *FEVER GREATER THAN 100.5 F  *CHILLS WITH OR WITHOUT FEVER  NAUSEA AND VOMITING THAT IS NOT CONTROLLED WITH YOUR NAUSEA MEDICATION  *UNUSUAL SHORTNESS OF BREATH  *UNUSUAL BRUISING OR BLEEDING  TENDERNESS IN MOUTH AND THROAT WITH OR WITHOUT PRESENCE OF ULCERS  *URINARY PROBLEMS  *BOWEL PROBLEMS  UNUSUAL RASH Items with * indicate a potential emergency and should be followed up as soon as possible.  Feel free to call the clinic should you have any questions or concerns. The clinic phone number is [phone].  Please show the CHEMO ALERT CARD at check-in to the Emergency Department and triage nurse.   

## 2019-04-15 NOTE — Progress Notes (Signed)
  [place] OFFICE PROGRESS NOTE   Diagnosis: Cholangiocarcinoma  INTERVAL HISTORY:   Ms. Balles completed another cycle of gemcitabine/cisplatin beginning 03/25/2019.  No nausea, fever, or neuropathy symptoms.  She has a mild rash over the face.  Good appetite.  No abdominal pain.  Objective:  Vital signs in last 24 hours:  Blood pressure 125/68, pulse 89, temperature (!) 97.2 F (36.2 C), temperature source Temporal, resp. rate 18, height '5\' 4"'$  (1.626 m), weight 114 lb 11.2 oz (52 kg), SpO2 100 %.      GI: No mass, no hepatomegaly, nontender Vascular: No leg edema  Skin: Mild acne type rash at the malar areas bilaterally  Portacath/PICC-without erythema  Lab Results:  Lab Results  Component Value Date   WBC 5.2 04/15/2019   HGB 11.0 (L) 04/15/2019   HCT 33.5 (L) 04/15/2019   MCV 96.3 04/15/2019   PLT 283 04/15/2019   NEUTROABS 3.7 04/15/2019    CMP  Lab Results  Component Value Date   NA 140 04/15/2019   K 4.1 04/15/2019   CL 108 04/15/2019   CO2 24 04/15/2019   GLUCOSE 103 (H) 04/15/2019   BUN 13 04/15/2019   CREATININE 0.66 04/15/2019   CALCIUM 8.7 (L) 04/15/2019   PROT 6.2 (L) 04/15/2019   ALBUMIN 4.2 04/15/2019   AST 16 04/15/2019   ALT 14 04/15/2019   ALKPHOS 132 (H) 04/15/2019   BILITOT 0.3 04/15/2019   GFRNONAA >60 04/15/2019   GFRAA >60 04/15/2019     Medications: I have reviewed the patient's current medications.   Assessment/Plan: 1. Intrahepatic cholangiocarcinoma ? Abdominal ultrasound 10/08/2018-heterogenous right hepatic mass ? MRI abdomen 10/14/2018-2 enhancing right liver lesions, 4.8 x 4.5 x 4.1 cm, more superior lesion measured 3 x 2 x 2.5 cm, multiple additional subcentimeter T2 hyperintense lesions-too small to characterize. ? CT chest 11/04/2018-6 mm left upper lobe groundglass nodule, no evidence of metastatic disease in the chest ? Biopsy of right liver lesion at [facility] 11/10/2018-moderately differentiated  adenocarcinoma consistent with cholangiocarcinoma; IDH1 alteration; MSI stable; mismatch repair status proficient; tumor mutational burden low, 3; PD-L1 negative ? CTs at [facility] 12/12/2018-right hepatic lobe masses, satellite lesions adjacent to the dominant mass, prominent periportal and aortocaval lymph nodes prominent left supraclavicular node ? Cycle 1 gemcitabine/cisplatin at [facility] on 12/12/2018 ? Cycle 2 gemcitabine/cisplatin 01/02/2019, 01/09/2019 ? Cycle 3 gemcitabine/cisplatin 01/23/2019 ? CTs at [facility] 02/02/2019-decrease in size of multiple hepatic masses, decreased size of prominent periportal and aortocaval nodes, unchanged prominent left supraclavicular node ? Cycle 4 gemcitabine/cisplatin 02/11/2019, Udenyca added and gemcitabine dose reduced with day 8 chemotherapy ? Cycle 5 gemcitabine/cisplatin 03/04/2019, gemcitabine reescalated to full dose ? Cycle 6 gemcitabine/cisplatin 03/25/2019 ? Cycle 7 gemcitabine/cisplatin 04/15/2019 2. Family history of multiple cancers eluding breast, pancreatic, melanoma, appendiceal cancer, lung cancer 3. Osteoporosis 4. Neutropenia secondary to chemotherapy, Udenyca added with day 8 cycle 4    Disposition:  Ms. Meadow appears stable.  She will complete another cycle of gemcitabine/cisplatin beginning today.  She will return for an office visit in the next cycle of chemotherapy in 3 weeks.  She is scheduled for restaging CTs at [facility] in early April.  The face rash is most likely related to Decadron.  We will consider decreasing the Decadron and prescribing topical therapy if the rash progresses.  [name], MD  04/15/2019  9:08 AM

## 2019-04-15 NOTE — Progress Notes (Signed)
Brief nutrition follow-up completed with patient during infusion for cholangiocarcinoma. Weight is stable and was documented as 114.7 pounds on March 3.  Patient reports she feels well and has no nutrition questions or concerns.  Nutrition diagnosis: Unintended weight loss resolved.  Encourage patient to contact me if she developes any questions or concerns.  She has my contact information.  **Disclaimer: This note was dictated with voice recognition software. Similar sounding words can inadvertently be transcribed and this note may contain transcription errors which may not have been corrected upon publication of note.**

## 2019-04-15 NOTE — Telephone Encounter (Signed)
Scheduled appts per 3/3 los. Pt stated she would see the visits show up on my cart.

## 2019-04-16 ENCOUNTER — Encounter: Payer: 59 | Admitting: Nutrition

## 2019-04-16 ENCOUNTER — Ambulatory Visit: Payer: 59 | Admitting: Oncology

## 2019-04-16 ENCOUNTER — Other Ambulatory Visit: Payer: 59

## 2019-04-16 ENCOUNTER — Ambulatory Visit: Payer: 59

## 2019-04-19 ENCOUNTER — Other Ambulatory Visit: Payer: Self-pay | Admitting: Oncology

## 2019-04-22 ENCOUNTER — Ambulatory Visit: Payer: 59

## 2019-04-22 ENCOUNTER — Other Ambulatory Visit: Payer: 59

## 2019-04-22 ENCOUNTER — Telehealth: Payer: Self-pay | Admitting: Oncology

## 2019-04-22 NOTE — Telephone Encounter (Signed)
vmail from 3/9 -pt request to adjust some appts. Called pt back, no answer . Left message for pt to call back

## 2019-04-23 ENCOUNTER — Ambulatory Visit: Payer: 59

## 2019-04-23 ENCOUNTER — Other Ambulatory Visit: Payer: Self-pay

## 2019-04-23 ENCOUNTER — Inpatient Hospital Stay: Payer: No Typology Code available for payment source

## 2019-04-23 VITALS — BP 121/68 | HR 90 | Temp 98.7°F | Resp 18

## 2019-04-23 DIAGNOSIS — C221 Intrahepatic bile duct carcinoma: Secondary | ICD-10-CM

## 2019-04-23 DIAGNOSIS — Z95828 Presence of other vascular implants and grafts: Secondary | ICD-10-CM

## 2019-04-23 DIAGNOSIS — Z5111 Encounter for antineoplastic chemotherapy: Secondary | ICD-10-CM | POA: Diagnosis not present

## 2019-04-23 LAB — CBC WITH DIFFERENTIAL (CANCER CENTER ONLY)
Abs Immature Granulocytes: 0.07 10*3/uL (ref 0.00–0.07)
Basophils Absolute: 0.1 10*3/uL (ref 0.0–0.1)
Basophils Relative: 1 %
Eosinophils Absolute: 0 10*3/uL (ref 0.0–0.5)
Eosinophils Relative: 1 %
HCT: 32.1 % — ABNORMAL LOW (ref 36.0–46.0)
Hemoglobin: 10.5 g/dL — ABNORMAL LOW (ref 12.0–15.0)
Immature Granulocytes: 1 %
Lymphocytes Relative: 25 %
Lymphs Abs: 1.3 10*3/uL (ref 0.7–4.0)
MCH: 31.1 pg (ref 26.0–34.0)
MCHC: 32.7 g/dL (ref 30.0–36.0)
MCV: 95 fL (ref 80.0–100.0)
Monocytes Absolute: 0.5 10*3/uL (ref 0.1–1.0)
Monocytes Relative: 9 %
Neutro Abs: 3.4 10*3/uL (ref 1.7–7.7)
Neutrophils Relative %: 63 %
Platelet Count: 219 10*3/uL (ref 150–400)
RBC: 3.38 MIL/uL — ABNORMAL LOW (ref 3.87–5.11)
RDW: 14.5 % (ref 11.5–15.5)
WBC Count: 5.3 10*3/uL (ref 4.0–10.5)
nRBC: 0 % (ref 0.0–0.2)

## 2019-04-23 LAB — CMP (CANCER CENTER ONLY)
ALT: 21 U/L (ref 0–44)
AST: 18 U/L (ref 15–41)
Albumin: 4.1 g/dL (ref 3.5–5.0)
Alkaline Phosphatase: 119 U/L (ref 38–126)
Anion gap: 7 (ref 5–15)
BUN: 12 mg/dL (ref 8–23)
CO2: 24 mmol/L (ref 22–32)
Calcium: 8.8 mg/dL — ABNORMAL LOW (ref 8.9–10.3)
Chloride: 108 mmol/L (ref 98–111)
Creatinine: 0.66 mg/dL (ref 0.44–1.00)
GFR, Est AFR Am: >60 mL/min (ref >60)
GFR, Estimated: >60 mL/min (ref >60)
Glucose, Bld: 101 mg/dL — ABNORMAL HIGH (ref 70–99)
Potassium: 4.3 mmol/L (ref 3.5–5.1)
Sodium: 139 mmol/L (ref 135–145)
Total Bilirubin: 0.3 mg/dL (ref 0.3–1.2)
Total Protein: 6.1 g/dL — ABNORMAL LOW (ref 6.5–8.1)

## 2019-04-23 LAB — MAGNESIUM: Magnesium: 1.7 mg/dL (ref 1.7–2.4)

## 2019-04-23 MED ORDER — POTASSIUM CHLORIDE 2 MEQ/ML IV SOLN
Freq: Once | INTRAVENOUS | Status: AC
  Filled 2019-04-23: qty 10

## 2019-04-23 MED ORDER — HEPARIN SOD (PORK) LOCK FLUSH 100 UNIT/ML IV SOLN
500.0000 [IU] | Freq: Once | INTRAVENOUS | Status: AC | PRN
  Administered 2019-04-23: 500 [IU]
  Filled 2019-04-23: qty 5

## 2019-04-23 MED ORDER — SODIUM CHLORIDE 0.9 % IV SOLN
25.0000 mg/m2 | Freq: Once | INTRAVENOUS | Status: AC
  Administered 2019-04-23: 38 mg via INTRAVENOUS
  Filled 2019-04-23: qty 38

## 2019-04-23 MED ORDER — SODIUM CHLORIDE 0.9% FLUSH
10.0000 mL | INTRAVENOUS | Status: DC | PRN
  Administered 2019-04-23: 10 mL
  Filled 2019-04-23: qty 10

## 2019-04-23 MED ORDER — SODIUM CHLORIDE 0.9 % IV SOLN
1000.0000 mg/m2 | Freq: Once | INTRAVENOUS | Status: AC
  Administered 2019-04-23: 1482 mg via INTRAVENOUS
  Filled 2019-04-23: qty 38.98

## 2019-04-23 MED ORDER — SODIUM CHLORIDE 0.9 % IV SOLN
Freq: Once | INTRAVENOUS | Status: AC
  Filled 2019-04-23: qty 5

## 2019-04-23 MED ORDER — SODIUM CHLORIDE 0.9 % IV SOLN
Freq: Once | INTRAVENOUS | Status: AC
  Filled 2019-04-23: qty 250

## 2019-04-23 MED ORDER — PALONOSETRON HCL INJECTION 0.25 MG/5ML
INTRAVENOUS | Status: AC
  Filled 2019-04-23: qty 5

## 2019-04-23 MED ORDER — PALONOSETRON HCL INJECTION 0.25 MG/5ML
0.2500 mg | Freq: Once | INTRAVENOUS | Status: AC
  Administered 2019-04-23: 0.25 mg via INTRAVENOUS

## 2019-04-23 NOTE — Patient Instructions (Signed)
[facility] Discharge Instructions for Patients Receiving Chemotherapy  Today you received the following chemotherapy agents: gemcitabine and cisplatin.  To help prevent nausea and vomiting after your treatment, we encourage you to take your nausea medication as directed.   If you develop nausea and vomiting that is not controlled by your nausea medication, call the clinic.   BELOW ARE SYMPTOMS THAT SHOULD BE REPORTED IMMEDIATELY:  *FEVER GREATER THAN 100.5 F  *CHILLS WITH OR WITHOUT FEVER  NAUSEA AND VOMITING THAT IS NOT CONTROLLED WITH YOUR NAUSEA MEDICATION  *UNUSUAL SHORTNESS OF BREATH  *UNUSUAL BRUISING OR BLEEDING  TENDERNESS IN MOUTH AND THROAT WITH OR WITHOUT PRESENCE OF ULCERS  *URINARY PROBLEMS  *BOWEL PROBLEMS  UNUSUAL RASH Items with * indicate a potential emergency and should be followed up as soon as possible.  Feel free to call the clinic should you have any questions or concerns. The clinic phone number is [phone].  Please show the CHEMO ALERT CARD at check-in to the Emergency Department and triage nurse.   

## 2019-04-24 ENCOUNTER — Other Ambulatory Visit: Payer: Self-pay

## 2019-04-24 ENCOUNTER — Inpatient Hospital Stay: Payer: No Typology Code available for payment source

## 2019-04-24 VITALS — BP 123/70 | HR 87 | Temp 98.3°F | Resp 18

## 2019-04-24 DIAGNOSIS — Z5111 Encounter for antineoplastic chemotherapy: Secondary | ICD-10-CM | POA: Diagnosis not present

## 2019-04-24 DIAGNOSIS — C221 Intrahepatic bile duct carcinoma: Secondary | ICD-10-CM

## 2019-04-24 MED ORDER — PEGFILGRASTIM-CBQV 6 MG/0.6ML ~~LOC~~ SOSY
PREFILLED_SYRINGE | SUBCUTANEOUS | Status: AC
  Filled 2019-04-24: qty 0.6

## 2019-04-24 MED ORDER — PEGFILGRASTIM-CBQV 6 MG/0.6ML ~~LOC~~ SOSY
6.0000 mg | PREFILLED_SYRINGE | Freq: Once | SUBCUTANEOUS | Status: AC
  Administered 2019-04-24: 6 mg via SUBCUTANEOUS

## 2019-04-24 NOTE — Patient Instructions (Signed)

## 2019-04-30 ENCOUNTER — Ambulatory Visit: Payer: 59

## 2019-05-03 ENCOUNTER — Other Ambulatory Visit: Payer: Self-pay | Admitting: Oncology

## 2019-05-06 ENCOUNTER — Inpatient Hospital Stay: Payer: No Typology Code available for payment source

## 2019-05-06 ENCOUNTER — Other Ambulatory Visit: Payer: Self-pay | Admitting: *Deleted

## 2019-05-06 ENCOUNTER — Inpatient Hospital Stay (HOSPITAL_BASED_OUTPATIENT_CLINIC_OR_DEPARTMENT_OTHER): Payer: No Typology Code available for payment source | Admitting: Oncology

## 2019-05-06 ENCOUNTER — Other Ambulatory Visit: Payer: Self-pay

## 2019-05-06 VITALS — BP 128/71 | HR 97 | Resp 20

## 2019-05-06 VITALS — BP 118/69 | HR 79 | Temp 97.8°F | Resp 18 | Ht 64.0 in | Wt 114.3 lb

## 2019-05-06 DIAGNOSIS — C221 Intrahepatic bile duct carcinoma: Secondary | ICD-10-CM

## 2019-05-06 DIAGNOSIS — Z5111 Encounter for antineoplastic chemotherapy: Secondary | ICD-10-CM | POA: Diagnosis not present

## 2019-05-06 LAB — CBC WITH DIFFERENTIAL (CANCER CENTER ONLY)
Abs Immature Granulocytes: 0.06 10*3/uL (ref 0.00–0.07)
Basophils Absolute: 0 10*3/uL (ref 0.0–0.1)
Basophils Relative: 1 %
Eosinophils Absolute: 0 10*3/uL (ref 0.0–0.5)
Eosinophils Relative: 0 %
HCT: 34.3 % — ABNORMAL LOW (ref 36.0–46.0)
Hemoglobin: 11 g/dL — ABNORMAL LOW (ref 12.0–15.0)
Immature Granulocytes: 1 %
Lymphocytes Relative: 22 %
Lymphs Abs: 1.2 10*3/uL (ref 0.7–4.0)
MCH: 31.2 pg (ref 26.0–34.0)
MCHC: 32.1 g/dL (ref 30.0–36.0)
MCV: 97.2 fL (ref 80.0–100.0)
Monocytes Absolute: 0.4 10*3/uL (ref 0.1–1.0)
Monocytes Relative: 8 %
Neutro Abs: 3.9 10*3/uL (ref 1.7–7.7)
Neutrophils Relative %: 68 %
Platelet Count: 229 10*3/uL (ref 150–400)
RBC: 3.53 MIL/uL — ABNORMAL LOW (ref 3.87–5.11)
RDW: 14.3 % (ref 11.5–15.5)
WBC Count: 5.6 10*3/uL (ref 4.0–10.5)
nRBC: 0 % (ref 0.0–0.2)

## 2019-05-06 LAB — CMP (CANCER CENTER ONLY)
ALT: 11 U/L (ref 0–44)
AST: 15 U/L (ref 15–41)
Albumin: 4.2 g/dL (ref 3.5–5.0)
Alkaline Phosphatase: 146 U/L — ABNORMAL HIGH (ref 38–126)
Anion gap: 8 (ref 5–15)
BUN: 15 mg/dL (ref 8–23)
CO2: 25 mmol/L (ref 22–32)
Calcium: 9 mg/dL (ref 8.9–10.3)
Chloride: 108 mmol/L (ref 98–111)
Creatinine: 0.66 mg/dL (ref 0.44–1.00)
GFR, Est AFR Am: >60 mL/min (ref >60)
GFR, Estimated: >60 mL/min (ref >60)
Glucose, Bld: 100 mg/dL — ABNORMAL HIGH (ref 70–99)
Potassium: 4.2 mmol/L (ref 3.5–5.1)
Sodium: 141 mmol/L (ref 135–145)
Total Bilirubin: 0.3 mg/dL (ref 0.3–1.2)
Total Protein: 6.3 g/dL — ABNORMAL LOW (ref 6.5–8.1)

## 2019-05-06 LAB — MAGNESIUM: Magnesium: 1.8 mg/dL (ref 1.7–2.4)

## 2019-05-06 MED ORDER — SODIUM CHLORIDE 0.9 % IV SOLN
Freq: Once | INTRAVENOUS | Status: AC
  Filled 2019-05-06: qty 250

## 2019-05-06 MED ORDER — SODIUM CHLORIDE 0.9 % IV SOLN
25.0000 mg/m2 | Freq: Once | INTRAVENOUS | Status: AC
  Administered 2019-05-06: 38 mg via INTRAVENOUS
  Filled 2019-05-06: qty 38

## 2019-05-06 MED ORDER — PALONOSETRON HCL INJECTION 0.25 MG/5ML
INTRAVENOUS | Status: AC
  Filled 2019-05-06: qty 5

## 2019-05-06 MED ORDER — PALONOSETRON HCL INJECTION 0.25 MG/5ML
0.2500 mg | Freq: Once | INTRAVENOUS | Status: AC
  Administered 2019-05-06: 0.25 mg via INTRAVENOUS

## 2019-05-06 MED ORDER — POTASSIUM CHLORIDE 2 MEQ/ML IV SOLN
Freq: Once | INTRAVENOUS | Status: AC
  Filled 2019-05-06: qty 10

## 2019-05-06 MED ORDER — SODIUM CHLORIDE 0.9 % IV SOLN
1000.0000 mg/m2 | Freq: Once | INTRAVENOUS | Status: AC
  Administered 2019-05-06: 1482 mg via INTRAVENOUS
  Filled 2019-05-06: qty 38.98

## 2019-05-06 MED ORDER — HEPARIN SOD (PORK) LOCK FLUSH 100 UNIT/ML IV SOLN
500.0000 [IU] | Freq: Once | INTRAVENOUS | Status: AC | PRN
  Administered 2019-05-06: 500 [IU]
  Filled 2019-05-06: qty 5

## 2019-05-06 MED ORDER — DEXAMETHASONE 4 MG PO TABS: 8.0000 mg | ORAL_TABLET | Freq: Every day | ORAL | 3 refills | Status: DC

## 2019-05-06 MED ORDER — SODIUM CHLORIDE 0.9% FLUSH
10.0000 mL | INTRAVENOUS | Status: DC | PRN
  Administered 2019-05-06: 10 mL
  Filled 2019-05-06: qty 10

## 2019-05-06 MED ORDER — SODIUM CHLORIDE 0.9 % IV SOLN
Freq: Once | INTRAVENOUS | Status: AC
  Filled 2019-05-06: qty 5

## 2019-05-06 NOTE — Patient Instructions (Signed)
[facility] Discharge Instructions for Patients Receiving Chemotherapy  Today you received the following chemotherapy agents Gemcitabine (GEMZAR) & Cisplatin (PLATINOL).  To help prevent nausea and vomiting after your treatment, we encourage you to take your nausea medication as prescribed.   If you develop nausea and vomiting that is not controlled by your nausea medication, call the clinic.   BELOW ARE SYMPTOMS THAT SHOULD BE REPORTED IMMEDIATELY:  *FEVER GREATER THAN 100.5 F  *CHILLS WITH OR WITHOUT FEVER  NAUSEA AND VOMITING THAT IS NOT CONTROLLED WITH YOUR NAUSEA MEDICATION  *UNUSUAL SHORTNESS OF BREATH  *UNUSUAL BRUISING OR BLEEDING  TENDERNESS IN MOUTH AND THROAT WITH OR WITHOUT PRESENCE OF ULCERS  *URINARY PROBLEMS  *BOWEL PROBLEMS  UNUSUAL RASH Items with * indicate a potential emergency and should be followed up as soon as possible.  Feel free to call the clinic should you have any questions or concerns. The clinic phone number is [phone].  Please show the CHEMO ALERT CARD at check-in to the Emergency Department and triage nurse.  Coronavirus (COVID-19) Are you at risk?  Are you at risk for the Coronavirus (COVID-19)?  To be considered HIGH RISK for Coronavirus (COVID-19), you have to meet the following criteria:  . Traveled to [place], [place], [place], [place] or [place]; or in the [place] to [place], [place], [place], or [place]; and have fever, cough, and shortness of breath within the last 2 weeks of travel OR . Been in close contact with a person diagnosed with COVID-19 within the last 2 weeks and have fever, cough, and shortness of breath . IF YOU DO NOT MEET THESE CRITERIA, YOU ARE CONSIDERED LOW RISK FOR COVID-19.  What to do if you are HIGH RISK for COVID-19?  . If you are having a medical emergency, call 911. . Seek medical care right away. Before you go to a doctor's office, urgent care or emergency department, call  ahead and tell them about your recent travel, contact with someone diagnosed with COVID-19, and your symptoms. You should receive instructions from your physician's office regarding next steps of care.  . When you arrive at healthcare provider, tell the healthcare staff immediately you have returned from visiting [place], [place], [place], [place] or [place]; or traveled in the [place] to [place], [place], [place], or [place]; in the last two weeks or you have been in close contact with a person diagnosed with COVID-19 in the last 2 weeks.   . Tell the health care staff about your symptoms: fever, cough and shortness of breath. . After you have been seen by a medical provider, you will be either: o Tested for (COVID-19) and discharged home on quarantine except to seek medical care if symptoms worsen, and asked to  - Stay home and avoid contact with others until you get your results (4-5 days)  - Avoid travel on public transportation if possible (such as bus, train, or airplane) or o Sent to the Emergency Department by EMS for evaluation, COVID-19 testing, and possible admission depending on your condition and test results.  What to do if you are LOW RISK for COVID-19?  Reduce your risk of any infection by using the same precautions used for avoiding the common cold or flu:  . Wash your hands often with soap and warm water for at least 20 seconds.  If soap and water are not readily available, use an alcohol-based hand sanitizer with at least 60% alcohol.  .   If coughing or sneezing, cover your mouth and nose by coughing or sneezing into the elbow areas of your shirt or coat, into a tissue or into your sleeve (not your hands). . Avoid shaking hands with others and consider head nods or verbal greetings only. . Avoid touching your eyes, nose, or mouth with unwashed hands.  . Avoid close contact with people who are sick. . Avoid places or events with large numbers of people in one location,  like concerts or sporting events. . Carefully consider travel plans you have or are making. . If you are planning any travel outside or inside the [place], visit the CDC's Travelers' Health webpage for the latest health notices. . If you have some symptoms but not all symptoms, continue to monitor at home and seek medical attention if your symptoms worsen. . If you are having a medical emergency, call 911.   ADDITIONAL HEALTHCARE OPTIONS FOR PATIENTS  [facility] / e-Visit: [email or web address]         [facility]: [phone]  [facility]: [phone]                   [facility]: [phone]    

## 2019-05-06 NOTE — Progress Notes (Signed)
  [place] OFFICE PROGRESS NOTE   Diagnosis: Cholangiocarcinoma  INTERVAL HISTORY:   Angela Fleming returns as scheduled.  She feels well.  No nausea/vomiting or neuropathy symptoms.  No recurrent visual symptoms.  No abdominal pain.  She completed day 8 of the last chemotherapy cycle on 04/23/2019.  Objective:  Vital signs in last 24 hours:  Blood pressure 118/69, pulse 79, temperature 97.8 F (36.6 C), temperature source Temporal, resp. rate 18, height '5\' 4"'$  (1.626 m), weight 114 lb 4.8 oz (51.8 kg), SpO2 100 %.     GI: No hepatosplenomegaly, no mass, nontender Vascular: No leg edema  Skin: Palms without erythema  Portacath/PICC-without erythema  Lab Results:  Lab Results  Component Value Date   WBC 5.6 05/06/2019   HGB 11.0 (L) 05/06/2019   HCT 34.3 (L) 05/06/2019   MCV 97.2 05/06/2019   PLT 229 05/06/2019   NEUTROABS 3.9 05/06/2019    CMP  Lab Results  Component Value Date   NA 141 05/06/2019   K 4.2 05/06/2019   CL 108 05/06/2019   CO2 25 05/06/2019   GLUCOSE 100 (H) 05/06/2019   BUN 15 05/06/2019   CREATININE 0.66 05/06/2019   CALCIUM 9.0 05/06/2019   PROT 6.3 (L) 05/06/2019   ALBUMIN 4.2 05/06/2019   AST 15 05/06/2019   ALT 11 05/06/2019   ALKPHOS 146 (H) 05/06/2019   BILITOT 0.3 05/06/2019   GFRNONAA >60 05/06/2019   GFRAA >60 05/06/2019    Lab Results  Component Value Date   CEA1 1.02 01/02/2019    Medications: I have reviewed the patient's current medications.   Assessment/Plan: 1. Intrahepatic cholangiocarcinoma ? Abdominal ultrasound 10/08/2018-heterogenous right hepatic mass ? MRI abdomen 10/14/2018-2 enhancing right liver lesions, 4.8 x 4.5 x 4.1 cm, more superior lesion measured 3 x 2 x 2.5 cm, multiple additional subcentimeter T2 hyperintense lesions-too small to characterize. ? CT chest 11/04/2018-6 mm left upper lobe groundglass nodule, no evidence of metastatic disease in the chest ? Biopsy of right liver lesion at [facility] 11/10/2018-moderately differentiated adenocarcinoma consistent with cholangiocarcinoma; IDH1 alteration; MSI stable; mismatch repair status proficient; tumor mutational burden low, 3; PD-L1 negative ? CTs at [facility] 12/12/2018-right hepatic lobe masses, satellite lesions adjacent to the dominant mass, prominent periportal and aortocaval lymph nodes prominent left supraclavicular node ? Cycle 1 gemcitabine/cisplatin at [facility] on 12/12/2018 ? Cycle 2 gemcitabine/cisplatin 01/02/2019, 01/09/2019 ? Cycle 3 gemcitabine/cisplatin 01/23/2019 ? CTs at [facility] 02/02/2019-decrease in size of multiple hepatic masses, decreased size of prominent periportal and aortocaval nodes, unchanged prominent left supraclavicular node ? Cycle 4 gemcitabine/cisplatin 02/11/2019, Udenyca added and gemcitabine dose reduced with day 8 chemotherapy ? Cycle 5 gemcitabine/cisplatin 03/04/2019, gemcitabine reescalated to full dose ? Cycle 6 gemcitabine/cisplatin 03/25/2019 ? Cycle 7 gemcitabine/cisplatin 04/15/2019 ? Cycle 8 gemcitabine/cisplatin 05/06/2019 2. Family history of multiple cancers eluding breast, pancreatic, melanoma, appendiceal cancer, lung cancer 3. Osteoporosis 4. Neutropenia secondary to chemotherapy, Udenyca added with day 8 cycle 4     Disposition: Angela Fleming appears stable.  She continues to tolerate the chemotherapy well.  She will complete another cycle of gemcitabine/cisplatin beginning today.  She will undergo restaging CTs at [facility] after this cycle.  She will return for an office visit on 05/20/2019.  I encouraged her to obtain the COVID-19 vaccine.  [name], MD  05/06/2019  8:58 AM

## 2019-05-07 ENCOUNTER — Other Ambulatory Visit: Payer: 59

## 2019-05-07 ENCOUNTER — Ambulatory Visit: Payer: 59 | Admitting: Oncology

## 2019-05-07 ENCOUNTER — Ambulatory Visit: Payer: 59

## 2019-05-10 ENCOUNTER — Other Ambulatory Visit: Payer: Self-pay | Admitting: Oncology

## 2019-05-13 ENCOUNTER — Inpatient Hospital Stay: Payer: No Typology Code available for payment source

## 2019-05-13 ENCOUNTER — Other Ambulatory Visit: Payer: Self-pay

## 2019-05-13 VITALS — BP 128/77 | HR 83 | Temp 98.3°F | Resp 16 | Wt 116.5 lb

## 2019-05-13 DIAGNOSIS — C221 Intrahepatic bile duct carcinoma: Secondary | ICD-10-CM

## 2019-05-13 DIAGNOSIS — Z5111 Encounter for antineoplastic chemotherapy: Secondary | ICD-10-CM | POA: Diagnosis not present

## 2019-05-13 DIAGNOSIS — Z95828 Presence of other vascular implants and grafts: Secondary | ICD-10-CM

## 2019-05-13 LAB — CBC WITH DIFFERENTIAL (CANCER CENTER ONLY)
Abs Immature Granulocytes: 0.02 10*3/uL (ref 0.00–0.07)
Basophils Absolute: 0 10*3/uL (ref 0.0–0.1)
Basophils Relative: 1 %
Eosinophils Absolute: 0 10*3/uL (ref 0.0–0.5)
Eosinophils Relative: 1 %
HCT: 32.5 % — ABNORMAL LOW (ref 36.0–46.0)
Hemoglobin: 10.3 g/dL — ABNORMAL LOW (ref 12.0–15.0)
Immature Granulocytes: 1 %
Lymphocytes Relative: 44 %
Lymphs Abs: 1.1 10*3/uL (ref 0.7–4.0)
MCH: 30.9 pg (ref 26.0–34.0)
MCHC: 31.7 g/dL (ref 30.0–36.0)
MCV: 97.6 fL (ref 80.0–100.0)
Monocytes Absolute: 0.2 10*3/uL (ref 0.1–1.0)
Monocytes Relative: 10 %
Neutro Abs: 1 10*3/uL — ABNORMAL LOW (ref 1.7–7.7)
Neutrophils Relative %: 43 %
Platelet Count: 231 10*3/uL (ref 150–400)
RBC: 3.33 MIL/uL — ABNORMAL LOW (ref 3.87–5.11)
RDW: 13.5 % (ref 11.5–15.5)
WBC Count: 2.4 10*3/uL — ABNORMAL LOW (ref 4.0–10.5)
nRBC: 0 % (ref 0.0–0.2)

## 2019-05-13 LAB — CMP (CANCER CENTER ONLY)
ALT: 16 U/L (ref 0–44)
AST: 16 U/L (ref 15–41)
Albumin: 4.1 g/dL (ref 3.5–5.0)
Alkaline Phosphatase: 102 U/L (ref 38–126)
Anion gap: 10 (ref 5–15)
BUN: 11 mg/dL (ref 8–23)
CO2: 22 mmol/L (ref 22–32)
Calcium: 8.8 mg/dL — ABNORMAL LOW (ref 8.9–10.3)
Chloride: 108 mmol/L (ref 98–111)
Creatinine: 0.65 mg/dL (ref 0.44–1.00)
GFR, Est AFR Am: >60 mL/min (ref >60)
GFR, Estimated: >60 mL/min (ref >60)
Glucose, Bld: 97 mg/dL (ref 70–99)
Potassium: 4.3 mmol/L (ref 3.5–5.1)
Sodium: 140 mmol/L (ref 135–145)
Total Bilirubin: 0.3 mg/dL (ref 0.3–1.2)
Total Protein: 6.2 g/dL — ABNORMAL LOW (ref 6.5–8.1)

## 2019-05-13 LAB — MAGNESIUM: Magnesium: 1.7 mg/dL (ref 1.7–2.4)

## 2019-05-13 MED ORDER — SODIUM CHLORIDE 0.9% FLUSH
10.0000 mL | INTRAVENOUS | Status: DC | PRN
  Administered 2019-05-13: 10 mL
  Filled 2019-05-13: qty 10

## 2019-05-13 MED ORDER — SODIUM CHLORIDE 0.9 % IV SOLN
Freq: Once | INTRAVENOUS | Status: AC
  Filled 2019-05-13: qty 250

## 2019-05-13 MED ORDER — SODIUM CHLORIDE 0.9 % IV SOLN
Freq: Once | INTRAVENOUS | Status: AC
  Filled 2019-05-13: qty 5

## 2019-05-13 MED ORDER — SODIUM CHLORIDE 0.9 % IV SOLN
25.0000 mg/m2 | Freq: Once | INTRAVENOUS | Status: AC
  Administered 2019-05-13: 38 mg via INTRAVENOUS
  Filled 2019-05-13: qty 38

## 2019-05-13 MED ORDER — SODIUM CHLORIDE 0.9 % IV SOLN
1000.0000 mg/m2 | Freq: Once | INTRAVENOUS | Status: AC
  Administered 2019-05-13: 1482 mg via INTRAVENOUS
  Filled 2019-05-13: qty 38.98

## 2019-05-13 MED ORDER — HEPARIN SOD (PORK) LOCK FLUSH 100 UNIT/ML IV SOLN
500.0000 [IU] | Freq: Once | INTRAVENOUS | Status: AC | PRN
  Administered 2019-05-13: 500 [IU]
  Filled 2019-05-13: qty 5

## 2019-05-13 MED ORDER — PALONOSETRON HCL INJECTION 0.25 MG/5ML
INTRAVENOUS | Status: AC
  Filled 2019-05-13: qty 5

## 2019-05-13 MED ORDER — PALONOSETRON HCL INJECTION 0.25 MG/5ML
0.2500 mg | Freq: Once | INTRAVENOUS | Status: AC
  Administered 2019-05-13: 0.25 mg via INTRAVENOUS

## 2019-05-13 MED ORDER — POTASSIUM CHLORIDE 2 MEQ/ML IV SOLN
Freq: Once | INTRAVENOUS | Status: AC
  Filled 2019-05-13: qty 10

## 2019-05-13 NOTE — Patient Instructions (Signed)

## 2019-05-13 NOTE — Progress Notes (Signed)
Per Dr. Benay Spice: OK to treat w/ANC 1.0. Be sure she receives GCSF tomorrow.

## 2019-05-13 NOTE — Patient Instructions (Signed)
[facility] Discharge Instructions for Patients Receiving Chemotherapy  Today you received the following chemotherapy agents: gemcitabine and cisplatin.  To help prevent nausea and vomiting after your treatment, we encourage you to take your nausea medication as directed.   If you develop nausea and vomiting that is not controlled by your nausea medication, call the clinic.   BELOW ARE SYMPTOMS THAT SHOULD BE REPORTED IMMEDIATELY:  *FEVER GREATER THAN 100.5 F  *CHILLS WITH OR WITHOUT FEVER  NAUSEA AND VOMITING THAT IS NOT CONTROLLED WITH YOUR NAUSEA MEDICATION  *UNUSUAL SHORTNESS OF BREATH  *UNUSUAL BRUISING OR BLEEDING  TENDERNESS IN MOUTH AND THROAT WITH OR WITHOUT PRESENCE OF ULCERS  *URINARY PROBLEMS  *BOWEL PROBLEMS  UNUSUAL RASH Items with * indicate a potential emergency and should be followed up as soon as possible.  Feel free to call the clinic should you have any questions or concerns. The clinic phone number is [phone].  Please show the CHEMO ALERT CARD at check-in to the Emergency Department and triage nurse.   

## 2019-05-14 ENCOUNTER — Other Ambulatory Visit: Payer: 59

## 2019-05-14 ENCOUNTER — Other Ambulatory Visit: Payer: Self-pay

## 2019-05-14 ENCOUNTER — Ambulatory Visit: Payer: 59

## 2019-05-14 ENCOUNTER — Inpatient Hospital Stay: Payer: 59 | Attending: Oncology

## 2019-05-14 VITALS — BP 111/52 | HR 87 | Temp 98.7°F | Resp 18

## 2019-05-14 DIAGNOSIS — C221 Intrahepatic bile duct carcinoma: Secondary | ICD-10-CM

## 2019-05-14 MED ORDER — PEGFILGRASTIM-CBQV 6 MG/0.6ML ~~LOC~~ SOSY: 6.0000 mg | PREFILLED_SYRINGE | Freq: Once | SUBCUTANEOUS | Status: DC

## 2019-05-14 NOTE — Patient Instructions (Signed)

## 2019-05-21 ENCOUNTER — Ambulatory Visit: Payer: 59 | Admitting: Oncology

## 2019-05-21 ENCOUNTER — Other Ambulatory Visit: Payer: 59

## 2019-05-21 ENCOUNTER — Ambulatory Visit: Payer: 59

## 2019-05-22 NOTE — Progress Notes (Signed)
Pharmacist Chemotherapy Monitoring - Follow Up Assessment    I verify that I have reviewed each item in the below checklist:  . Regimen for the patient is scheduled for the appropriate day and plan matches scheduled date. Marland Kitchen Appropriate non-routine labs are ordered dependent on drug ordered. . If applicable, additional medications reviewed and ordered per protocol based on lifetime cumulative doses and/or treatment regimen.   Plan for follow-up and/or issues identified: No . I-vent associated with next due treatment: No . MD and/or nursing notified: No  Angela Fleming [facility] 05/22/2019 1:58 PM

## 2019-05-24 ENCOUNTER — Other Ambulatory Visit: Payer: Self-pay | Admitting: Oncology

## 2019-05-25 NOTE — Progress Notes (Signed)
Pharmacist Chemotherapy Monitoring - Follow Up Assessment    I verify that I have reviewed each item in the below checklist:  . Regimen for the patient is scheduled for the appropriate day and plan matches scheduled date. Marland Kitchen Appropriate non-routine labs are ordered dependent on drug ordered. . If applicable, additional medications reviewed and ordered per protocol based on lifetime cumulative doses and/or treatment regimen.   Plan for follow-up and/or issues identified: Yes . I-vent associated with next due treatment: Yes . MD and/or nursing notified: No  Angela Fleming [facility] 05/25/2019 4:11 PM

## 2019-05-26 ENCOUNTER — Encounter: Payer: Self-pay | Admitting: Oncology

## 2019-05-27 ENCOUNTER — Inpatient Hospital Stay: Payer: 59

## 2019-05-27 ENCOUNTER — Encounter: Payer: Self-pay | Admitting: *Deleted

## 2019-05-27 ENCOUNTER — Inpatient Hospital Stay: Payer: 59 | Admitting: Oncology

## 2019-05-28 ENCOUNTER — Ambulatory Visit: Payer: 59

## 2019-05-28 ENCOUNTER — Telehealth: Payer: Self-pay | Admitting: *Deleted

## 2019-05-28 NOTE — Telephone Encounter (Signed)
Called to obtain date of 1st OV w/Dr. Benay Spice and 1st chemo treatment at [facility]. Provided dates 12/18/18 and 01/02/19.

## 2019-06-02 ENCOUNTER — Encounter: Payer: Self-pay | Admitting: Oncology

## 2019-06-04 ENCOUNTER — Ambulatory Visit: Payer: 59

## 2019-06-16 ENCOUNTER — Encounter: Payer: Self-pay | Admitting: Oncology

## 2019-09-24 ENCOUNTER — Telehealth: Payer: Self-pay | Admitting: *Deleted

## 2019-09-24 NOTE — Telephone Encounter (Signed)
Called patient to f/u on how treatment at [facility] is going. She is feeling well. Had HAIP placed in May and has pump refilled w/FUDR every 2 weeks (3rd cycle) and IV Gemzar/Oxaliplatin same day pump is filled. Next scan is 8/19, and this will determine next step-continued chemo vs surgery. Appreciates the call. She will let us know her CT results.

## 2019-10-02 ENCOUNTER — Other Ambulatory Visit: Payer: Self-pay | Admitting: *Deleted

## 2019-10-02 ENCOUNTER — Encounter: Payer: Self-pay | Admitting: Oncology

## 2019-10-02 ENCOUNTER — Ambulatory Visit
Admission: RE | Admit: 2019-10-02 | Discharge: 2019-10-02 | Disposition: A | Discharge status: Home / Self Care | Payer: Self-pay | Source: Ambulatory Visit | Attending: Oncology | Admitting: Oncology

## 2019-10-02 DIAGNOSIS — C221 Intrahepatic bile duct carcinoma: Secondary | ICD-10-CM

## 2019-10-02 NOTE — Progress Notes (Signed)
Requested radiology import images from CT C/A/P at [facility] on 10/01/19.

## 2019-10-05 ENCOUNTER — Telehealth: Payer: Self-pay | Admitting: *Deleted

## 2019-10-05 NOTE — Telephone Encounter (Signed)
Called patient and offered appointment w/Dr. Benay Spice for 10/21/19 at 0900. She accepted this appointment( 8/25 and 8/26 were offered, but she is on vacation till 8/30).

## 2019-10-21 ENCOUNTER — Other Ambulatory Visit: Payer: Self-pay

## 2019-10-21 ENCOUNTER — Inpatient Hospital Stay: Payer: No Typology Code available for payment source | Attending: Oncology | Admitting: Oncology

## 2019-10-21 ENCOUNTER — Telehealth: Payer: Self-pay | Admitting: Oncology

## 2019-10-21 VITALS — BP 125/73 | HR 91 | Temp 97.9°F | Resp 20 | Ht 64.0 in | Wt 126.1 lb

## 2019-10-21 DIAGNOSIS — C221 Intrahepatic bile duct carcinoma: Secondary | ICD-10-CM

## 2019-10-21 NOTE — Progress Notes (Signed)
[place] OFFICE PROGRESS NOTE   Diagnosis: Cholangiocarcinoma  INTERVAL HISTORY:   Angela Fleming was last seen here in March.  She has been followed by Dr. Fanny Skates for hepatic infusion therapy, systemic therapy, and restaging evaluation.  She underwent restaging on 10/01/2019.  She was presented at the multidisciplinary conference and a decision was made to continue the HAI and systemic therapy as opposed to surgery.  Surgery will require an extended right hepatectomy  The liver tumor burden has responded to the FUDR and gemcitabine/oxaliplatin.  She was last treated on 10/15/2019 with a refill of the FUDR pump and gemcitabine/oxaliplatin.  She reports mild intermittent numbness in the extremities.  This does not interfere with activity.  She has cold sensitivity lasting several days following treatment with oxaliplatin.  No abdominal pain.  Her appetite has increased with the addition of Decadron.  Objective:  Vital signs in last 24 hours:  Blood pressure 125/73, pulse 91, temperature 97.9 F (36.6 C), temperature source Tympanic, resp. rate 20, height 5' 4"  (1.626 m), weight 126 lb 1.6 oz (57.2 kg), SpO2 100 %.    Resp: Lungs clear bilaterally Cardio: Regular rate and rhythm GI: No hepatomegaly, nontender, left abdomen hepatic artery infusion pump Vascular: No leg edema    Portacath/PICC-without erythema  Lab Results:  Lab Results  Component Value Date   WBC 2.4 (L) 05/13/2019   HGB 10.3 (L) 05/13/2019   HCT 32.5 (L) 05/13/2019   MCV 97.6 05/13/2019   PLT 231 05/13/2019   NEUTROABS 1.0 (L) 05/13/2019    CMP  Lab Results  Component Value Date   NA 140 05/13/2019   K 4.3 05/13/2019   CL 108 05/13/2019   CO2 22 05/13/2019   GLUCOSE 97 05/13/2019   BUN 11 05/13/2019   CREATININE 0.65 05/13/2019   CALCIUM 8.8 (L) 05/13/2019   PROT 6.2 (L) 05/13/2019   ALBUMIN 4.1 05/13/2019   AST 16 05/13/2019   ALT 16 05/13/2019   ALKPHOS 102 05/13/2019   BILITOT 0.3  05/13/2019   GFRNONAA >60 05/13/2019   GFRAA >60 05/13/2019    Lab Results  Component Value Date   CEA1 1.02 01/02/2019     Medications: I have reviewed the patient's current medications.   Assessment/Plan: 1. Intrahepatic cholangiocarcinoma ? Abdominal ultrasound 10/08/2018-heterogenous right hepatic mass ? MRI abdomen 10/14/2018-2 enhancing right liver lesions, 4.8 x 4.5 x 4.1 cm, more superior lesion measured 3 x 2 x 2.5 cm, multiple additional subcentimeter T2 hyperintense lesions-too small to characterize. ? CT chest 11/04/2018-6 mm left upper lobe groundglass nodule, no evidence of metastatic disease in the chest ? Biopsy of right liver lesion at [facility] 11/10/2018-moderately differentiated adenocarcinoma consistent with cholangiocarcinoma; IDH1 alteration; MSI stable; mismatch repair status proficient; tumor mutational burden low, 3; PD-L1 negative ? CTs at [facility] 12/12/2018-right hepatic lobe masses, satellite lesions adjacent to the dominant mass, prominent periportal and aortocaval lymph nodes prominent left supraclavicular node ? Cycle 1 gemcitabine/cisplatin at [facility] on 12/12/2018 ? Cycle 2 gemcitabine/cisplatin 01/02/2019, 01/09/2019 ? Cycle 3 gemcitabine/cisplatin 01/23/2019 ? CTs at [facility] 02/02/2019-decrease in size of multiple hepatic masses, decreased size of prominent periportal and aortocaval nodes, unchanged prominent left supraclavicular node ? Cycle 4 gemcitabine/cisplatin 02/11/2019, Udenyca added and gemcitabine dose reduced with day 8 chemotherapy ? Cycle 5 gemcitabine/cisplatin 03/04/2019, gemcitabine reescalated to full dose ? Cycle 6 gemcitabine/cisplatin 03/25/2019 ? Cycle 7 gemcitabine/cisplatin 04/15/2019 ? Cycle 8 gemcitabine/cisplatin 05/06/2019 ? CT 05/18/2019-decreased size of multiple right hepatic masses ? 06/17/2019-hepatic arterial infusion  pump placed, resection of hepatic artery/portal nodes, 1/7 portal nodes positive ? 07/10/2019-FUDR No. 1 ? 07/23/2019  gemcitabine 800 mg/m added ? 08/06/2019-FUDR No. 2, 55 mg (dose reduced due to mild increase in bilirubin (, gemcitabine 800 mg/m and oxaliplatin 68 mg/m ? 08/20/2019-treatment continued, Decadron and pump continue due to mild increase in alkaline phosphatase ? 09/17/2019, FUDR No. 3 at 28 mg (dose reduced due to continued increase in alkaline phosphatase) ? 10/01/2019, CTs with continued response to therapy, decrease in liver disease ? 10/01/2019, therapy continued ? 10/15/2019, therapy continue with gemcitabine and oxaliplatin, FUDR restarted at 55 mg 2. Family history of multiple cancers eluding breast, pancreatic, melanoma, appendiceal cancer, lung cancer 3. Osteoporosis 4. Neutropenia secondary to chemotherapy, Udenyca added with day 8 cycle 4   Disposition: Angela Fleming appears well.  She has been treated with  hepatic arterial FUDR and gemcitabine/oxaliplatin for the past several months.  She is tolerating the treatment well.  Restaging CTs in August confirmed continued response to therapy with a decrease in the hepatic tumor burden.  The [facility] multidisciplinary team recommends continuing hepatic and systemic therapy as opposed to surgery.  We discussed potential future treatment options including radiation, salvage systemic chemotherapy, and IDH1 directed therapies.  She will continue close clinical follow-up with Dr. Fanny Skates.  She will return for an office visit in 3 months.  I am available to see her sooner as needed.  I recommended she discuss the indication for a Covid booster vaccine with Dr. Fanny Skates.  [name], MD  10/21/2019  9:28 AM

## 2019-10-21 NOTE — Telephone Encounter (Signed)
Scheduled appointment per 9/8 los. Patient is aware of appointment. Patient declined calendar print out.

## 2019-11-03 ENCOUNTER — Encounter: Payer: Self-pay | Admitting: Oncology

## 2019-11-06 ENCOUNTER — Inpatient Hospital Stay: Payer: No Typology Code available for payment source

## 2019-11-06 ENCOUNTER — Other Ambulatory Visit: Payer: Self-pay

## 2019-11-06 DIAGNOSIS — Z23 Encounter for immunization: Secondary | ICD-10-CM

## 2019-11-06 NOTE — Progress Notes (Signed)
   Covid-19 Vaccination Clinic  Name:  Zuriyah Shatz    MRN: [id number] DOB: 1955/06/07  11/06/2019  Ms. Beeney was observed post Covid-19 immunization for 15 minutes without incident. She was provided with Vaccine Information Sheet and instruction to access the V-Safe system.   Ms. Ornstein was instructed to call 911 with any severe reactions post vaccine: Marland Kitchen Difficulty breathing  . Swelling of face and throat  . A fast heartbeat  . A bad rash all over body  . Dizziness and weakness

## 2019-11-27 ENCOUNTER — Telehealth: Payer: Self-pay | Admitting: *Deleted

## 2019-11-27 ENCOUNTER — Encounter: Payer: Self-pay | Admitting: Oncology

## 2019-11-27 DIAGNOSIS — C221 Intrahepatic bile duct carcinoma: Secondary | ICD-10-CM

## 2019-11-27 NOTE — Telephone Encounter (Signed)
Dr. Fanny Skates asking for CMP be drawn on 10/20 in am per patient's note. Scheduled w/her for 0845 and she prefers a peripheral draw for this lab.

## 2019-12-02 ENCOUNTER — Inpatient Hospital Stay: Payer: No Typology Code available for payment source | Attending: Oncology

## 2019-12-02 ENCOUNTER — Other Ambulatory Visit: Payer: Self-pay

## 2019-12-02 DIAGNOSIS — C221 Intrahepatic bile duct carcinoma: Secondary | ICD-10-CM | POA: Insufficient documentation

## 2019-12-02 LAB — CMP (CANCER CENTER ONLY)
ALT: 179 U/L — ABNORMAL HIGH (ref 0–44)
AST: 36 U/L (ref 15–41)
Albumin: 3.7 g/dL (ref 3.5–5.0)
Alkaline Phosphatase: 268 U/L — ABNORMAL HIGH (ref 38–126)
Anion gap: 6 (ref 5–15)
BUN: 16 mg/dL (ref 8–23)
CO2: 27 mmol/L (ref 22–32)
Calcium: 9.5 mg/dL (ref 8.9–10.3)
Chloride: 105 mmol/L (ref 98–111)
Creatinine: 0.73 mg/dL (ref 0.44–1.00)
GFR, Estimated: >60 mL/min (ref >60)
Glucose, Bld: 90 mg/dL (ref 70–99)
Potassium: 4.2 mmol/L (ref 3.5–5.1)
Sodium: 138 mmol/L (ref 135–145)
Total Bilirubin: 0.3 mg/dL (ref 0.3–1.2)
Total Protein: 6.8 g/dL (ref 6.5–8.1)

## 2019-12-02 LAB — CBC WITH DIFFERENTIAL (CANCER CENTER ONLY)
Abs Immature Granulocytes: 0.03 10*3/uL (ref 0.00–0.07)
Basophils Absolute: 0.1 10*3/uL (ref 0.0–0.1)
Basophils Relative: 1 %
Eosinophils Absolute: 0.1 10*3/uL (ref 0.0–0.5)
Eosinophils Relative: 1 %
HCT: 36.6 % (ref 36.0–46.0)
Hemoglobin: 11.5 g/dL — ABNORMAL LOW (ref 12.0–15.0)
Immature Granulocytes: 1 %
Lymphocytes Relative: 38 %
Lymphs Abs: 1.9 10*3/uL (ref 0.7–4.0)
MCH: 29.2 pg (ref 26.0–34.0)
MCHC: 31.4 g/dL (ref 30.0–36.0)
MCV: 92.9 fL (ref 80.0–100.0)
Monocytes Absolute: 0.7 10*3/uL (ref 0.1–1.0)
Monocytes Relative: 14 %
Neutro Abs: 2.2 10*3/uL (ref 1.7–7.7)
Neutrophils Relative %: 45 %
Platelet Count: 272 10*3/uL (ref 150–400)
RBC: 3.94 MIL/uL (ref 3.87–5.11)
RDW: 15.2 % (ref 11.5–15.5)
WBC Count: 4.9 10*3/uL (ref 4.0–10.5)
nRBC: 0 % (ref 0.0–0.2)

## 2019-12-28 ENCOUNTER — Other Ambulatory Visit: Payer: Self-pay | Admitting: Geriatric Medicine

## 2019-12-28 ENCOUNTER — Ambulatory Visit: Payer: No Typology Code available for payment source

## 2019-12-28 DIAGNOSIS — Z1231 Encounter for screening mammogram for malignant neoplasm of breast: Secondary | ICD-10-CM

## 2020-01-19 ENCOUNTER — Encounter: Payer: Self-pay | Admitting: Oncology

## 2020-01-20 ENCOUNTER — Other Ambulatory Visit: Payer: Self-pay

## 2020-01-20 ENCOUNTER — Telehealth: Payer: Self-pay | Admitting: Oncology

## 2020-01-20 ENCOUNTER — Inpatient Hospital Stay: Payer: No Typology Code available for payment source | Attending: Oncology | Admitting: Oncology

## 2020-01-20 VITALS — BP 133/73 | HR 97 | Temp 97.9°F | Resp 20 | Ht 64.0 in | Wt 131.2 lb

## 2020-01-20 DIAGNOSIS — D701 Agranulocytosis secondary to cancer chemotherapy: Secondary | ICD-10-CM | POA: Insufficient documentation

## 2020-01-20 DIAGNOSIS — T451X5A Adverse effect of antineoplastic and immunosuppressive drugs, initial encounter: Secondary | ICD-10-CM | POA: Insufficient documentation

## 2020-01-20 DIAGNOSIS — Z803 Family history of malignant neoplasm of breast: Secondary | ICD-10-CM | POA: Diagnosis not present

## 2020-01-20 DIAGNOSIS — Z800 Family history of malignant neoplasm of digestive organs: Secondary | ICD-10-CM | POA: Insufficient documentation

## 2020-01-20 DIAGNOSIS — M810 Age-related osteoporosis without current pathological fracture: Secondary | ICD-10-CM | POA: Insufficient documentation

## 2020-01-20 DIAGNOSIS — R200 Anesthesia of skin: Secondary | ICD-10-CM | POA: Insufficient documentation

## 2020-01-20 DIAGNOSIS — Z801 Family history of malignant neoplasm of trachea, bronchus and lung: Secondary | ICD-10-CM | POA: Diagnosis not present

## 2020-01-20 DIAGNOSIS — Z808 Family history of malignant neoplasm of other organs or systems: Secondary | ICD-10-CM | POA: Diagnosis not present

## 2020-01-20 DIAGNOSIS — C221 Intrahepatic bile duct carcinoma: Secondary | ICD-10-CM | POA: Insufficient documentation

## 2020-01-20 NOTE — Progress Notes (Signed)
Angela Fleming OFFICE PROGRESS NOTE   Diagnosis: Cholangiocarcinoma  INTERVAL HISTORY:   Angela Fleming returns as scheduled.  She continues treatment with gemcitabine/oxaliplatin and FUDR.  She was last treated  on 01/06/2020.  She reports mild peripheral numbness.  This does not interfere with activity.  She otherwise feels well.  No abdominal pain. She is completing a course of doxycycline for treatment of an upper respiratory infection.  Chemotherapy and the FUDR have been held intermittently secondary to elevated liver enzymes. Objective:  Vital signs in last 24 hours:  Blood pressure 133/73, pulse 97, temperature 97.9 F (36.6 C), temperature source Tympanic, resp. rate 20, height _0  (1.626 m), weight 131 lb 3.2 oz (59.5 kg), SpO2 99 %.    Resp: Lungs clear bilaterally Cardio: Regular rate and rhythm GI: No nontender, no mass, no hepatomegaly, left abdomen infusion pump Vascular: No leg edema Neuro: Mild to moderate loss of vibratory sense at the fingertips bilaterally   Portacath/PICC-without erythema  Lab Results:  Lab Results  Component Value Date   WBC 4.9 12/02/2019   HGB 11.5 (L) 12/02/2019   HCT 36.6 12/02/2019   MCV 92.9 12/02/2019   PLT 272 12/02/2019   NEUTROABS 2.2 12/02/2019    CMP  Lab Results  Component Value Date   NA 138 12/02/2019   K 4.2 12/02/2019   CL 105 12/02/2019   CO2 27 12/02/2019   GLUCOSE 90 12/02/2019   BUN 16 12/02/2019   CREATININE 0.73 12/02/2019   CALCIUM 9.5 12/02/2019   PROT 6.8 12/02/2019   ALBUMIN 3.7 12/02/2019   AST 36 12/02/2019   ALT 179 (H) 12/02/2019   ALKPHOS 268 (H) 12/02/2019   BILITOT 0.3 12/02/2019   GFRNONAA >60 12/02/2019   GFRAA >60 05/13/2019   Medications: I have reviewed the patient's current medications.   Assessment/Plan: 1.  Intrahepatic cholangiocarcinoma ? Abdominal ultrasound 10/08/2018-heterogenous right hepatic mass ? MRI abdomen 10/14/2018-2 enhancing right liver lesions, 4.8 x  4.5 x 4.1 cm, more superior lesion measured 3 x 2 x 2.5 cm, multiple additional subcentimeter T2 hyperintense lesions-too small to characterize. ? CT chest 11/04/2018-6 mm left upper lobe groundglass nodule, no evidence of metastatic disease in the chest ? Biopsy of right liver lesion at [facility] 11/10/2018-moderately differentiated adenocarcinoma consistent with cholangiocarcinoma; IDH1 alteration; MSI stable; mismatch repair status proficient; tumor mutational burden low, 3; PD-L1 negative ? CTs at [facility] 12/12/2018-right hepatic lobe masses, satellite lesions adjacent to the dominant mass, prominent periportal and aortocaval lymph nodes prominent left supraclavicular node ? Cycle 1 gemcitabine/cisplatin at [facility] on 12/12/2018 ? Cycle 2 gemcitabine/cisplatin 01/02/2019, 01/09/2019 ? Cycle 3 gemcitabine/cisplatin 01/23/2019 ? CTs at [facility] 02/02/2019-decrease in size of multiple hepatic masses, decreased size of prominent periportal and aortocaval nodes, unchanged prominent left supraclavicular node ? Cycle 4 gemcitabine/cisplatin 02/11/2019, Udenyca added and gemcitabine dose reduced with day 8 chemotherapy ? Cycle 5 gemcitabine/cisplatin 03/04/2019, gemcitabine reescalated to full dose ? Cycle 6 gemcitabine/cisplatin 03/25/2019 ? Cycle 7 gemcitabine/cisplatin 04/15/2019 ? Cycle 8 gemcitabine/cisplatin 05/06/2019 ? CT 05/18/2019-decreased size of multiple right hepatic masses ? 06/17/2019-hepatic arterial infusion pump placed, resection of hepatic artery/portal nodes, 1/7 portal nodes positive ? 07/10/2019-FUDR No. 1 ? 07/23/2019 gemcitabine 800 mg/m added ? 08/06/2019-FUDR No. 2, 55 mg (dose reduced due to mild increase in bilirubin (, gemcitabine 800 mg/m and oxaliplatin 68 mg/m ? 08/20/2019-treatment continued, Decadron and pump continue due to mild increase in alkaline phosphatase ? 09/17/2019, FUDR No. 3 at 28 mg (dose reduced due to  continued increase in alkaline phosphatase) ? 10/01/2019, CTs with  continued response to therapy, decrease in liver disease ? 10/01/2019, therapy continued ? 10/15/2019, therapy continue with gemcitabine and oxaliplatin, FUDR restarted at 55 mg ? 11/12/2019-alkaline phosphatase elevated, FUDR held, Decadron given in hepatic infusion flush ? 11/26/2019 chemotherapy held secondary to elevated liver enzymes ? 11/26/2019-CT with continued improvement in the hepatic tumor burden ? 12/10/2019-chemotherapy restarted with gemcitabine/oxaliplatin ? 12/24/2019 chemotherapy continued, FUDR 28 mg 2. Family history of multiple cancers eluding breast, pancreatic, melanoma, appendiceal cancer, lung cancer 3. Osteoporosis 4. Neutropenia secondary to chemotherapy, Udenyca added with day 8 cycle 4    Disposition: [name] appears stable.  She continues treatment under the direction of Dr. Fanny Skates at [facility].  She is being treated with gemcitabine, oxaliplatin, and FUDR.  There was improvement in the liver tumor burden on the most recent restaging CT 11/26/2019.  She is scheduled to undergo another restaging evaluation in early January.  She will return for an office visit here in 2 months.  She will discuss the neuropathy symptoms with Dr. Fanny Skates.  She has received the COVID-19 vaccines.  She plans to obtain an influenza vaccine when she has completed the course of doxycycline.  [name], MD  01/20/2020  10:36 AM

## 2020-01-20 NOTE — Telephone Encounter (Signed)
Scheduled per los. Declined printout  

## 2020-02-09 ENCOUNTER — Telehealth: Payer: Self-pay | Admitting: *Deleted

## 2020-02-09 ENCOUNTER — Other Ambulatory Visit (HOSPITAL_COMMUNITY): Payer: Self-pay | Admitting: Oncology

## 2020-02-09 ENCOUNTER — Encounter: Payer: Self-pay | Admitting: Oncology

## 2020-02-09 DIAGNOSIS — U071 COVID-19: Secondary | ICD-10-CM

## 2020-02-09 NOTE — Progress Notes (Signed)
I connected by phone with  Angela Fleming  to discuss the potential use of an new treatment for mild to moderate COVID-19 viral infection in non-hospitalized patients.   This patient is a age/sex that meets the FDA criteria for Emergency Use Authorization of casirivimab\imdevimab.  Has a (+) direct SARS-CoV-2 viral test result 1. Has mild or moderate COVID-19  2. Is ? 62 years of age and weighs ? 40 kg 3. Is NOT hospitalized due to COVID-19 4. Is NOT requiring oxygen therapy or requiring an increase in baseline oxygen flow rate due to COVID-19 5. Is within 10 days of symptom onset 6. Has at least one of the high risk factor(s) for progression to severe COVID-19 and/or hospitalization as defined in EUA. ? Specific high risk criteria : Past Medical History:  Diagnosis Date  . ALLERGIC RHINITIS 01/31/2010  . Anxiety state, unspecified 03/16/2009  . CHANGE IN BOWELS 01/31/2010  . CHICKENPOX, HX OF 03/16/2009  . Family history of breast cancer   . Family history of colon cancer   . Family history of lung cancer   . Family history of melanoma   . Family history of pancreatic cancer   . GERD 03/16/2009  . UNSPECIFIED MYALGIA AND MYOSITIS 01/31/2010   Vaccinated  Immunocompromised- active cancer tx  Symptom onset  02/08/20   I have spoken and communicated the following to the patient or parent/caregiver:   1. FDA has authorized the emergency use of casirivimab\imdevimab for the treatment of mild to moderate COVID-19 in adults and pediatric patients with positive results of direct SARS-CoV-2 viral testing who are 87 years of age and older weighing at least 40 kg, and who are at high risk for progressing to severe COVID-19 and/or hospitalization.   2. The significant known and potential risks and benefits of casirivimab\imdevimab, and the extent to which such potential risks and benefits are unknown.   3. Information on available alternative treatments and the risks and benefits of those  alternatives, including clinical trials.   4. Patients treated with casirivimab\imdevimab should continue to self-isolate and use infection control measures (e.g., wear mask, isolate, social distance, avoid sharing personal items, clean and disinfect "high touch" surfaces, and frequent handwashing) according to CDC guidelines.    5. The patient or parent/caregiver has the option to accept or refuse casirivimab\imdevimab .   After reviewing this information with the patient, The patient agreed to proceed with receiving casirivimab\imdevimab infusion and will be provided a copy of the Fact sheet prior to receiving the infusion.[name], AGNP-C [phone] (Infusion Center Hotline)

## 2020-02-09 NOTE — Telephone Encounter (Signed)
Son came home this weekend to visit and she started having cold symptoms yesterday and has now progressed to body aches as well. Son has tested + for COVID and her home test was + as well today. Per advice of NP at [facility] she is trying to find an appointment to have official PCR test done. Provided her phone # for [facility] for testing appointments. She is also asking if she will qualify for the monoclonal antibody treatment--[facility] NP said she should have it. She has been trying to call them to be screened to see if she qualifies. She has had both covid vaccines and the booster. Informed her that NP in our office works with the infusion center and will be asked to see if she qualifies.

## 2020-02-10 ENCOUNTER — Ambulatory Visit (HOSPITAL_COMMUNITY)
Admission: RE | Admit: 2020-02-10 | Discharge: 2020-02-10 | Disposition: A | Discharge status: Home / Self Care | Payer: No Typology Code available for payment source | Source: Ambulatory Visit | Attending: Pulmonary Disease | Admitting: Pulmonary Disease

## 2020-02-10 DIAGNOSIS — U071 COVID-19: Secondary | ICD-10-CM

## 2020-02-10 MED ORDER — FAMOTIDINE IN NACL 20-0.9 MG/50ML-% IV SOLN: 20.0000 mg | Freq: Once | INTRAVENOUS | Status: DC | PRN

## 2020-02-10 MED ORDER — METHYLPREDNISOLONE SODIUM SUCC 125 MG IJ SOLR: 125.0000 mg | Freq: Once | INTRAMUSCULAR | Status: DC | PRN

## 2020-02-10 MED ORDER — HEPARIN SOD (PORK) LOCK FLUSH 100 UNIT/ML IV SOLN
500.0000 [IU] | INTRAVENOUS | Status: AC | PRN
  Administered 2020-02-10: 500 [IU]

## 2020-02-10 MED ORDER — ALBUTEROL SULFATE HFA 108 (90 BASE) MCG/ACT IN AERS: 2.0000 | INHALATION_SPRAY | Freq: Once | RESPIRATORY_TRACT | Status: DC | PRN

## 2020-02-10 MED ORDER — DIPHENHYDRAMINE HCL 50 MG/ML IJ SOLN: 50.0000 mg | Freq: Once | INTRAMUSCULAR | Status: DC | PRN

## 2020-02-10 MED ORDER — SODIUM CHLORIDE 0.9 % IV SOLN: Freq: Once | INTRAVENOUS | Status: AC

## 2020-02-10 MED ORDER — EPINEPHRINE 0.3 MG/0.3ML IJ SOAJ: 0.3000 mg | Freq: Once | INTRAMUSCULAR | Status: DC | PRN

## 2020-02-10 MED ORDER — SODIUM CHLORIDE 0.9 % IV SOLN: INTRAVENOUS | Status: DC | PRN

## 2020-02-10 NOTE — Progress Notes (Signed)
°  Diagnosis: COVID-19  Physician:  Shan Levans  Procedure: [facility] Infusion [facility] Med: casirivimab\imdevimab infusion - Provided patient with casirivimab\imdevimab fact sheet for patients, parents and caregivers prior to infusion.  Complications: No immediate complications noted.  Discharge: Discharged home   William Dalton 02/10/2020

## 2020-02-10 NOTE — Discharge Instructions (Signed)
10 Things You Can Do to Manage Your COVID-19 Symptoms at Home If you have possible or confirmed COVID-19: 1. Stay home from work and school. And stay away from other public places. If you must go out, avoid using any kind of public transportation, ridesharing, or taxis. 2. Monitor your symptoms carefully. If your symptoms get worse, call your healthcare provider immediately. 3. Get rest and stay hydrated. 4. If you have a medical appointment, call the healthcare provider ahead of time and tell them that you have or may have COVID-19. 5. For medical emergencies, call 911 and notify the dispatch personnel that you have or may have COVID-19. 6. Cover your cough and sneezes with a tissue or use the inside of your elbow. 7. Wash your hands often with soap and water for at least 20 seconds or clean your hands with an alcohol-based hand sanitizer that contains at least 60% alcohol. 8. As much as possible, stay in a specific room and away from other people in your home. Also, you should use a separate bathroom, if available. If you need to be around other people in or outside of the home, wear a mask. 9. Avoid sharing personal items with other people in your household, like dishes, towels, and bedding. 10. Clean all surfaces that are touched often, like counters, tabletops, and doorknobs. Use household cleaning sprays or wipes according to the label instructions. cdc.gov/coronavirus 08/13/2018 This information is not intended to replace advice given to you by your health care provider. Make sure you discuss any questions you have with your health care provider. Document Revised: 01/15/2019 Document Reviewed: 01/15/2019 Elsevier Patient Education  2020 Elsevier Inc. What types of side effects do monoclonal antibody drugs cause?  Common side effects  In general, the more common side effects caused by monoclonal antibody drugs include: . Allergic reactions, such as hives or itching . Flu-like signs and  symptoms, including chills, fatigue, fever, and muscle aches and pains . Nausea, vomiting . Diarrhea . Skin rashes . Low blood pressure   The CDC is recommending patients who receive monoclonal antibody treatments wait at least 90 days before being vaccinated.  Currently, there are no data on the safety and efficacy of mRNA COVID-19 vaccines in persons who received monoclonal antibodies or convalescent plasma as part of COVID-19 treatment. Based on the estimated half-life of such therapies as well as evidence suggesting that reinfection is uncommon in the 90 days after initial infection, vaccination should be deferred for at least 90 days, as a precautionary measure until additional information becomes available, to avoid interference of the antibody treatment with vaccine-induced immune responses. If you have any questions or concerns after the infusion please call the Advanced Practice Provider on call at [phone]. This number is ONLY intended for your use regarding questions or concerns about the infusion post-treatment side-effects.  Please do not provide this number to others for use. For return to work notes please contact your primary care provider.   If someone you know is interested in receiving treatment please have them call the COVID hotline at [phone].   

## 2020-02-10 NOTE — Progress Notes (Signed)
Patient reviewed Fact Sheet for Patients, Parents, and Caregivers for Emergency Use Authorization (EUA) of Casirivimab and Imdevimab for the Treatment of Coronavirus. Patient also reviewed and is agreeable to the estimated cost of treatment. Patient is agreeable to proceed.   

## 2020-02-16 ENCOUNTER — Ambulatory Visit: Payer: Medicaid Other

## 2020-03-14 ENCOUNTER — Encounter: Payer: Self-pay | Admitting: Oncology

## 2020-03-25 ENCOUNTER — Ambulatory Visit: Payer: Medicaid Other | Admitting: Oncology

## 2020-06-24 ENCOUNTER — Encounter: Payer: Self-pay | Admitting: Oncology

## 2020-06-24 ENCOUNTER — Telehealth: Payer: Self-pay | Admitting: *Deleted

## 2020-06-24 ENCOUNTER — Other Ambulatory Visit: Payer: Self-pay | Admitting: *Deleted

## 2020-06-24 DIAGNOSIS — C221 Intrahepatic bile duct carcinoma: Secondary | ICD-10-CM

## 2020-06-24 NOTE — Telephone Encounter (Signed)
Pt called requesting labs and f/u appt with Dr.Sherrill due to recent labs being elevated. Pt scheduled for labs and f/u with Dr.Sherrill on 5/18. Pt called and notified of time and date

## 2020-06-27 ENCOUNTER — Inpatient Hospital Stay: Payer: No Typology Code available for payment source

## 2020-06-27 ENCOUNTER — Other Ambulatory Visit: Payer: Self-pay

## 2020-06-27 ENCOUNTER — Inpatient Hospital Stay: Payer: No Typology Code available for payment source | Attending: Oncology | Admitting: Oncology

## 2020-06-27 VITALS — BP 125/70 | HR 79 | Temp 98.5°F | Resp 18 | Ht 64.0 in | Wt 131.0 lb

## 2020-06-27 DIAGNOSIS — M810 Age-related osteoporosis without current pathological fracture: Secondary | ICD-10-CM | POA: Diagnosis not present

## 2020-06-27 DIAGNOSIS — C221 Intrahepatic bile duct carcinoma: Secondary | ICD-10-CM | POA: Diagnosis present

## 2020-06-27 DIAGNOSIS — D701 Agranulocytosis secondary to cancer chemotherapy: Secondary | ICD-10-CM | POA: Diagnosis not present

## 2020-06-27 NOTE — Progress Notes (Signed)
[place] OFFICE PROGRESS NOTE   Diagnosis: Cholangiocarcinoma  INTERVAL HISTORY:   [name] returns for an unscheduled visit.  She continues close follow-up with Dr. Fanny Skates for treatment of the hepatic cholangiocarcinoma.  She is currently receiving treatment with gemcitabine and FUDR via Dr. Fanny Skates.  Pruritus has improved.  No abdominal pain.  Good appetite.  She returns for discussion of systemic treatment options.  Objective:  Vital signs in last 24 hours:  Blood pressure 125/70, pulse 79, temperature 98.5 F (36.9 C), temperature source Oral, resp. rate 18, height _0  (1.626 m), weight 131 lb (59.4 kg), SpO2 100 %.    HEENT: Sclera anicteric Resp: Lungs clear bilaterally Cardio: Regular rate and rhythm GI: No hepatosplenomegaly, left abdomen hepatic infusion pump, no mass Vascular: No leg edema   Portacath/PICC-without erythema  Lab Results:  Lab Results  Component Value Date   WBC 4.9 12/02/2019   HGB 11.5 (L) 12/02/2019   HCT 36.6 12/02/2019   MCV 92.9 12/02/2019   PLT 272 12/02/2019   NEUTROABS 2.2 12/02/2019    CMP  Lab Results  Component Value Date   NA 138 12/02/2019   K 4.2 12/02/2019   CL 105 12/02/2019   CO2 27 12/02/2019   GLUCOSE 90 12/02/2019   BUN 16 12/02/2019   CREATININE 0.73 12/02/2019   CALCIUM 9.5 12/02/2019   PROT 6.8 12/02/2019   ALBUMIN 3.7 12/02/2019   AST 36 12/02/2019   ALT 179 (H) 12/02/2019   ALKPHOS 268 (H) 12/02/2019   BILITOT 0.3 12/02/2019   GFRNONAA >60 12/02/2019   GFRAA >60 05/13/2019     Medications: I have reviewed the patient's current medications.   Assessment/Plan: 1.  Intrahepatic cholangiocarcinoma ? Abdominal ultrasound 10/08/2018-heterogenous right hepatic mass ? MRI abdomen 10/14/2018-2 enhancing right liver lesions, 4.8 x 4.5 x 4.1 cm, more superior lesion measured 3 x 2 x 2.5 cm, multiple additional subcentimeter T2 hyperintense lesions-too small to characterize. ? CT chest  11/04/2018-6 mm left upper lobe groundglass nodule, no evidence of metastatic disease in the chest ? Biopsy of right liver lesion at [facility] 11/10/2018-moderately differentiated adenocarcinoma consistent with cholangiocarcinoma; IDH1 alteration; MSI stable; mismatch repair status proficient; tumor mutational burden low, 3; PD-L1 negative ? CTs at [facility] 12/12/2018-right hepatic lobe masses, satellite lesions adjacent to the dominant mass, prominent periportal and aortocaval lymph nodes prominent left supraclavicular node ? Cycle 1 gemcitabine/cisplatin at [facility] on 12/12/2018 ? Cycle 2 gemcitabine/cisplatin 01/02/2019, 01/09/2019 ? Cycle 3 gemcitabine/cisplatin 01/23/2019 ? CTs [facility] 02/02/2019-decrease in size of multiple hepatic masses, decreased size of prominent periportal and aortocaval nodes, unchanged prominent left supraclavicular node ? Cycle 4 gemcitabine/cisplatin 02/11/2019, Udenyca added and gemcitabine dose reduced with day 8 chemotherapy ? Cycle 5 gemcitabine/cisplatin 03/04/2019, gemcitabine reescalated to full dose ? Cycle 6 gemcitabine/cisplatin 03/25/2019 ? Cycle 7 gemcitabine/cisplatin 04/15/2019 ? Cycle 8 gemcitabine/cisplatin 05/06/2019 ? CT 05/18/2019-decreased size of multiple right hepatic masses ? 06/17/2019-hepatic arterial infusion pump placed, resection of hepatic artery/portal nodes, 1/7 portal nodes positive ? 07/10/2019-FUDR No. 1 ? 07/23/2019 gemcitabine 800 mg/m added ? 08/06/2019-FUDR No. 2, 55 mg (dose reduced due to mild increase in bilirubin (, gemcitabine 800 mg/m and oxaliplatin 68 mg/m ? 08/20/2019-treatment continued, Decadron and pump continue due to mild increase in alkaline phosphatase ? 09/17/2019, FUDR No. 3 at 28 mg (dose reduced due to continued increase in alkaline phosphatase) ? 10/01/2019, CTs with continued response to therapy, decrease in liver disease ? 10/01/2019, therapy continued ? 10/15/2019, therapy continue with  gemcitabine and oxaliplatin, FUDR  restarted at 55 mg ? 11/12/2019-alkaline phosphatase elevated, FUDR held, Decadron given in hepatic infusion flush ? 11/26/2019 chemotherapy held secondary to elevated liver enzymes ? 11/26/2019-CT with continued improvement in the hepatic tumor burden ? 12/10/2019-chemotherapy restarted with gemcitabine/oxaliplatin ? 12/24/2019 chemotherapy continued, FUDR 28 mg ? 02/04/2020-oxaliplatin held secondary to worsening neuropathy ? 03/02/2020-CTs-stable disease ? Treatment continued with FUDR and gemcitabine ? CTs 05/26/2020-unchanged treated lesions in the liver, unchanged left upper lobe pulmonary nodules, no evidence of disease progression ? 05/26/2020-recommendation to continue current therapy with gemcitabine plus FUDR (as labs allow) ? 06/09/2020-FUDR held ? 06/23/2020-FUDR held, continue gemcitabine, steroids given in HIA flush 2. Family history of multiple cancers eluding breast, pancreatic, melanoma, appendiceal cancer, lung cancer 3. Osteoporosis 4. Neutropenia secondary to chemotherapy, Udenyca added with day 8 cycle 4     Disposition: [name] appears stable.  There is no clinical or radiologic evidence of disease progression while on treatment with gemcitabine and hepatic FUDR.  The FUDR is currently on hold secondary to elevated liver enzymes.  She will return for a chemistry panel on 06/29/2020.  She is scheduled for follow-up at [facility] for gemcitabine and to consider resuming FUDR on 07/07/2020.  We discussed additional systemic treatment options including resuming oxaliplatin therapy, the addition of a PD-1 inhibitor, and treatment directed at the [facility] alteration.  She will discuss these options with Dr. Fanny Skates when indicated.  She is not scheduled for a follow-up office visit here.  I am available to see her as needed.    [name], MD  06/27/2020  2:01 PM

## 2020-06-29 ENCOUNTER — Inpatient Hospital Stay: Payer: No Typology Code available for payment source

## 2020-06-29 ENCOUNTER — Other Ambulatory Visit: Payer: Self-pay

## 2020-06-29 ENCOUNTER — Other Ambulatory Visit: Payer: 59

## 2020-06-29 ENCOUNTER — Ambulatory Visit: Payer: 59 | Admitting: Oncology

## 2020-06-29 DIAGNOSIS — C221 Intrahepatic bile duct carcinoma: Secondary | ICD-10-CM

## 2020-06-29 LAB — CMP (CANCER CENTER ONLY)
ALT: 37 U/L (ref 0–44)
AST: 20 U/L (ref 15–41)
Albumin: 4.3 g/dL (ref 3.5–5.0)
Alkaline Phosphatase: 233 U/L — ABNORMAL HIGH (ref 38–126)
Anion gap: 7 (ref 5–15)
BUN: 17 mg/dL (ref 8–23)
CO2: 27 mmol/L (ref 22–32)
Calcium: 9.4 mg/dL (ref 8.9–10.3)
Chloride: 104 mmol/L (ref 98–111)
Creatinine: 0.6 mg/dL (ref 0.44–1.00)
GFR, Estimated: >60 mL/min (ref >60)
Glucose, Bld: 88 mg/dL (ref 70–99)
Potassium: 4 mmol/L (ref 3.5–5.1)
Sodium: 138 mmol/L (ref 135–145)
Total Bilirubin: 0.5 mg/dL (ref 0.3–1.2)
Total Protein: 6.6 g/dL (ref 6.5–8.1)

## 2020-07-08 ENCOUNTER — Encounter: Payer: Self-pay | Admitting: Oncology

## 2020-07-11 ENCOUNTER — Other Ambulatory Visit: Payer: Self-pay | Admitting: Oncology

## 2020-07-11 DIAGNOSIS — C221 Intrahepatic bile duct carcinoma: Secondary | ICD-10-CM

## 2020-07-12 ENCOUNTER — Encounter: Payer: Self-pay | Admitting: *Deleted

## 2020-07-12 NOTE — Progress Notes (Signed)
Per Dr. Benay Spice: needs lab/flush/OV and Gemzar on 6/9 and lab/flush/Gemzar on 6/23. Scheduling message sent. Awaiting response from [employer] representative regarding what type of pump she has to determine if it can be flushed here.

## 2020-07-15 ENCOUNTER — Encounter: Payer: Self-pay | Admitting: *Deleted

## 2020-07-15 NOTE — Progress Notes (Signed)
Confirmed w/triage nurse at [facility] after consulting with Dr. Fanny Skates that patient has [employer] pump and reservoir volume is 20 ml saline with 25,000 units. Concentration is 5000 units/ml. Working on having product representative present for 1st flush here due on 6/23.

## 2020-07-21 ENCOUNTER — Other Ambulatory Visit: Payer: Self-pay

## 2020-07-21 ENCOUNTER — Inpatient Hospital Stay: Payer: No Typology Code available for payment source

## 2020-07-21 ENCOUNTER — Inpatient Hospital Stay (HOSPITAL_BASED_OUTPATIENT_CLINIC_OR_DEPARTMENT_OTHER): Payer: No Typology Code available for payment source | Admitting: Nurse Practitioner

## 2020-07-21 ENCOUNTER — Encounter: Payer: Self-pay | Admitting: Nurse Practitioner

## 2020-07-21 ENCOUNTER — Inpatient Hospital Stay: Payer: No Typology Code available for payment source | Attending: Oncology

## 2020-07-21 VITALS — BP 112/63 | HR 73 | Temp 97.8°F | Resp 18 | Ht 64.0 in | Wt 134.6 lb

## 2020-07-21 DIAGNOSIS — R748 Abnormal levels of other serum enzymes: Secondary | ICD-10-CM | POA: Insufficient documentation

## 2020-07-21 DIAGNOSIS — Z803 Family history of malignant neoplasm of breast: Secondary | ICD-10-CM | POA: Insufficient documentation

## 2020-07-21 DIAGNOSIS — Z801 Family history of malignant neoplasm of trachea, bronchus and lung: Secondary | ICD-10-CM | POA: Diagnosis not present

## 2020-07-21 DIAGNOSIS — Z5111 Encounter for antineoplastic chemotherapy: Secondary | ICD-10-CM | POA: Insufficient documentation

## 2020-07-21 DIAGNOSIS — C221 Intrahepatic bile duct carcinoma: Secondary | ICD-10-CM

## 2020-07-21 DIAGNOSIS — D701 Agranulocytosis secondary to cancer chemotherapy: Secondary | ICD-10-CM | POA: Diagnosis not present

## 2020-07-21 DIAGNOSIS — Z800 Family history of malignant neoplasm of digestive organs: Secondary | ICD-10-CM | POA: Diagnosis not present

## 2020-07-21 DIAGNOSIS — T451X5D Adverse effect of antineoplastic and immunosuppressive drugs, subsequent encounter: Secondary | ICD-10-CM | POA: Diagnosis not present

## 2020-07-21 DIAGNOSIS — Z808 Family history of malignant neoplasm of other organs or systems: Secondary | ICD-10-CM | POA: Diagnosis not present

## 2020-07-21 DIAGNOSIS — M810 Age-related osteoporosis without current pathological fracture: Secondary | ICD-10-CM | POA: Diagnosis not present

## 2020-07-21 LAB — CBC WITH DIFFERENTIAL (CANCER CENTER ONLY)
Abs Immature Granulocytes: 0.01 10*3/uL (ref 0.00–0.07)
Basophils Absolute: 0 10*3/uL (ref 0.0–0.1)
Basophils Relative: 1 %
Eosinophils Absolute: 0.1 10*3/uL (ref 0.0–0.5)
Eosinophils Relative: 2 %
HCT: 35.6 % — ABNORMAL LOW (ref 36.0–46.0)
Hemoglobin: 11.5 g/dL — ABNORMAL LOW (ref 12.0–15.0)
Immature Granulocytes: 0 %
Lymphocytes Relative: 31 %
Lymphs Abs: 1.3 10*3/uL (ref 0.7–4.0)
MCH: 29.9 pg (ref 26.0–34.0)
MCHC: 32.3 g/dL (ref 30.0–36.0)
MCV: 92.7 fL (ref 80.0–100.0)
Monocytes Absolute: 0.4 10*3/uL (ref 0.1–1.0)
Monocytes Relative: 10 %
Neutro Abs: 2.3 10*3/uL (ref 1.7–7.7)
Neutrophils Relative %: 56 %
Platelet Count: 202 10*3/uL (ref 150–400)
RBC: 3.84 MIL/uL — ABNORMAL LOW (ref 3.87–5.11)
RDW: 15.4 % (ref 11.5–15.5)
WBC Count: 4.1 10*3/uL (ref 4.0–10.5)
nRBC: 0 % (ref 0.0–0.2)

## 2020-07-21 LAB — CMP (CANCER CENTER ONLY)
ALT: 15 U/L (ref 0–44)
AST: 20 U/L (ref 15–41)
Albumin: 4.1 g/dL (ref 3.5–5.0)
Alkaline Phosphatase: 142 U/L — ABNORMAL HIGH (ref 38–126)
Anion gap: 8 (ref 5–15)
BUN: 19 mg/dL (ref 8–23)
CO2: 25 mmol/L (ref 22–32)
Calcium: 9.4 mg/dL (ref 8.9–10.3)
Chloride: 106 mmol/L (ref 98–111)
Creatinine: 0.74 mg/dL (ref 0.44–1.00)
GFR, Estimated: >60 mL/min (ref >60)
Glucose, Bld: 113 mg/dL — ABNORMAL HIGH (ref 70–99)
Potassium: 4 mmol/L (ref 3.5–5.1)
Sodium: 139 mmol/L (ref 135–145)
Total Bilirubin: 0.6 mg/dL (ref 0.3–1.2)
Total Protein: 6.6 g/dL (ref 6.5–8.1)

## 2020-07-21 MED ORDER — HEPARIN SOD (PORK) LOCK FLUSH 100 UNIT/ML IV SOLN
500.0000 [IU] | Freq: Once | INTRAVENOUS | Status: AC | PRN
  Administered 2020-07-21: 500 [IU]
  Filled 2020-07-21: qty 5

## 2020-07-21 MED ORDER — SODIUM CHLORIDE 0.9% FLUSH
10.0000 mL | INTRAVENOUS | Status: DC | PRN
  Administered 2020-07-21: 10 mL
  Filled 2020-07-21: qty 10

## 2020-07-21 MED ORDER — SODIUM CHLORIDE 0.9 % IV SOLN
Freq: Once | INTRAVENOUS | Status: AC
  Filled 2020-07-21: qty 250

## 2020-07-21 MED ORDER — SODIUM CHLORIDE 0.9 % IV SOLN
800.0000 mg/m2 | Freq: Once | INTRAVENOUS | Status: AC
  Administered 2020-07-21: 1330 mg via INTRAVENOUS
  Filled 2020-07-21: qty 26.3

## 2020-07-21 MED ORDER — LIDOCAINE-PRILOCAINE 2.5-2.5 % EX CREA: 1.0000 "application " | TOPICAL_CREAM | CUTANEOUS | 1 refills | Status: DC

## 2020-07-21 MED ORDER — DEXAMETHASONE 4 MG PO TABS
12.0000 mg | ORAL_TABLET | Freq: Once | ORAL | Status: AC
  Administered 2020-07-21: 12 mg via ORAL
  Filled 2020-07-21: qty 3

## 2020-07-21 NOTE — Progress Notes (Signed)
Routed CBC/CMP results via Epic from today to Dr. Karmen Stabs at [facility] w/ message that patient asking if she can receive the [facility] now? She is also agreeable to EMLA cream for next access of [employer] pump.

## 2020-07-21 NOTE — Patient Instructions (Signed)
Angela Fleming  Discharge Instructions: Thank you for choosing [facility] to provide your oncology and hematology care.   If you have a lab appointment with the [facility], please go directly to the [facility] and check in at the registration area.   Wear comfortable clothing and clothing appropriate for easy access to any Portacath or PICC line.   We strive to give you quality time with your provider. You may need to reschedule your appointment if you arrive late (15 or more minutes).  Arriving late affects you and other patients whose appointments are after yours.  Also, if you miss three or more appointments without notifying the office, you may be dismissed from the clinic at the provider's discretion.      For prescription refill requests, have your pharmacy contact our office and allow 72 hours for refills to be completed.    Today you received the following chemotherapy and/or immunotherapy agents  gemcitabine   To help prevent nausea and vomiting after your treatment, we encourage you to take your nausea medication as directed.  BELOW ARE SYMPTOMS THAT SHOULD BE REPORTED IMMEDIATELY: *FEVER GREATER THAN 100.4 F (38 C) OR HIGHER *CHILLS OR SWEATING *NAUSEA AND VOMITING THAT IS NOT CONTROLLED WITH YOUR NAUSEA MEDICATION *UNUSUAL SHORTNESS OF BREATH *UNUSUAL BRUISING OR BLEEDING *URINARY PROBLEMS (pain or burning when urinating, or frequent urination) *BOWEL PROBLEMS (unusual diarrhea, constipation, pain near the anus) TENDERNESS IN MOUTH AND THROAT WITH OR WITHOUT PRESENCE OF ULCERS (sore throat, sores in mouth, or a toothache) UNUSUAL RASH, SWELLING OR PAIN  UNUSUAL VAGINAL DISCHARGE OR ITCHING   Items with * indicate a potential emergency and should be followed up as soon as possible or go to the Emergency Department if any problems should occur.  Please show the CHEMOTHERAPY ALERT CARD or IMMUNOTHERAPY ALERT CARD at check-in to the  Emergency Department and triage nurse.  Should you have questions after your visit or need to cancel or reschedule your appointment, please contact [facility]  Dept: [phone]  and follow the prompts.  Office hours are 8:00 a.m. to 4:30 p.m. Monday - Friday. Please note that voicemails left after 4:00 p.m. may not be returned until the following business day.  We are closed weekends and major holidays. You have access to a nurse at all times for urgent questions. Please call the main number to the clinic Dept: [phone] and follow the prompts.   For any non-urgent questions, you may also contact your provider using MyChart. We now offer e-Visits for anyone 41 and older to request care online for non-urgent symptoms. For details visit [email or web address].   Also download the MyChart app! Go to the app store, search "MyChart", open the app, select [facility], and log in with your MyChart username and password.  Due to Covid, a mask is required upon entering the hospital/clinic. If you do not have a mask, one will be given to you upon arrival. For doctor visits, patients may have 1 support person aged 33 or older with them. For treatment visits, patients cannot have anyone with them due to current Covid guidelines and our immunocompromised population.   Gemcitabine injection What is this medicine? GEMCITABINE (jem SYE ta been) is a chemotherapy drug. This medicine is used to treat many types of cancer like breast cancer, lung cancer, pancreatic cancer, and ovarian cancer. This medicine may be used for other purposes; ask your health care provider or pharmacist if  you have questions. COMMON BRAND NAME(S): Gemzar, Infugem What should I tell my health care provider before I take this medicine? They need to know if you have any of these conditions: blood disorders infection kidney disease liver disease lung or breathing disease, like asthma recent or ongoing  radiation therapy an unusual or allergic reaction to gemcitabine, other chemotherapy, other medicines, foods, dyes, or preservatives pregnant or trying to get pregnant breast-feeding How should I use this medicine? This drug is given as an infusion into a vein. It is administered in a hospital or clinic by a specially trained health care professional. Talk to your pediatrician regarding the use of this medicine in children. Special care may be needed. Overdosage: If you think you have taken too much of this medicine contact a poison control center or emergency room at once. NOTE: This medicine is only for you. Do not share this medicine with others. What if I miss a dose? It is important not to miss your dose. Call your doctor or health care professional if you are unable to keep an appointment. What may interact with this medicine? medicines to increase blood counts like filgrastim, pegfilgrastim, sargramostim some other chemotherapy drugs like cisplatin vaccines Talk to your doctor or health care professional before taking any of these medicines: acetaminophen aspirin ibuprofen ketoprofen naproxen This list may not describe all possible interactions. Give your health care provider a list of all the medicines, herbs, non-prescription drugs, or dietary supplements you use. Also tell them if you smoke, drink alcohol, or use illegal drugs. Some items may interact with your medicine. What should I watch for while using this medicine? Visit your doctor for checks on your progress. This drug may make you feel generally unwell. This is not uncommon, as chemotherapy can affect healthy cells as well as cancer cells. Report any side effects. Continue your course of treatment even though you feel ill unless your doctor tells you to stop. In some cases, you may be given additional medicines to help with side effects. Follow all directions for their use. Call your doctor or health care professional for  advice if you get a fever, chills or sore throat, or other symptoms of a cold or flu. Do not treat yourself. This drug decreases your body's ability to fight infections. Try to avoid being around people who are sick. This medicine may increase your risk to bruise or bleed. Call your doctor or health care professional if you notice any unusual bleeding. Be careful brushing and flossing your teeth or using a toothpick because you may get an infection or bleed more easily. If you have any dental work done, tell your dentist you are receiving this medicine. Avoid taking products that contain aspirin, acetaminophen, ibuprofen, naproxen, or ketoprofen unless instructed by your doctor. These medicines may hide a fever. Do not become pregnant while taking this medicine or for 6 months after stopping it. Women should inform their doctor if they wish to become pregnant or think they might be pregnant. Men should not father a child while taking this medicine and for 3 months after stopping it. There is a potential for serious side effects to an unborn child. Talk to your health care professional or pharmacist for more information. Do not breast-feed an infant while taking this medicine or for at least 1 week after stopping it. Men should inform their doctors if they wish to father a child. This medicine may lower sperm counts. Talk with your doctor or health care professional  if you are concerned about your fertility. What side effects may I notice from receiving this medicine? Side effects that you should report to your doctor or health care professional as soon as possible: allergic reactions like skin rash, itching or hives, swelling of the face, lips, or tongue breathing problems pain, redness, or irritation at site where injected signs and symptoms of a dangerous change in heartbeat or heart rhythm like chest pain; dizziness; fast or irregular heartbeat; palpitations; feeling faint or lightheaded, falls;  breathing problems signs of decreased platelets or bleeding - bruising, pinpoint red spots on the skin, black, tarry stools, blood in the urine signs of decreased red blood cells - unusually weak or tired, feeling faint or lightheaded, falls signs of infection - fever or chills, cough, sore throat, pain or difficulty passing urine signs and symptoms of kidney injury like trouble passing urine or change in the amount of urine signs and symptoms of liver injury like dark yellow or brown urine; general ill feeling or flu-like symptoms; light-colored stools; loss of appetite; nausea; right upper belly pain; unusually weak or tired; yellowing of the eyes or skin swelling of ankles, feet, hands Side effects that usually do not require medical attention (report to your doctor or health care professional if they continue or are bothersome): constipation diarrhea hair loss loss of appetite nausea rash vomiting This list may not describe all possible side effects. Call your doctor for medical advice about side effects. You may report side effects to FDA at 1-800-FDA-1088. Where should I keep my medicine? This drug is given in a hospital or clinic and will not be stored at home. NOTE: This sheet is a summary. It may not cover all possible information. If you have questions about this medicine, talk to your doctor, pharmacist, or health care provider.  2021 Elsevier/Gold Standard (2017-04-24 18:06:11)

## 2020-07-21 NOTE — Progress Notes (Signed)
[facility] OFFICE PROGRESS NOTE   Diagnosis: Cholangiocarcinoma  INTERVAL HISTORY:   [name] returns for follow-up.  She continues every 2-week Gemcitabine, most recent treatment at [facility] on 07/07/2020.  FUDR remains on hold due to elevated liver enzymes.  She is transitioning her care back to [facility].  She feels she is tolerating Gemcitabine well.  No nausea or vomiting.  No mouth sores.  No diarrhea.  No fever or chills following treatment.  No rash.   Objective:  Vital signs in last 24 hours:  Blood pressure 112/63, pulse 73, temperature 97.8 F (36.6 C), temperature source Oral, resp. rate 18, height $RemoveBe'5\' 4"'jpswlVMTp$  (1.626 m), weight 134 lb 9.6 oz (61.1 kg), SpO2 100 %.    HEENT: No thrush or ulcers. Resp: Lungs clear bilaterally. Cardio: Regular rate and rhythm. GI: Abdomen soft and nontender.  No hepatomegaly.  Left abdomen hepatic infusion pump. Vascular: No leg edema. Skin: No rash. Port-A-Cath without erythema.   Lab Results:  Lab Results  Component Value Date   WBC 4.1 07/21/2020   HGB 11.5 (L) 07/21/2020   HCT 35.6 (L) 07/21/2020   MCV 92.7 07/21/2020   PLT 202 07/21/2020   NEUTROABS 2.3 07/21/2020    Imaging:  No results found.  Medications: I have reviewed the patient's current medications.  Assessment/Plan:  Intrahepatic cholangiocarcinoma Abdominal ultrasound 10/08/2018-heterogenous right hepatic mass MRI abdomen 10/14/2018-2 enhancing right liver lesions, 4.8 x 4.5 x 4.1 cm, more superior lesion measured 3 x 2 x 2.5 cm, multiple additional subcentimeter T2 hyperintense lesions-too small to characterize. CT chest 11/04/2018-6 mm left upper lobe groundglass nodule, no evidence of metastatic disease in the chest Biopsy of right liver lesion at [facility] 11/10/2018-moderately differentiated adenocarcinoma consistent with cholangiocarcinoma; IDH1 alteration; MSI stable; mismatch repair status proficient; tumor mutational burden low, 3; PD-L1  negative CTs at [facility] 12/12/2018-right hepatic lobe masses, satellite lesions adjacent to the dominant mass, prominent periportal and aortocaval lymph nodes prominent left supraclavicular node Cycle 1 gemcitabine/cisplatin at [facility] on 12/12/2018 Cycle 2 gemcitabine/cisplatin 01/02/2019, 01/09/2019 Cycle 3 gemcitabine/cisplatin 01/23/2019 CTs at [facility] 02/02/2019-decrease in size of multiple hepatic masses, decreased size of prominent periportal and aortocaval nodes, unchanged prominent left supraclavicular node Cycle 4 gemcitabine/cisplatin 02/11/2019, Udenyca added and gemcitabine dose reduced with day 8 chemotherapy Cycle 5 gemcitabine/cisplatin 03/04/2019, gemcitabine reescalated to full dose Cycle 6 gemcitabine/cisplatin 03/25/2019 Cycle 7 gemcitabine/cisplatin 04/15/2019 Cycle 8 gemcitabine/cisplatin 05/06/2019 CT 05/18/2019-decreased size of multiple right hepatic masses 06/17/2019-hepatic arterial infusion pump placed, resection of hepatic artery/portal nodes, 1/7 portal nodes positive 07/10/2019-FUDR No. 1 07/23/2019 gemcitabine 800 mg/m added 08/06/2019-FUDR No. 2, 55 mg (dose reduced due to mild increase in bilirubin (, gemcitabine 800 mg/m and oxaliplatin 68 mg/m 08/20/2019-treatment continued, Decadron and pump continue due to mild increase in alkaline phosphatase 09/17/2019, FUDR No. 3 at 28 mg (dose reduced due to continued increase in alkaline phosphatase) 10/01/2019, CTs with continued response to therapy, decrease in liver disease 10/01/2019, therapy continued 10/15/2019, therapy continue with gemcitabine and oxaliplatin, FUDR restarted at 55 mg 11/12/2019-alkaline phosphatase elevated, FUDR held, Decadron given in hepatic infusion flush 11/26/2019 chemotherapy held secondary to elevated liver enzymes 11/26/2019-CT with continued improvement in the hepatic tumor burden 12/10/2019-chemotherapy restarted with gemcitabine/oxaliplatin 12/24/2019 chemotherapy continued, FUDR 28  mg 02/04/2020-oxaliplatin held secondary to worsening neuropathy 03/02/2020-CTs-stable disease Treatment continued with FUDR and gemcitabine CTs 05/26/2020-unchanged treated lesions in the liver, unchanged left upper lobe pulmonary nodules, no evidence of disease progression 05/26/2020-recommendation to continue current therapy with gemcitabine plus FUDR (  as labs allow) 06/09/2020-FUDR held 06/23/2020-FUDR held, continue gemcitabine, steroids given in HIA flush 07/21/2020 every 2-week Gemcitabine Family history of multiple cancers eluding breast, pancreatic, melanoma, appendiceal cancer, lung cancer Osteoporosis Neutropenia secondary to chemotherapy, Udenyca added with day 8 cycle 4        Disposition: Angela Fleming appears stable.  She is currently on active treatment with gemcitabine every 2 weeks.  FUDR on hold due to elevated liver enzymes.  She is tolerating Gemcitabine well.  Plan to proceed with treatment today as scheduled.  We reviewed the CBC and chemistry panel from today.  Labs adequate to proceed with treatment.  Alkaline phosphatase is mildly elevated.  We will forward a copy of today's labs to Dr. Fanny Skates at [facility].  She will return for lab, follow-up, Gemcitabine in 2 weeks.  She will contact the office in the interim with any problems.    Ned Card ANP/GNP-BC   07/21/2020  1:23 PM

## 2020-07-30 ENCOUNTER — Other Ambulatory Visit: Payer: Self-pay | Admitting: Oncology

## 2020-08-02 ENCOUNTER — Telehealth: Payer: Self-pay | Admitting: *Deleted

## 2020-08-02 NOTE — Telephone Encounter (Signed)
Called patient to confirm that [employer] rep will be here on 6/23 to help load her into system here and advise on procedure. She will put on EMLA cream at about 7 am.

## 2020-08-04 ENCOUNTER — Inpatient Hospital Stay: Payer: No Typology Code available for payment source

## 2020-08-04 ENCOUNTER — Ambulatory Visit: Payer: 59

## 2020-08-04 ENCOUNTER — Other Ambulatory Visit: Payer: Self-pay

## 2020-08-04 ENCOUNTER — Other Ambulatory Visit: Payer: 59

## 2020-08-04 ENCOUNTER — Inpatient Hospital Stay (HOSPITAL_BASED_OUTPATIENT_CLINIC_OR_DEPARTMENT_OTHER): Payer: No Typology Code available for payment source | Admitting: Oncology

## 2020-08-04 VITALS — BP 118/70 | HR 81 | Temp 97.6°F | Resp 19 | Ht 64.0 in | Wt 137.6 lb

## 2020-08-04 DIAGNOSIS — C221 Intrahepatic bile duct carcinoma: Secondary | ICD-10-CM

## 2020-08-04 DIAGNOSIS — Z5111 Encounter for antineoplastic chemotherapy: Secondary | ICD-10-CM | POA: Diagnosis not present

## 2020-08-04 DIAGNOSIS — Z95828 Presence of other vascular implants and grafts: Secondary | ICD-10-CM

## 2020-08-04 LAB — CBC WITH DIFFERENTIAL (CANCER CENTER ONLY)
Abs Immature Granulocytes: 0.01 10*3/uL (ref 0.00–0.07)
Basophils Absolute: 0 10*3/uL (ref 0.0–0.1)
Basophils Relative: 1 %
Eosinophils Absolute: 0.1 10*3/uL (ref 0.0–0.5)
Eosinophils Relative: 2 %
HCT: 35.9 % — ABNORMAL LOW (ref 36.0–46.0)
Hemoglobin: 11.7 g/dL — ABNORMAL LOW (ref 12.0–15.0)
Immature Granulocytes: 0 %
Lymphocytes Relative: 42 %
Lymphs Abs: 1.7 10*3/uL (ref 0.7–4.0)
MCH: 30.1 pg (ref 26.0–34.0)
MCHC: 32.6 g/dL (ref 30.0–36.0)
MCV: 92.3 fL (ref 80.0–100.0)
Monocytes Absolute: 0.6 10*3/uL (ref 0.1–1.0)
Monocytes Relative: 14 %
Neutro Abs: 1.7 10*3/uL (ref 1.7–7.7)
Neutrophils Relative %: 41 %
Platelet Count: 186 10*3/uL (ref 150–400)
RBC: 3.89 MIL/uL (ref 3.87–5.11)
RDW: 15 % (ref 11.5–15.5)
WBC Count: 4.1 10*3/uL (ref 4.0–10.5)
nRBC: 0 % (ref 0.0–0.2)

## 2020-08-04 LAB — CMP (CANCER CENTER ONLY)
ALT: 64 U/L — ABNORMAL HIGH (ref 0–44)
AST: 39 U/L (ref 15–41)
Albumin: 4.2 g/dL (ref 3.5–5.0)
Alkaline Phosphatase: 236 U/L — ABNORMAL HIGH (ref 38–126)
Anion gap: 7 (ref 5–15)
BUN: 20 mg/dL (ref 8–23)
CO2: 27 mmol/L (ref 22–32)
Calcium: 9.4 mg/dL (ref 8.9–10.3)
Chloride: 104 mmol/L (ref 98–111)
Creatinine: 0.62 mg/dL (ref 0.44–1.00)
GFR, Estimated: >60 mL/min (ref >60)
Glucose, Bld: 99 mg/dL (ref 70–99)
Potassium: 4.1 mmol/L (ref 3.5–5.1)
Sodium: 138 mmol/L (ref 135–145)
Total Bilirubin: 0.5 mg/dL (ref 0.3–1.2)
Total Protein: 6.3 g/dL — ABNORMAL LOW (ref 6.5–8.1)

## 2020-08-04 MED ORDER — SODIUM CHLORIDE 0.9% FLUSH
10.0000 mL | INTRAVENOUS | Status: DC | PRN
  Filled 2020-08-04: qty 10

## 2020-08-04 MED ORDER — ALTEPLASE 2 MG IJ SOLR
2.0000 mg | Freq: Once | INTRAMUSCULAR | Status: DC | PRN
  Filled 2020-08-04: qty 2

## 2020-08-04 MED ORDER — HEPARIN SOD (PORK) LOCK FLUSH 100 UNIT/ML IV SOLN
500.0000 [IU] | Freq: Once | INTRAVENOUS | Status: DC | PRN
  Filled 2020-08-04: qty 5

## 2020-08-04 MED ORDER — DEXAMETHASONE 4 MG PO TABS
12.0000 mg | ORAL_TABLET | Freq: Once | ORAL | Status: AC
  Administered 2020-08-04: 12 mg via ORAL
  Filled 2020-08-04: qty 3

## 2020-08-04 MED ORDER — SODIUM CHLORIDE (PF) 0.9 % IJ SOLN
Freq: Once | INTRAMUSCULAR | Status: AC
Start: 2020-08-04 — End: 2020-08-04
  Filled 2020-08-04: qty 5

## 2020-08-04 MED ORDER — SODIUM CHLORIDE 0.9 % IV SOLN
Freq: Once | INTRAVENOUS | Status: AC
  Filled 2020-08-04: qty 250

## 2020-08-04 MED ORDER — HEPARIN SOD (PORK) LOCK FLUSH 100 UNIT/ML IV SOLN
500.0000 [IU] | Freq: Once | INTRAVENOUS | Status: AC | PRN
Start: 2020-08-04 — End: 2020-08-04
  Administered 2020-08-04: 500 [IU]
  Filled 2020-08-04: qty 5

## 2020-08-04 MED ORDER — SODIUM CHLORIDE 0.9% FLUSH
10.0000 mL | INTRAVENOUS | Status: DC | PRN
  Administered 2020-08-04: 10 mL
  Filled 2020-08-04: qty 10

## 2020-08-04 MED ORDER — SODIUM CHLORIDE 0.9 % IV SOLN
800.0000 mg/m2 | Freq: Once | INTRAVENOUS | Status: AC
  Administered 2020-08-04: 1330 mg via INTRAVENOUS
  Filled 2020-08-04: qty 10.52

## 2020-08-04 NOTE — Patient Instructions (Signed)
[facility]   Discharge Instructions: Thank you for choosing [facility] to provide your oncology and hematology care.   If you have a lab appointment with the [facility], please go directly to the [facility] and check in at the registration area.   Wear comfortable clothing and clothing appropriate for easy access to any Portacath or PICC line.   We strive to give you quality time with your provider. You may need to reschedule your appointment if you arrive late (15 or more minutes).  Arriving late affects you and other patients whose appointments are after yours.  Also, if you miss three or more appointments without notifying the office, you may be dismissed from the clinic at the provider's discretion.      For prescription refill requests, have your pharmacy contact our office and allow 72 hours for refills to be completed.    Today you received the following chemotherapy and/or immunotherapy agents Gemcitabine (GEMZAR).      To help prevent nausea and vomiting after your treatment, we encourage you to take your nausea medication as directed.  BELOW ARE SYMPTOMS THAT SHOULD BE REPORTED IMMEDIATELY: *FEVER GREATER THAN 100.4 F (38 C) OR HIGHER *CHILLS OR SWEATING *NAUSEA AND VOMITING THAT IS NOT CONTROLLED WITH YOUR NAUSEA MEDICATION *UNUSUAL SHORTNESS OF BREATH *UNUSUAL BRUISING OR BLEEDING *URINARY PROBLEMS (pain or burning when urinating, or frequent urination) *BOWEL PROBLEMS (unusual diarrhea, constipation, pain near the anus) TENDERNESS IN MOUTH AND THROAT WITH OR WITHOUT PRESENCE OF ULCERS (sore throat, sores in mouth, or a toothache) UNUSUAL RASH, SWELLING OR PAIN  UNUSUAL VAGINAL DISCHARGE OR ITCHING   Items with * indicate a potential emergency and should be followed up as soon as possible or go to the Emergency Department if any problems should occur.  Please show the CHEMOTHERAPY ALERT CARD or IMMUNOTHERAPY ALERT CARD at  check-in to the Emergency Department and triage nurse.  Should you have questions after your visit or need to cancel or reschedule your appointment, please contact [facility]  Dept: [phone]  and follow the prompts.  Office hours are 8:00 a.m. to 4:30 p.m. Monday - Friday. Please note that voicemails left after 4:00 p.m. may not be returned until the following business day.  We are closed weekends and major holidays. You have access to a nurse at all times for urgent questions. Please call the main number to the clinic Dept: [phone] and follow the prompts.   For any non-urgent questions, you may also contact your provider using MyChart. We now offer e-Visits for anyone 39 and older to request care online for non-urgent symptoms. For details visit [email or web address].   Also download the MyChart app! Go to the app store, search "MyChart", open the app, select [facility], and log in with your MyChart username and password.  Due to Covid, a mask is required upon entering the hospital/clinic. If you do not have a mask, one will be given to you upon arrival. For doctor visits, patients may have 1 support person aged 40 or older with them. For treatment visits, patients cannot have anyone with them due to current Covid guidelines and our immunocompromised population.   Gemcitabine injection What is this medication? GEMCITABINE (jem SYE ta been) is a chemotherapy drug. This medicine is used to treat many types of cancer like breast cancer, lung cancer, pancreatic cancer,and ovarian cancer. This medicine may be used for other purposes; ask your health care provider  orpharmacist if you have questions. COMMON BRAND NAME(S): Gemzar, Infugem What should I tell my care team before I take this medication? They need to know if you have any of these conditions: blood disorders infection kidney disease liver disease lung or breathing disease, like asthma recent or  ongoing radiation therapy an unusual or allergic reaction to gemcitabine, other chemotherapy, other medicines, foods, dyes, or preservatives pregnant or trying to get pregnant breast-feeding How should I use this medication? This drug is given as an infusion into a vein. It is administered in a hospitalor clinic by a specially trained health care professional. Talk to your pediatrician regarding the use of this medicine in children.Special care may be needed. Overdosage: If you think you have taken too much of this medicine contact apoison control center or emergency room at once. NOTE: This medicine is only for you. Do not share this medicine with others. What if I miss a dose? It is important not to miss your dose. Call your doctor or health careprofessional if you are unable to keep an appointment. What may interact with this medication? medicines to increase blood counts like filgrastim, pegfilgrastim, sargramostim some other chemotherapy drugs like cisplatin vaccines Talk to your doctor or health care professional before taking any of thesemedicines: acetaminophen aspirin ibuprofen ketoprofen naproxen This list may not describe all possible interactions. Give your health care provider a list of all the medicines, herbs, non-prescription drugs, or dietary supplements you use. Also tell them if you smoke, drink alcohol, or use illegaldrugs. Some items may interact with your medicine. What should I watch for while using this medication? Visit your doctor for checks on your progress. This drug may make you feel generally unwell. This is not uncommon, as chemotherapy can affect healthy cells as well as cancer cells. Report any side effects. Continue your course oftreatment even though you feel ill unless your doctor tells you to stop. In some cases, you may be given additional medicines to help with side effects.Follow all directions for their use. Call your doctor or health care  professional for advice if you get a fever, chills or sore throat, or other symptoms of a cold or flu. Do not treat yourself. This drug decreases your body's ability to fight infections. Try toavoid being around people who are sick. This medicine may increase your risk to bruise or bleed. Call your doctor orhealth care professional if you notice any unusual bleeding. Be careful brushing and flossing your teeth or using a toothpick because you may get an infection or bleed more easily. If you have any dental work done,tell your dentist you are receiving this medicine. Avoid taking products that contain aspirin, acetaminophen, ibuprofen, naproxen, or ketoprofen unless instructed by your doctor. These medicines may hide afever. Do not become pregnant while taking this medicine or for 6 months after stopping it. Women should inform their doctor if they wish to become pregnant or think they might be pregnant. Men should not father a child while taking this medicine and for 3 months after stopping it. There is a potential for serious side effects to an unborn child. Talk to your health care professional or pharmacist for more information. Do not breast-feed an infant while takingthis medicine or for at least 1 week after stopping it. Men should inform their doctors if they wish to father a child. This medicine may lower sperm counts. Talk with your doctor or health care professional ifyou are concerned about your fertility. What side effects may I notice  from receiving this medication? Side effects that you should report to your doctor or health care professionalas soon as possible: allergic reactions like skin rash, itching or hives, swelling of the face, lips, or tongue breathing problems pain, redness, or irritation at site where injected signs and symptoms of a dangerous change in heartbeat or heart rhythm like chest pain; dizziness; fast or irregular heartbeat; palpitations; feeling faint or lightheaded,  falls; breathing problems signs of decreased platelets or bleeding - bruising, pinpoint red spots on the skin, black, tarry stools, blood in the urine signs of decreased red blood cells - unusually weak or tired, feeling faint or lightheaded, falls signs of infection - fever or chills, cough, sore throat, pain or difficulty passing urine signs and symptoms of kidney injury like trouble passing urine or change in the amount of urine signs and symptoms of liver injury like dark yellow or brown urine; general ill feeling or flu-like symptoms; light-colored stools; loss of appetite; nausea; right upper belly pain; unusually weak or tired; yellowing of the eyes or skin swelling of ankles, feet, hands Side effects that usually do not require medical attention (report to yourdoctor or health care professional if they continue or are bothersome): constipation diarrhea hair loss loss of appetite nausea rash vomiting This list may not describe all possible side effects. Call your doctor for medical advice about side effects. You may report side effects to FDA at1-800-FDA-1088. Where should I keep my medication? This drug is given in a hospital or clinic and will not be stored at home. NOTE: This sheet is a summary. It may not cover all possible information. If you have questions about this medicine, talk to your doctor, pharmacist, orhealth care provider.  2022 Elsevier/Gold Standard (2017-04-24 18:06:11)

## 2020-08-04 NOTE — Progress Notes (Signed)
Scanned Medtronic pump left abdomen with 3.4 ml residual in pump. Accessed medtronic pump using sterile technique and aspirated 3.4 ml. Instilled heparin 25,000 ml in 20 ml NS slowly and aspirated every 3 ml to confirm continued placement. Needle removed w/no bleeding. Tolerated procedure well. I-pad updated with 20 ml volume and she will return on 09/01/20 for next refill (due on 09/02/20 per Ipad). Medtronic representative present to confirm Ipad recognizes new patient to [facility].

## 2020-08-04 NOTE — Progress Notes (Signed)
[facility] OFFICE PROGRESS NOTE   Diagnosis: Cholangiocarcinoma  INTERVAL HISTORY:   Angela Fleming returns as scheduled.  She completed another treatment with gemcitabine on 07/21/2020.  No rash or fever.  She feels well.  She is scheduled for restaging CTs at [facility] in 2 weeks.  Objective:  Vital signs in last 24 hours:  Blood pressure 118/70, pulse 81, temperature 97.6 F (36.4 C), temperature source Oral, resp. rate 19, height 5' 4"  (1.626 m), weight 137 lb 9.6 oz (62.4 kg), SpO2 100 %.    HEENT: No thrush or ulcers Resp: Lungs clear bilaterally Cardio: Regular rate and rhythm GI: No hepatomegaly, nontender, left lower quadrant infusion pump Vascular: No leg edema  Portacath/PICC-without erythema  Lab Results:  Lab Results  Component Value Date   WBC 4.1 08/04/2020   HGB 11.7 (L) 08/04/2020   HCT 35.9 (L) 08/04/2020   MCV 92.3 08/04/2020   PLT 186 08/04/2020   NEUTROABS 1.7 08/04/2020    CMP  Lab Results  Component Value Date   NA 139 07/21/2020   K 4.0 07/21/2020   CL 106 07/21/2020   CO2 25 07/21/2020   GLUCOSE 113 (H) 07/21/2020   BUN 19 07/21/2020   CREATININE 0.74 07/21/2020   CALCIUM 9.4 07/21/2020   PROT 6.6 07/21/2020   ALBUMIN 4.1 07/21/2020   AST 20 07/21/2020   ALT 15 07/21/2020   ALKPHOS 142 (H) 07/21/2020   BILITOT 0.6 07/21/2020   GFRNONAA >60 07/21/2020   GFRAA >60 05/13/2019    Lab Results  Component Value Date   CEA1 1.02 01/02/2019    Medications: I have reviewed the patient's current medications.   Assessment/Plan:  Intrahepatic cholangiocarcinoma Abdominal ultrasound 10/08/2018-heterogenous right hepatic mass MRI abdomen 10/14/2018-2 enhancing right liver lesions, 4.8 x 4.5 x 4.1 cm, more superior lesion measured 3 x 2 x 2.5 cm, multiple additional subcentimeter T2 hyperintense lesions-too small to characterize. CT chest 11/04/2018-6 mm left upper lobe groundglass nodule, no evidence of metastatic disease in the  chest Biopsy of right liver lesion at [facility] 11/10/2018-moderately differentiated adenocarcinoma consistent with cholangiocarcinoma; IDH1 alteration; MSI stable; mismatch repair status proficient; tumor mutational burden low, 3; PD-L1 negative CTs at [facility] 12/12/2018-right hepatic lobe masses, satellite lesions adjacent to the dominant mass, prominent periportal and aortocaval lymph nodes prominent left supraclavicular node Cycle 1 gemcitabine/cisplatin at [place] on 12/12/2018 Cycle 2 gemcitabine/cisplatin 01/02/2019, 01/09/2019 Cycle 3 gemcitabine/cisplatin 01/23/2019 CTs at [facility] 02/02/2019-decrease in size of multiple hepatic masses, decreased size of prominent periportal and aortocaval nodes, unchanged prominent left supraclavicular node Cycle 4 gemcitabine/cisplatin 02/11/2019, Udenyca added and gemcitabine dose reduced with day 8 chemotherapy Cycle 5 gemcitabine/cisplatin 03/04/2019, gemcitabine reescalated to full dose Cycle 6 gemcitabine/cisplatin 03/25/2019 Cycle 7 gemcitabine/cisplatin 04/15/2019 Cycle 8 gemcitabine/cisplatin 05/06/2019 CT 05/18/2019-decreased size of multiple right hepatic masses 06/17/2019-hepatic arterial infusion pump placed, resection of hepatic artery/portal nodes, 1/7 portal nodes positive 07/10/2019-FUDR No. 1 07/23/2019 gemcitabine 800 mg/m added 08/06/2019-FUDR No. 2, 55 mg (dose reduced due to mild increase in bilirubin (, gemcitabine 800 mg/m and oxaliplatin 68 mg/m 08/20/2019-treatment continued, Decadron and pump continue due to mild increase in alkaline phosphatase 09/17/2019, FUDR No. 3 at 28 mg (dose reduced due to continued increase in alkaline phosphatase) 10/01/2019, CTs with continued response to therapy, decrease in liver disease 10/01/2019, therapy continued 10/15/2019, therapy continue with gemcitabine and oxaliplatin, FUDR restarted at 55 mg 11/12/2019-alkaline phosphatase elevated, FUDR held, Decadron given in hepatic infusion flush 11/26/2019 chemotherapy  held secondary to elevated liver enzymes  11/26/2019-CT with continued improvement in the hepatic tumor burden 12/10/2019-chemotherapy restarted with gemcitabine/oxaliplatin 12/24/2019 chemotherapy continued, FUDR 28 mg 02/04/2020-oxaliplatin held secondary to worsening neuropathy 03/02/2020-CTs-stable disease Treatment continued with FUDR and gemcitabine CTs 05/26/2020-unchanged treated lesions in the liver, unchanged left upper lobe pulmonary nodules, no evidence of disease progression 05/26/2020-recommendation to continue current therapy with gemcitabine plus FUDR (as labs allow) 06/09/2020-FUDR held 06/23/2020-FUDR held, continue gemcitabine, steroids given in HIA flush 07/21/2020 every 2-week Gemcitabine Family history of multiple cancers eluding breast, pancreatic, melanoma, appendiceal cancer, lung cancer Osteoporosis Neutropenia secondary to chemotherapy, Udenyca added with day 8 cycle 4        Disposition: Ms. Gatlin appears stable.  She will complete another treatment with gemcitabine today.  We will we will flush the hepatic infusion pump today.  Hepatic infusion therapy remains on hold.  We will follow-up on the chemistry panel from today.  She is scheduled to see Dr. Fanny Skates for a restaging evaluation in 2 weeks.  Ms. Rudden will return for an office visit and gemcitabine in 4 weeks.  [name], MD  08/04/2020  8:45 AM

## 2020-09-01 ENCOUNTER — Telehealth: Payer: Self-pay

## 2020-09-01 ENCOUNTER — Inpatient Hospital Stay: Payer: No Typology Code available for payment source | Attending: Oncology

## 2020-09-01 ENCOUNTER — Inpatient Hospital Stay (HOSPITAL_BASED_OUTPATIENT_CLINIC_OR_DEPARTMENT_OTHER): Payer: No Typology Code available for payment source | Admitting: Oncology

## 2020-09-01 ENCOUNTER — Inpatient Hospital Stay: Payer: No Typology Code available for payment source

## 2020-09-01 ENCOUNTER — Other Ambulatory Visit: Payer: Self-pay

## 2020-09-01 VITALS — BP 113/63 | HR 73 | Temp 97.7°F | Resp 18 | Wt 136.8 lb

## 2020-09-01 DIAGNOSIS — C221 Intrahepatic bile duct carcinoma: Secondary | ICD-10-CM

## 2020-09-01 DIAGNOSIS — Z5111 Encounter for antineoplastic chemotherapy: Secondary | ICD-10-CM | POA: Diagnosis present

## 2020-09-01 DIAGNOSIS — Z95828 Presence of other vascular implants and grafts: Secondary | ICD-10-CM

## 2020-09-01 DIAGNOSIS — Z79899 Other long term (current) drug therapy: Secondary | ICD-10-CM | POA: Insufficient documentation

## 2020-09-01 LAB — CBC WITH DIFFERENTIAL (CANCER CENTER ONLY)
Abs Immature Granulocytes: 0.01 10*3/uL (ref 0.00–0.07)
Basophils Absolute: 0 10*3/uL (ref 0.0–0.1)
Basophils Relative: 1 %
Eosinophils Absolute: 0.1 10*3/uL (ref 0.0–0.5)
Eosinophils Relative: 2 %
HCT: 35.2 % — ABNORMAL LOW (ref 36.0–46.0)
Hemoglobin: 11.4 g/dL — ABNORMAL LOW (ref 12.0–15.0)
Immature Granulocytes: 0 %
Lymphocytes Relative: 42 %
Lymphs Abs: 1.5 10*3/uL (ref 0.7–4.0)
MCH: 29.8 pg (ref 26.0–34.0)
MCHC: 32.4 g/dL (ref 30.0–36.0)
MCV: 92.1 fL (ref 80.0–100.0)
Monocytes Absolute: 0.5 10*3/uL (ref 0.1–1.0)
Monocytes Relative: 15 %
Neutro Abs: 1.4 10*3/uL — ABNORMAL LOW (ref 1.7–7.7)
Neutrophils Relative %: 40 %
Platelet Count: 196 10*3/uL (ref 150–400)
RBC: 3.82 MIL/uL — ABNORMAL LOW (ref 3.87–5.11)
RDW: 15.6 % — ABNORMAL HIGH (ref 11.5–15.5)
WBC Count: 3.6 10*3/uL — ABNORMAL LOW (ref 4.0–10.5)
nRBC: 0 % (ref 0.0–0.2)

## 2020-09-01 LAB — CMP (CANCER CENTER ONLY)
ALT: 41 U/L (ref 0–44)
AST: 30 U/L (ref 15–41)
Albumin: 4 g/dL (ref 3.5–5.0)
Alkaline Phosphatase: 364 U/L — ABNORMAL HIGH (ref 38–126)
Anion gap: 9 (ref 5–15)
BUN: 12 mg/dL (ref 8–23)
CO2: 27 mmol/L (ref 22–32)
Calcium: 9.2 mg/dL (ref 8.9–10.3)
Chloride: 103 mmol/L (ref 98–111)
Creatinine: 0.54 mg/dL (ref 0.44–1.00)
GFR, Estimated: >60 mL/min (ref >60)
Glucose, Bld: 99 mg/dL (ref 70–99)
Potassium: 3.7 mmol/L (ref 3.5–5.1)
Sodium: 139 mmol/L (ref 135–145)
Total Bilirubin: 1.3 mg/dL — ABNORMAL HIGH (ref 0.3–1.2)
Total Protein: 6.2 g/dL — ABNORMAL LOW (ref 6.5–8.1)

## 2020-09-01 MED ORDER — SODIUM CHLORIDE 0.9% FLUSH
10.0000 mL | INTRAVENOUS | Status: DC | PRN
  Filled 2020-09-01: qty 10

## 2020-09-01 MED ORDER — DEXAMETHASONE 4 MG PO TABS
12.0000 mg | ORAL_TABLET | Freq: Once | ORAL | Status: AC
  Administered 2020-09-01: 12 mg via ORAL
  Filled 2020-09-01: qty 3

## 2020-09-01 MED ORDER — SODIUM CHLORIDE 0.9 % IV SOLN
Freq: Once | INTRAVENOUS | Status: AC
  Filled 2020-09-01: qty 250

## 2020-09-01 MED ORDER — SODIUM CHLORIDE (PF) 0.9 % IJ SOLN
Freq: Once | INTRAMUSCULAR | Status: AC
  Filled 2020-09-01: qty 5

## 2020-09-01 MED ORDER — SODIUM CHLORIDE 0.9% FLUSH
10.0000 mL | Freq: Once | INTRAVENOUS | Status: AC
  Administered 2020-09-01: 10 mL
  Filled 2020-09-01: qty 10

## 2020-09-01 MED ORDER — HEPARIN SOD (PORK) LOCK FLUSH 100 UNIT/ML IV SOLN
500.0000 [IU] | Freq: Once | INTRAVENOUS | Status: AC | PRN
  Administered 2020-09-01: 500 [IU]
  Filled 2020-09-01: qty 5

## 2020-09-01 MED ORDER — SODIUM CHLORIDE 0.9 % IV SOLN
800.0000 mg/m2 | Freq: Once | INTRAVENOUS | Status: AC
  Administered 2020-09-01: 1330 mg via INTRAVENOUS
  Filled 2020-09-01: qty 26.3

## 2020-09-01 NOTE — Telephone Encounter (Signed)
Per Dr Hillery Aldo OK to treat with ANC 1.4  Pt will give herself GCSF injection after treatment.

## 2020-09-01 NOTE — Addendum Note (Signed)
Addended by: [name] B on: 09/01/2020 09:49 AM   Modules accepted: Orders

## 2020-09-01 NOTE — Patient Instructions (Signed)
Angela Fleming   Discharge Instructions: Thank you for choosing [facility] to provide your oncology and hematology care.   If you have a lab appointment with the [facility], please go directly to the [facility] and check in at the registration area.   Wear comfortable clothing and clothing appropriate for easy access to any Portacath or PICC line.   We strive to give you quality time with your provider. You may need to reschedule your appointment if you arrive late (15 or more minutes).  Arriving late affects you and other patients whose appointments are after yours.  Also, if you miss three or more appointments without notifying the office, you may be dismissed from the clinic at the provider's discretion.      For prescription refill requests, have your pharmacy contact our office and allow 72 hours for refills to be completed.    Today you received the following chemotherapy and/or immunotherapy agents Gemcitabine (GEMZAR).      To help prevent nausea and vomiting after your treatment, we encourage you to take your nausea medication as directed.  BELOW ARE SYMPTOMS THAT SHOULD BE REPORTED IMMEDIATELY: *FEVER GREATER THAN 100.4 F (38 C) OR HIGHER *CHILLS OR SWEATING *NAUSEA AND VOMITING THAT IS NOT CONTROLLED WITH YOUR NAUSEA MEDICATION *UNUSUAL SHORTNESS OF BREATH *UNUSUAL BRUISING OR BLEEDING *URINARY PROBLEMS (pain or burning when urinating, or frequent urination) *BOWEL PROBLEMS (unusual diarrhea, constipation, pain near the anus) TENDERNESS IN MOUTH AND THROAT WITH OR WITHOUT PRESENCE OF ULCERS (sore throat, sores in mouth, or a toothache) UNUSUAL RASH, SWELLING OR PAIN  UNUSUAL VAGINAL DISCHARGE OR ITCHING   Items with * indicate a potential emergency and should be followed up as soon as possible or go to the Emergency Department if any problems should occur.  Please show the CHEMOTHERAPY ALERT CARD or IMMUNOTHERAPY ALERT CARD at  check-in to the Emergency Department and triage nurse.  Should you have questions after your visit or need to cancel or reschedule your appointment, please contact [facility]  Dept: [phone]  and follow the prompts.  Office hours are 8:00 a.m. to 4:30 p.m. Monday - Friday. Please note that voicemails left after 4:00 p.m. may not be returned until the following business day.  We are closed weekends and major holidays. You have access to a nurse at all times for urgent questions. Please call the main number to the clinic Dept: [phone] and follow the prompts.   For any non-urgent questions, you may also contact your provider using MyChart. We now offer e-Visits for anyone 49 and older to request care online for non-urgent symptoms. For details visit [email or web address].   Also download the MyChart app! Go to the app store, search "MyChart", open the app, select [facility], and log in with your MyChart username and password.  Due to Covid, a mask is required upon entering the hospital/clinic. If you do not have a mask, one will be given to you upon arrival. For doctor visits, patients may have 1 support person aged 49 or older with them. For treatment visits, patients cannot have anyone with them due to current Covid guidelines and our immunocompromised population.   Gemcitabine injection What is this medication? GEMCITABINE (jem SYE ta been) is a chemotherapy drug. This medicine is used to treat many types of cancer like breast cancer, lung cancer, pancreatic cancer,and ovarian cancer. This medicine may be used for other purposes; ask your health care provider  orpharmacist if you have questions. COMMON BRAND NAME(S): Gemzar, Infugem What should I tell my care team before I take this medication? They need to know if you have any of these conditions: blood disorders infection kidney disease liver disease lung or breathing disease, like asthma recent or  ongoing radiation therapy an unusual or allergic reaction to gemcitabine, other chemotherapy, other medicines, foods, dyes, or preservatives pregnant or trying to get pregnant breast-feeding How should I use this medication? This drug is given as an infusion into a vein. It is administered in a hospitalor clinic by a specially trained health care professional. Talk to your pediatrician regarding the use of this medicine in children.Special care may be needed. Overdosage: If you think you have taken too much of this medicine contact apoison control center or emergency room at once. NOTE: This medicine is only for you. Do not share this medicine with others. What if I miss a dose? It is important not to miss your dose. Call your doctor or health careprofessional if you are unable to keep an appointment. What may interact with this medication? medicines to increase blood counts like filgrastim, pegfilgrastim, sargramostim some other chemotherapy drugs like cisplatin vaccines Talk to your doctor or health care professional before taking any of thesemedicines: acetaminophen aspirin ibuprofen ketoprofen naproxen This list may not describe all possible interactions. Give your health care provider a list of all the medicines, herbs, non-prescription drugs, or dietary supplements you use. Also tell them if you smoke, drink alcohol, or use illegaldrugs. Some items may interact with your medicine. What should I watch for while using this medication? Visit your doctor for checks on your progress. This drug may make you feel generally unwell. This is not uncommon, as chemotherapy can affect healthy cells as well as cancer cells. Report any side effects. Continue your course oftreatment even though you feel ill unless your doctor tells you to stop. In some cases, you may be given additional medicines to help with side effects.Follow all directions for their use. Call your doctor or health care  professional for advice if you get a fever, chills or sore throat, or other symptoms of a cold or flu. Do not treat yourself. This drug decreases your body's ability to fight infections. Try toavoid being around people who are sick. This medicine may increase your risk to bruise or bleed. Call your doctor orhealth care professional if you notice any unusual bleeding. Be careful brushing and flossing your teeth or using a toothpick because you may get an infection or bleed more easily. If you have any dental work done,tell your dentist you are receiving this medicine. Avoid taking products that contain aspirin, acetaminophen, ibuprofen, naproxen, or ketoprofen unless instructed by your doctor. These medicines may hide afever. Do not become pregnant while taking this medicine or for 6 months after stopping it. Women should inform their doctor if they wish to become pregnant or think they might be pregnant. Men should not father a child while taking this medicine and for 3 months after stopping it. There is a potential for serious side effects to an unborn child. Talk to your health care professional or pharmacist for more information. Do not breast-feed an infant while takingthis medicine or for at least 1 week after stopping it. Men should inform their doctors if they wish to father a child. This medicine may lower sperm counts. Talk with your doctor or health care professional ifyou are concerned about your fertility. What side effects may I notice  from receiving this medication? Side effects that you should report to your doctor or health care professionalas soon as possible: allergic reactions like skin rash, itching or hives, swelling of the face, lips, or tongue breathing problems pain, redness, or irritation at site where injected signs and symptoms of a dangerous change in heartbeat or heart rhythm like chest pain; dizziness; fast or irregular heartbeat; palpitations; feeling faint or lightheaded,  falls; breathing problems signs of decreased platelets or bleeding - bruising, pinpoint red spots on the skin, black, tarry stools, blood in the urine signs of decreased red blood cells - unusually weak or tired, feeling faint or lightheaded, falls signs of infection - fever or chills, cough, sore throat, pain or difficulty passing urine signs and symptoms of kidney injury like trouble passing urine or change in the amount of urine signs and symptoms of liver injury like dark yellow or brown urine; general ill feeling or flu-like symptoms; light-colored stools; loss of appetite; nausea; right upper belly pain; unusually weak or tired; yellowing of the eyes or skin swelling of ankles, feet, hands Side effects that usually do not require medical attention (report to yourdoctor or health care professional if they continue or are bothersome): constipation diarrhea hair loss loss of appetite nausea rash vomiting This list may not describe all possible side effects. Call your doctor for medical advice about side effects. You may report side effects to FDA at1-800-FDA-1088. Where should I keep my medication? This drug is given in a hospital or clinic and will not be stored at home. NOTE: This sheet is a summary. It may not cover all possible information. If you have questions about this medicine, talk to your doctor, pharmacist, orhealth care provider.  2022 Elsevier/Gold Standard (2017-04-24 18:06:11)

## 2020-09-01 NOTE — Progress Notes (Signed)
Accessed Medtronic II pump using sterile technique without difficulty. Aspirated 3.2 ml from port. Instilled 25,000 units heparin in 65ml NS slowly, aspirating every 3 ml. Needle d/c'd and Ipad updated. Next fill scheduled for 09/29/20. Patient tolerated procedure well.

## 2020-09-01 NOTE — Progress Notes (Signed)
Angela Fleming OFFICE PROGRESS NOTE   Diagnosis: Cholangiocarcinoma  INTERVAL HISTORY:   Angela Fleming returns as scheduled.  She continues every 2-week gemcitabine.  No rash or fever.  No pain.  She reports pruritus and intermittent nausea.  No jaundice. She underwent restaging CTs at [facility] on 08/18/2020.  Objective:  Vital signs in last 24 hours:  Blood pressure 113/63, pulse 73, temperature 97.7 F (36.5 C), temperature source Tympanic, resp. rate 18, weight 136 lb 12.8 oz (62.1 kg), SpO2 100 %.    HEENT: No thrush or ulcers Resp: Lungs clear bilaterally Cardio: Regular rate and rhythm GI: No hepatosplenomegaly, nontender, left abdomen infusion pump Vascular: No leg edema    Portacath/PICC-without erythema  Lab Results:  Lab Results  Component Value Date   WBC 3.6 (L) 09/01/2020   HGB 11.4 (L) 09/01/2020   HCT 35.2 (L) 09/01/2020   MCV 92.1 09/01/2020   PLT 196 09/01/2020   NEUTROABS 1.4 (L) 09/01/2020    CMP  Lab Results  Component Value Date   NA 138 08/04/2020   K 4.1 08/04/2020   CL 104 08/04/2020   CO2 27 08/04/2020   GLUCOSE 99 08/04/2020   BUN 20 08/04/2020   CREATININE 0.62 08/04/2020   CALCIUM 9.4 08/04/2020   PROT 6.3 (L) 08/04/2020   ALBUMIN 4.2 08/04/2020   AST 39 08/04/2020   ALT 64 (H) 08/04/2020   ALKPHOS 236 (H) 08/04/2020   BILITOT 0.5 08/04/2020   GFRNONAA >60 08/04/2020   GFRAA >60 05/13/2019    Lab Results  Component Value Date   CEA1 1.02 01/02/2019   EOF121 5 01/02/2019    Medications: I have reviewed the patient's current medications.   Assessment/Plan: Intrahepatic cholangiocarcinoma Abdominal ultrasound 10/08/2018-heterogenous right hepatic mass MRI abdomen 10/14/2018-2 enhancing right liver lesions, 4.8 x 4.5 x 4.1 cm, more superior lesion measured 3 x 2 x 2.5 cm, multiple additional subcentimeter T2 hyperintense lesions-too small to characterize. CT chest 11/04/2018-6 mm left upper lobe groundglass nodule, no  evidence of metastatic disease in the chest Biopsy of right liver lesion at [facility] 11/10/2018-moderately differentiated adenocarcinoma consistent with cholangiocarcinoma; IDH1 alteration; MSI stable; mismatch repair status proficient; tumor mutational burden low, 3; PD-L1 negative CTs at [facility] 12/12/2018-right hepatic lobe masses, satellite lesions adjacent to the dominant mass, prominent periportal and aortocaval lymph nodes prominent left supraclavicular node Cycle 1 gemcitabine/cisplatin at [facility] on 12/12/2018 Cycle 2 gemcitabine/cisplatin 01/02/2019, 01/09/2019 Cycle 3 gemcitabine/cisplatin 01/23/2019 CTs at [facility] 02/02/2019-decrease in size of multiple hepatic masses, decreased size of prominent periportal and aortocaval nodes, unchanged prominent left supraclavicular node Cycle 4 gemcitabine/cisplatin 02/11/2019, Udenyca added and gemcitabine dose reduced with day 8 chemotherapy Cycle 5 gemcitabine/cisplatin 03/04/2019, gemcitabine reescalated to full dose Cycle 6 gemcitabine/cisplatin 03/25/2019 Cycle 7 gemcitabine/cisplatin 04/15/2019 Cycle 8 gemcitabine/cisplatin 05/06/2019 CT 05/18/2019-decreased size of multiple right hepatic masses 06/17/2019-hepatic arterial infusion pump placed, resection of hepatic artery/portal nodes, 1/7 portal nodes positive 07/10/2019-FUDR No. 1 07/23/2019 gemcitabine 800 mg/m added 08/06/2019-FUDR No. 2, 55 mg (dose reduced due to mild increase in bilirubin (, gemcitabine 800 mg/m and oxaliplatin 68 mg/m 08/20/2019-treatment continued, Decadron and pump continue due to mild increase in alkaline phosphatase 09/17/2019, FUDR No. 3 at 28 mg (dose reduced due to continued increase in alkaline phosphatase) 10/01/2019, CTs with continued response to therapy, decrease in liver disease 10/01/2019, therapy continued 10/15/2019, therapy continue with gemcitabine and oxaliplatin, FUDR restarted at 55 mg 11/12/2019-alkaline phosphatase elevated, FUDR held, Decadron given in hepatic  infusion flush 11/26/2019 chemotherapy held  secondary to elevated liver enzymes 11/26/2019-CT with continued improvement in the hepatic tumor burden 12/10/2019-chemotherapy restarted with gemcitabine/oxaliplatin 12/24/2019 chemotherapy continued, FUDR 28 mg 02/04/2020-oxaliplatin held secondary to worsening neuropathy 03/02/2020-CTs-stable disease Treatment continued with FUDR and gemcitabine CTs 05/26/2020-unchanged treated lesions in the liver, unchanged left upper lobe pulmonary nodules, no evidence of disease progression 05/26/2020-recommendation to continue current therapy with gemcitabine plus FUDR (as labs allow) 06/09/2020-FUDR held 06/23/2020-FUDR held, continue gemcitabine, steroids given in HIA flush 07/21/2020 every 2-week Gemcitabine CTs at [facility] 08/18/2020-stable treated hepatic metastases, no evidence of new or enlarging metastatic disease Every 2-week gemcitabine continued 08/31/2020 gemcitabine, Udenyca Family history of multiple cancers eluding breast, pancreatic, melanoma, appendiceal cancer, lung cancer Osteoporosis Neutropenia secondary to chemotherapy, Udenyca added with day 8 cycle 4         Disposition:  Angela Fleming appears stable.  Restaging CTs at [facility] on 08/18/2020 revealed no evidence of progressive disease.  Dr. Fanny Skates recommends FUDR to remain on hold.  The alkaline phosphatase and bilirubin remain mildly elevated.  She is scheduled for a CT PET at [facility] in September.  The neutrophil count is mildly low today.  She will take [place] tomorrow.  She knows to call for a fever or symptoms of an infection.  Angela Fleming will return for an office visit and gemcitabine in 2 weeks.  We flushed the hepatic infusion pump today.  [name], MD  09/01/2020  8:58 AM

## 2020-09-02 ENCOUNTER — Telehealth: Payer: Self-pay

## 2020-09-02 NOTE — Telephone Encounter (Signed)
TC to Pt relayed message per Dr Benay Spice about elevated bilirubin. Pt verbalized understanding. Pt also wanted to let us know the injection she had was ziextenzo instead of udenyca informed Pt that this was ok. Pt verbalized understanding. No further problems or concerns noted.

## 2020-09-02 NOTE — Telephone Encounter (Signed)
-----   Message from Ladell Pier, MD sent at 09/01/2020  7:54 PM EDT ----- Please call patient, bilirubin mildly elevated, may explain pruritis, will repeat next visit

## 2020-09-07 ENCOUNTER — Encounter: Payer: Self-pay | Admitting: Oncology

## 2020-09-07 ENCOUNTER — Other Ambulatory Visit: Payer: Self-pay | Admitting: *Deleted

## 2020-09-07 MED ORDER — HYDROXYZINE HCL 10 MG PO TABS: 10.0000 mg | ORAL_TABLET | Freq: Three times a day (TID) | ORAL | 0 refills | Status: DC | PRN

## 2020-09-07 NOTE — Telephone Encounter (Signed)
Requesting to change her Atarax dose to 10 mg from 25 mg and send to pharmacy in [place] where she is visiting.

## 2020-09-12 ENCOUNTER — Encounter: Payer: Self-pay | Admitting: Oncology

## 2020-09-12 ENCOUNTER — Inpatient Hospital Stay: Payer: No Typology Code available for payment source | Attending: Oncology

## 2020-09-12 ENCOUNTER — Telehealth: Payer: Self-pay | Admitting: *Deleted

## 2020-09-12 ENCOUNTER — Other Ambulatory Visit: Payer: Self-pay

## 2020-09-12 DIAGNOSIS — Z808 Family history of malignant neoplasm of other organs or systems: Secondary | ICD-10-CM | POA: Insufficient documentation

## 2020-09-12 DIAGNOSIS — D701 Agranulocytosis secondary to cancer chemotherapy: Secondary | ICD-10-CM | POA: Diagnosis not present

## 2020-09-12 DIAGNOSIS — Z803 Family history of malignant neoplasm of breast: Secondary | ICD-10-CM | POA: Insufficient documentation

## 2020-09-12 DIAGNOSIS — T451X5A Adverse effect of antineoplastic and immunosuppressive drugs, initial encounter: Secondary | ICD-10-CM | POA: Insufficient documentation

## 2020-09-12 DIAGNOSIS — C221 Intrahepatic bile duct carcinoma: Secondary | ICD-10-CM | POA: Diagnosis not present

## 2020-09-12 DIAGNOSIS — Z800 Family history of malignant neoplasm of digestive organs: Secondary | ICD-10-CM | POA: Diagnosis not present

## 2020-09-12 DIAGNOSIS — Z801 Family history of malignant neoplasm of trachea, bronchus and lung: Secondary | ICD-10-CM | POA: Diagnosis not present

## 2020-09-12 LAB — CBC WITH DIFFERENTIAL (CANCER CENTER ONLY)
Abs Immature Granulocytes: 0.06 10*3/uL (ref 0.00–0.07)
Basophils Absolute: 0 10*3/uL (ref 0.0–0.1)
Basophils Relative: 1 %
Eosinophils Absolute: 0.1 10*3/uL (ref 0.0–0.5)
Eosinophils Relative: 1 %
HCT: 37.6 % (ref 36.0–46.0)
Hemoglobin: 12 g/dL (ref 12.0–15.0)
Immature Granulocytes: 2 %
Lymphocytes Relative: 30 %
Lymphs Abs: 1.2 10*3/uL (ref 0.7–4.0)
MCH: 29.7 pg (ref 26.0–34.0)
MCHC: 31.9 g/dL (ref 30.0–36.0)
MCV: 93.1 fL (ref 80.0–100.0)
Monocytes Absolute: 0.3 10*3/uL (ref 0.1–1.0)
Monocytes Relative: 8 %
Neutro Abs: 2.4 10*3/uL (ref 1.7–7.7)
Neutrophils Relative %: 58 %
Platelet Count: 157 10*3/uL (ref 150–400)
RBC: 4.04 MIL/uL (ref 3.87–5.11)
RDW: 16.2 % — ABNORMAL HIGH (ref 11.5–15.5)
WBC Count: 4.1 10*3/uL (ref 4.0–10.5)
nRBC: 0 % (ref 0.0–0.2)

## 2020-09-12 LAB — CMP (CANCER CENTER ONLY)
ALT: 78 U/L — ABNORMAL HIGH (ref 0–44)
AST: 56 U/L — ABNORMAL HIGH (ref 15–41)
Albumin: 3.9 g/dL (ref 3.5–5.0)
Alkaline Phosphatase: 392 U/L — ABNORMAL HIGH (ref 38–126)
Anion gap: 8 (ref 5–15)
BUN: 15 mg/dL (ref 8–23)
CO2: 27 mmol/L (ref 22–32)
Calcium: 9.4 mg/dL (ref 8.9–10.3)
Chloride: 103 mmol/L (ref 98–111)
Creatinine: 0.53 mg/dL (ref 0.44–1.00)
GFR, Estimated: >60 mL/min (ref >60)
Glucose, Bld: 92 mg/dL (ref 70–99)
Potassium: 4.3 mmol/L (ref 3.5–5.1)
Sodium: 138 mmol/L (ref 135–145)
Total Bilirubin: 1.7 mg/dL — ABNORMAL HIGH (ref 0.3–1.2)
Total Protein: 6.5 g/dL (ref 6.5–8.1)

## 2020-09-12 NOTE — Telephone Encounter (Signed)
Notified patient of elevation of liver functions and that Dr. Benay Spice will email Dr. Fanny Skates today to discuss.

## 2020-09-14 ENCOUNTER — Other Ambulatory Visit: Payer: Self-pay | Admitting: Oncology

## 2020-09-14 ENCOUNTER — Encounter: Payer: Self-pay | Admitting: Oncology

## 2020-09-14 ENCOUNTER — Encounter: Payer: Self-pay | Admitting: *Deleted

## 2020-09-15 ENCOUNTER — Inpatient Hospital Stay: Payer: No Typology Code available for payment source

## 2020-09-15 ENCOUNTER — Inpatient Hospital Stay (HOSPITAL_BASED_OUTPATIENT_CLINIC_OR_DEPARTMENT_OTHER): Payer: No Typology Code available for payment source | Admitting: Nurse Practitioner

## 2020-09-15 ENCOUNTER — Encounter: Payer: Self-pay | Admitting: Nurse Practitioner

## 2020-09-15 ENCOUNTER — Other Ambulatory Visit: Payer: Self-pay

## 2020-09-15 VITALS — BP 109/62 | HR 76 | Temp 97.8°F | Resp 20 | Ht 64.0 in | Wt 133.8 lb

## 2020-09-15 DIAGNOSIS — Z95828 Presence of other vascular implants and grafts: Secondary | ICD-10-CM | POA: Diagnosis not present

## 2020-09-15 DIAGNOSIS — C221 Intrahepatic bile duct carcinoma: Secondary | ICD-10-CM

## 2020-09-15 LAB — CMP (CANCER CENTER ONLY)
ALT: 69 U/L — ABNORMAL HIGH (ref 0–44)
AST: 45 U/L — ABNORMAL HIGH (ref 15–41)
Albumin: 4 g/dL (ref 3.5–5.0)
Alkaline Phosphatase: 368 U/L — ABNORMAL HIGH (ref 38–126)
Anion gap: 8 (ref 5–15)
BUN: 18 mg/dL (ref 8–23)
CO2: 27 mmol/L (ref 22–32)
Calcium: 9.1 mg/dL (ref 8.9–10.3)
Chloride: 103 mmol/L (ref 98–111)
Creatinine: 0.42 mg/dL — ABNORMAL LOW (ref 0.44–1.00)
GFR, Estimated: >60 mL/min (ref >60)
Glucose, Bld: 108 mg/dL — ABNORMAL HIGH (ref 70–99)
Potassium: 3.9 mmol/L (ref 3.5–5.1)
Sodium: 138 mmol/L (ref 135–145)
Total Bilirubin: 1.7 mg/dL — ABNORMAL HIGH (ref 0.3–1.2)
Total Protein: 6.3 g/dL — ABNORMAL LOW (ref 6.5–8.1)

## 2020-09-15 LAB — CBC WITH DIFFERENTIAL (CANCER CENTER ONLY)
Abs Immature Granulocytes: 0.02 10*3/uL (ref 0.00–0.07)
Basophils Absolute: 0 10*3/uL (ref 0.0–0.1)
Basophils Relative: 1 %
Eosinophils Absolute: 0.1 10*3/uL (ref 0.0–0.5)
Eosinophils Relative: 2 %
HCT: 35.3 % — ABNORMAL LOW (ref 36.0–46.0)
Hemoglobin: 11.3 g/dL — ABNORMAL LOW (ref 12.0–15.0)
Immature Granulocytes: 1 %
Lymphocytes Relative: 32 %
Lymphs Abs: 1 10*3/uL (ref 0.7–4.0)
MCH: 29.5 pg (ref 26.0–34.0)
MCHC: 32 g/dL (ref 30.0–36.0)
MCV: 92.2 fL (ref 80.0–100.0)
Monocytes Absolute: 0.4 10*3/uL (ref 0.1–1.0)
Monocytes Relative: 12 %
Neutro Abs: 1.6 10*3/uL — ABNORMAL LOW (ref 1.7–7.7)
Neutrophils Relative %: 52 %
Platelet Count: 248 10*3/uL (ref 150–400)
RBC: 3.83 MIL/uL — ABNORMAL LOW (ref 3.87–5.11)
RDW: 15.6 % — ABNORMAL HIGH (ref 11.5–15.5)
WBC Count: 3 10*3/uL — ABNORMAL LOW (ref 4.0–10.5)
nRBC: 0 % (ref 0.0–0.2)

## 2020-09-15 MED ORDER — HEPARIN SOD (PORK) LOCK FLUSH 100 UNIT/ML IV SOLN
500.0000 [IU] | Freq: Once | INTRAVENOUS | Status: AC
  Administered 2020-09-15: 500 [IU] via INTRAVENOUS
  Filled 2020-09-15: qty 5

## 2020-09-15 MED ORDER — SODIUM CHLORIDE 0.9% FLUSH
10.0000 mL | INTRAVENOUS | Status: DC | PRN
  Administered 2020-09-15: 10 mL via INTRAVENOUS
  Filled 2020-09-15: qty 10

## 2020-09-15 NOTE — Progress Notes (Signed)
[place] OFFICE PROGRESS NOTE   Diagnosis: Cholangiocarcinoma  INTERVAL HISTORY:   Angela Fleming returns as scheduled.  She continues every 2-week Gemcitabine.  She continues to note pruritus.  Energy level is poor.  Appetite diminished.  She is having difficulty sleeping.  No fever, cough, shortness of breath.  Objective:  Vital signs in last 24 hours:  Blood pressure 109/62, pulse 76, temperature 97.8 F (36.6 C), temperature source Oral, resp. rate 20, height 5' 4"  (1.626 m), weight 133 lb 12.8 oz (60.7 kg), SpO2 98 %.    HEENT: No thrush or ulcers. Resp: Lungs clear bilaterally. Cardio: Regular rate and rhythm. GI: Abdomen soft and nontender.  No hepatomegaly.  Left abdomen infusion pump. Vascular: No leg edema. Skin: Small erythematous, slightly raised skin lesions, some scabbed, scattered over the anterior chest wall/upper abdomen, a few on the back. Port-A-Cath without erythema.  Lab Results:  Lab Results  Component Value Date   WBC 3.0 (L) 09/15/2020   HGB 11.3 (L) 09/15/2020   HCT 35.3 (L) 09/15/2020   MCV 92.2 09/15/2020   PLT 248 09/15/2020   NEUTROABS 1.6 (L) 09/15/2020    Imaging:  No results found.  Medications: I have reviewed the patient's current medications.  Assessment/Plan: Intrahepatic cholangiocarcinoma Abdominal ultrasound 10/08/2018-heterogenous right hepatic mass MRI abdomen 10/14/2018-2 enhancing right liver lesions, 4.8 x 4.5 x 4.1 cm, more superior lesion measured 3 x 2 x 2.5 cm, multiple additional subcentimeter T2 hyperintense lesions-too small to characterize. CT chest 11/04/2018-6 mm left upper lobe groundglass nodule, no evidence of metastatic disease in the chest Biopsy of right liver lesion at [facility] 11/10/2018-moderately differentiated adenocarcinoma consistent with cholangiocarcinoma; IDH1 alteration; MSI stable; mismatch repair status proficient; tumor mutational burden low, 3; PD-L1 negative CTs at [facility]  12/12/2018-right hepatic lobe masses, satellite lesions adjacent to the dominant mass, prominent periportal and aortocaval lymph nodes prominent left supraclavicular node Cycle 1 gemcitabine/cisplatin at [place] on 12/12/2018 Cycle 2 gemcitabine/cisplatin 01/02/2019, 01/09/2019 Cycle 3 gemcitabine/cisplatin 01/23/2019 [facility] 02/02/2019-decrease in size of multiple hepatic masses, decreased size of prominent periportal and aortocaval nodes, unchanged prominent left supraclavicular node Cycle 4 gemcitabine/cisplatin 02/11/2019, Udenyca added and gemcitabine dose reduced with day 8 chemotherapy Cycle 5 gemcitabine/cisplatin 03/04/2019, gemcitabine reescalated to full dose Cycle 6 gemcitabine/cisplatin 03/25/2019 Cycle 7 gemcitabine/cisplatin 04/15/2019 Cycle 8 gemcitabine/cisplatin 05/06/2019 CT 05/18/2019-decreased size of multiple right hepatic masses 06/17/2019-hepatic arterial infusion pump placed, resection of hepatic artery/portal nodes, 1/7 portal nodes positive 07/10/2019-FUDR No. 1 07/23/2019 gemcitabine 800 mg/m added 08/06/2019-FUDR No. 2, 55 mg (dose reduced due to mild increase in bilirubin (, gemcitabine 800 mg/m and oxaliplatin 68 mg/m 08/20/2019-treatment continued, Decadron and pump continue due to mild increase in alkaline phosphatase 09/17/2019, FUDR No. 3 at 28 mg (dose reduced due to continued increase in alkaline phosphatase) 10/01/2019, CTs with continued response to therapy, decrease in liver disease 10/01/2019, therapy continued 10/15/2019, therapy continue with gemcitabine and oxaliplatin, FUDR restarted at 55 mg 11/12/2019-alkaline phosphatase elevated, FUDR held, Decadron given in hepatic infusion flush 11/26/2019 chemotherapy held secondary to elevated liver enzymes 11/26/2019-CT with continued improvement in the hepatic tumor burden 12/10/2019-chemotherapy restarted with gemcitabine/oxaliplatin 12/24/2019 chemotherapy continued, FUDR 28 mg 02/04/2020-oxaliplatin held secondary to  worsening neuropathy 03/02/2020-CTs-stable disease Treatment continued with FUDR and gemcitabine CTs 05/26/2020-unchanged treated lesions in the liver, unchanged left upper lobe pulmonary nodules, no evidence of disease progression 05/26/2020-recommendation to continue current therapy with gemcitabine plus FUDR (as labs allow) 06/09/2020-FUDR held 06/23/2020-FUDR held, continue gemcitabine, steroids given in HIA  flush 07/21/2020 every 2-week Gemcitabine CTs at [facility] 08/18/2020-stable treated hepatic metastases, no evidence of new or enlarging metastatic disease Every 2-week gemcitabine continued 08/31/2020 gemcitabine, Udenyca Family history of multiple cancers eluding breast, pancreatic, melanoma, appendiceal cancer, lung cancer Osteoporosis Neutropenia secondary to chemotherapy, Udenyca added with day 8 cycle 4      Disposition: Angela Fleming is on active treatment with gemcitabine.  There has been a recent increase in liver enzymes, stable today.  She is scheduled for MRI abdomen/MRCP 09/15/2020 at [facility].  Dr. Benay Spice recommends holding today's chemotherapy pending MRI results.  She agrees with this plan.  She appears to have insect bites scattered on the trunk.  She will return for lab, follow-up, possible Gemcitabine in 1 week.  We are available to see her sooner if needed.  Patient seen with Dr. Benay Spice.    Ned Card ANP/GNP-BC   09/15/2020  10:09 AM This was a shared visit with Ned Card.  Angela Fleming was interviewed and examined.  She has developed malaise and anorexia.  Pruritus may be related to biliary obstruction or bug bites.  We decided to hold chemotherapy today.  She is scheduled for an MRI/MRCP tomorrow.  She will return for an office visit next week.  I was present for greater than 50% of today's visit.  I performed medical decision making.  Julieanne Manson, MD

## 2020-09-19 ENCOUNTER — Encounter: Payer: Self-pay | Admitting: Oncology

## 2020-09-20 ENCOUNTER — Encounter: Payer: Self-pay | Admitting: *Deleted

## 2020-09-20 ENCOUNTER — Encounter: Payer: Self-pay | Admitting: Oncology

## 2020-09-20 ENCOUNTER — Telehealth: Payer: Self-pay

## 2020-09-20 ENCOUNTER — Other Ambulatory Visit: Payer: Self-pay | Admitting: Nurse Practitioner

## 2020-09-20 DIAGNOSIS — C221 Intrahepatic bile duct carcinoma: Secondary | ICD-10-CM

## 2020-09-20 NOTE — Telephone Encounter (Signed)
-----   Message from Irving Copas., MD sent at 09/20/2020  4:13 PM EDT ----- Regarding: RE: Referral Donald Jacque, I reviewed the patient's chart though I cannot see the actual MRCP findings (it was done at [facility]) with the elevation in liver tests and progressive elevation in bilirubin I think the concern of the left hepatic narrowing/stricture with upstream dilation is suggestive of a potential symptomatic stricture. She will benefit from an attempt at ERCP.  In the setting of her known cholangiocarcinoma. I should have a 2-hour slot at 1030 on 8/15. Would you be able to offer that to the patient? Otherwise if this does not work we will have to wait till my hospital week which would be until the week after. Thanks. GM ----- Message ----- From: Timothy Lasso, RN Sent: 09/20/2020   3:48 PM EDT To: Tania Ade, RN, Irving Copas., MD Subject: Melton Alar: Referral                                    ----- Message ----- From: Tania Ade, RN Sent: 09/20/2020   3:28 PM EDT To: Timothy Lasso, RN Subject: Referral                                       Hi Mckenzie Toruno, We just placed an order for Dr. Rush Landmark to see patient for having ERCP at request of Dr. Fanny Skates at [facility] as soon as possible.  Thank you, Manuela Schwartz [phone]

## 2020-09-21 ENCOUNTER — Encounter: Payer: Self-pay | Admitting: Oncology

## 2020-09-21 ENCOUNTER — Other Ambulatory Visit: Payer: Self-pay

## 2020-09-21 DIAGNOSIS — C221 Intrahepatic bile duct carcinoma: Secondary | ICD-10-CM

## 2020-09-21 DIAGNOSIS — R7989 Other specified abnormal findings of blood chemistry: Secondary | ICD-10-CM

## 2020-09-21 DIAGNOSIS — R17 Unspecified jaundice: Secondary | ICD-10-CM

## 2020-09-21 NOTE — Telephone Encounter (Signed)
ERCP scheduled for 09/26/20 at 1030 am at [facility] with GM.    ERCP scheduled, pt instructed and medications reviewed.  Patient instructions mailed to home.  Patient to call with any questions or concerns.

## 2020-09-22 ENCOUNTER — Other Ambulatory Visit: Payer: Self-pay

## 2020-09-22 ENCOUNTER — Inpatient Hospital Stay: Payer: No Typology Code available for payment source

## 2020-09-22 ENCOUNTER — Inpatient Hospital Stay (HOSPITAL_BASED_OUTPATIENT_CLINIC_OR_DEPARTMENT_OTHER): Payer: No Typology Code available for payment source | Admitting: Nurse Practitioner

## 2020-09-22 ENCOUNTER — Encounter: Payer: Self-pay | Admitting: Nurse Practitioner

## 2020-09-22 VITALS — BP 110/60 | HR 76 | Temp 98.1°F | Resp 20 | Ht 64.0 in | Wt 130.8 lb

## 2020-09-22 DIAGNOSIS — C221 Intrahepatic bile duct carcinoma: Secondary | ICD-10-CM

## 2020-09-22 LAB — CBC WITH DIFFERENTIAL (CANCER CENTER ONLY)
Abs Immature Granulocytes: 0.01 10*3/uL (ref 0.00–0.07)
Basophils Absolute: 0.1 10*3/uL (ref 0.0–0.1)
Basophils Relative: 2 %
Eosinophils Absolute: 0.1 10*3/uL (ref 0.0–0.5)
Eosinophils Relative: 2 %
HCT: 35 % — ABNORMAL LOW (ref 36.0–46.0)
Hemoglobin: 11.4 g/dL — ABNORMAL LOW (ref 12.0–15.0)
Immature Granulocytes: 0 %
Lymphocytes Relative: 39 %
Lymphs Abs: 1.1 10*3/uL (ref 0.7–4.0)
MCH: 29.9 pg (ref 26.0–34.0)
MCHC: 32.6 g/dL (ref 30.0–36.0)
MCV: 91.9 fL (ref 80.0–100.0)
Monocytes Absolute: 0.5 10*3/uL (ref 0.1–1.0)
Monocytes Relative: 16 %
Neutro Abs: 1.1 10*3/uL — ABNORMAL LOW (ref 1.7–7.7)
Neutrophils Relative %: 41 %
Platelet Count: 328 10*3/uL (ref 150–400)
RBC: 3.81 MIL/uL — ABNORMAL LOW (ref 3.87–5.11)
RDW: 15.6 % — ABNORMAL HIGH (ref 11.5–15.5)
WBC Count: 2.8 10*3/uL — ABNORMAL LOW (ref 4.0–10.5)
nRBC: 0 % (ref 0.0–0.2)

## 2020-09-22 LAB — CMP (CANCER CENTER ONLY)
ALT: 176 U/L — ABNORMAL HIGH (ref 0–44)
AST: 134 U/L — ABNORMAL HIGH (ref 15–41)
Albumin: 4 g/dL (ref 3.5–5.0)
Alkaline Phosphatase: 316 U/L — ABNORMAL HIGH (ref 38–126)
Anion gap: 7 (ref 5–15)
BUN: 12 mg/dL (ref 8–23)
CO2: 29 mmol/L (ref 22–32)
Calcium: 9.3 mg/dL (ref 8.9–10.3)
Chloride: 102 mmol/L (ref 98–111)
Creatinine: 0.52 mg/dL (ref 0.44–1.00)
GFR, Estimated: >60 mL/min
Glucose, Bld: 105 mg/dL — ABNORMAL HIGH (ref 70–99)
Potassium: 4 mmol/L (ref 3.5–5.1)
Sodium: 138 mmol/L (ref 135–145)
Total Bilirubin: 2.3 mg/dL — ABNORMAL HIGH (ref 0.3–1.2)
Total Protein: 6.5 g/dL (ref 6.5–8.1)

## 2020-09-22 NOTE — Progress Notes (Signed)
[place] OFFICE PROGRESS NOTE   Diagnosis: Cholangiocarcinoma  INTERVAL HISTORY:   Ms. Outland returns as scheduled.  She continues to have generalized pruritus.  She has tried oral antihistamines and lotion.  No significant improvement.  She has mild nausea.  No diarrhea.  She is not sleeping well.  Objective:  Vital signs in last 24 hours:  Blood pressure 110/60, pulse 76, temperature 98.1 F (36.7 C), temperature source Oral, resp. rate 20, height $RemoveBe'5\' 4"'XSfdbOOwd$  (1.626 m), weight 130 lb 12.8 oz (59.3 kg), SpO2 100 %.    HEENT: No thrush or ulcers. Resp: Lungs clear bilaterally. Cardio: Regular rate and rhythm. GI: No hepatomegaly.  Hepatic infusion pump left abdomen. Vascular: No leg edema. Skin: Small raised erythematous skin lesions anterior chest wall/breast, mid back. Port-A-Cath without erythema.  Lab Results:  Lab Results  Component Value Date   WBC 2.8 (L) 09/22/2020   HGB 11.4 (L) 09/22/2020   HCT 35.0 (L) 09/22/2020   MCV 91.9 09/22/2020   PLT 328 09/22/2020   NEUTROABS 1.1 (L) 09/22/2020    Imaging:  No results found.  Medications: I have reviewed the patient's current medications.  Assessment/Plan: Intrahepatic cholangiocarcinoma Abdominal ultrasound 10/08/2018-heterogenous right hepatic mass MRI abdomen 10/14/2018-2 enhancing right liver lesions, 4.8 x 4.5 x 4.1 cm, more superior lesion measured 3 x 2 x 2.5 cm, multiple additional subcentimeter T2 hyperintense lesions-too small to characterize. CT chest 11/04/2018-6 mm left upper lobe groundglass nodule, no evidence of metastatic disease in the chest Biopsy of right liver lesion at [facility] 11/10/2018-moderately differentiated adenocarcinoma consistent with cholangiocarcinoma; IDH1 alteration; MSI stable; mismatch repair status proficient; tumor mutational burden low, 3; PD-L1 negative CTs at [facility] 12/12/2018-right hepatic lobe masses, satellite lesions adjacent to the dominant mass, prominent  periportal and aortocaval lymph nodes prominent left supraclavicular node Cycle 1 gemcitabine/cisplatin at [place] on 12/12/2018 Cycle 2 gemcitabine/cisplatin 01/02/2019, 01/09/2019 Cycle 3 gemcitabine/cisplatin 01/23/2019 CTs at [facility] 02/02/2019-decrease in size of multiple hepatic masses, decreased size of prominent periportal and aortocaval nodes, unchanged prominent left supraclavicular node Cycle 4 gemcitabine/cisplatin 02/11/2019, Udenyca added and gemcitabine dose reduced with day 8 chemotherapy Cycle 5 gemcitabine/cisplatin 03/04/2019, gemcitabine reescalated to full dose Cycle 6 gemcitabine/cisplatin 03/25/2019 Cycle 7 gemcitabine/cisplatin 04/15/2019 Cycle 8 gemcitabine/cisplatin 05/06/2019 CT 05/18/2019-decreased size of multiple right hepatic masses 06/17/2019-hepatic arterial infusion pump placed, resection of hepatic artery/portal nodes, 1/7 portal nodes positive 07/10/2019-FUDR No. 1 07/23/2019 gemcitabine 800 mg/m added 08/06/2019-FUDR No. 2, 55 mg (dose reduced due to mild increase in bilirubin (, gemcitabine 800 mg/m and oxaliplatin 68 mg/m 08/20/2019-treatment continued, Decadron and pump continue due to mild increase in alkaline phosphatase 09/17/2019, FUDR No. 3 at 28 mg (dose reduced due to continued increase in alkaline phosphatase) 10/01/2019, CTs with continued response to therapy, decrease in liver disease 10/01/2019, therapy continued 10/15/2019, therapy continue with gemcitabine and oxaliplatin, FUDR restarted at 55 mg 11/12/2019-alkaline phosphatase elevated, FUDR held, Decadron given in hepatic infusion flush 11/26/2019 chemotherapy held secondary to elevated liver enzymes 11/26/2019-CT with continued improvement in the hepatic tumor burden 12/10/2019-chemotherapy restarted with gemcitabine/oxaliplatin 12/24/2019 chemotherapy continued, FUDR 28 mg 02/04/2020-oxaliplatin held secondary to worsening neuropathy 03/02/2020-CTs-stable disease Treatment continued with FUDR and  gemcitabine CTs 05/26/2020-unchanged treated lesions in the liver, unchanged left upper lobe pulmonary nodules, no evidence of disease progression 05/26/2020-recommendation to continue current therapy with gemcitabine plus FUDR (as labs allow) 06/09/2020-FUDR held 06/23/2020-FUDR held, continue gemcitabine, steroids given in HIA flush 07/21/2020 every 2-week Gemcitabine CTs at [facility] 08/18/2020-stable treated hepatic metastases, no evidence  of new or enlarging metastatic disease Every 2-week gemcitabine continued 08/31/2020 gemcitabine, Udenyca MRI 09/16/2020 at [facility]-irregularly shaped 1.9 cm lesion in hepatic segment 5 favored to represent posttreatment fibrosis.  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  Progressively increasing intrahepatic ductal dilatation over the past several exams. Family history of multiple cancers eluding breast, pancreatic, melanoma, appendiceal cancer, lung cancer Osteoporosis Neutropenia secondary to chemotherapy, Udenyca added with day 8 cycle 4      Disposition: Ms. Peeks appears stable.  The recent MRI shows a focal short segment severe stricture at the intrahepatic biliary confluence.  She is scheduled for ERCP by Dr. Rush Landmark next week.  We reviewed the CBC and chemistry panel from today.  She has mild to moderate neutropenia, bilirubin is higher.  We are holding today's planned chemotherapy.  She will contact the office with fever, chills, other signs of infection.  She will return for lab, follow-up, possible chemotherapy 09/30/2020.  She will contact the office in the interim as outlined above or with any other problems.  Patient seen with Dr. Luberta Robertson ANP/GNP-BC   09/22/2020  1:35 PM  This was a shared visit with Ned Card.  We reviewed the MRI results with Ms. Belay.  The liver enzymes and bilirubin are higher.  She has mild neutropenia today.  Chemotherapy will be held today.  She is  scheduled to undergo an ERCP on 09/26/2020.  We will see her after the ERCP for a repeat chemistry panel and decision on resuming gemcitabine.  I was present for greater than 50% of today's visit.  I performed medical decision making.  Julieanne Manson, MD

## 2020-09-22 NOTE — Patient Instructions (Signed)
Implanted Port Home Guide An implanted port is a device that is placed under the skin. It is usually placed in the chest. The device can be used to give IV medicine, to take blood, or for dialysis. You may have an implanted port if: You need IV medicine that would be irritating to the small veins in your hands or arms. You need IV medicines, such as antibiotics, for a long period of time. You need IV nutrition for a long period of time. You need dialysis. When you have a port, your health care provider can choose to use the port instead of veins in your arms for these procedures. You may have fewer limitations when using a port than you would if you used other types of long-term IVs, and you will likely be able to return to normal activities afteryour incision heals. An implanted port has two main parts: Reservoir. The reservoir is the part where a needle is inserted to give medicines or draw blood. The reservoir is round. After it is placed, it appears as a small, raised area under your skin. Catheter. The catheter is a thin, flexible tube that connects the reservoir to a vein. Medicine that is inserted into the reservoir goes into the catheter and then into the vein. How is my port accessed? To access your port: A numbing cream may be placed on the skin over the port site. Your health care provider will put on a mask and sterile gloves. The skin over your port will be cleaned carefully with a germ-killing soap and allowed to dry. Your health care provider will gently pinch the port and insert a needle into it. Your health care provider will check for a blood return to make sure the port is in the vein and is not clogged. If your port needs to remain accessed to get medicine continuously (constant infusion), your health care provider will place a clear bandage (dressing) over the needle site. The dressing and needle will need to be changed every week, or as told by your health care provider. What  is flushing? Flushing helps keep the port from getting clogged. Follow instructions from your health care provider about how and when to flush the port. Ports are usually flushed with saline solution or a medicine called heparin. The need for flushing will depend on how the port is used: If the port is only used from time to time to give medicines or draw blood, the port may need to be flushed: Before and after medicines have been given. Before and after blood has been drawn. As part of routine maintenance. Flushing may be recommended every 4-6 weeks. If a constant infusion is running, the port may not need to be flushed. Throw away any syringes in a disposal container that is meant for sharp items (sharps container). You can buy a sharps container from a pharmacy, or you can make one by using an empty hard plastic bottle with a cover. How long will my port stay implanted? The port can stay in for as long as your health care provider thinks it is needed. When it is time for the port to come out, a surgery will be done to remove it. The surgery will be similar to the procedure that was done to putthe port in. Follow these instructions at home:  Flush your port as told by your health care provider. If you need an infusion over several days, follow instructions from your health care provider about how to take   care of your port site. Make sure you: Wash your hands with soap and water before you change your dressing. If soap and water are not available, use alcohol-based hand sanitizer. Change your dressing as told by your health care provider. Place any used dressings or infusion bags into a plastic bag. Throw that bag in the trash. Keep the dressing that covers the needle clean and dry. Do not get it wet. Do not use scissors or sharp objects near the tube. Keep the tube clamped, unless it is being used. Check your port site every day for signs of infection. Check for: Redness, swelling, or  pain. Fluid or blood. Pus or a bad smell. Protect the skin around the port site. Avoid wearing bra straps that rub or irritate the site. Protect the skin around your port from seat belts. Place a soft pad over your chest if needed. Bathe or shower as told by your health care provider. The site may get wet as long as you are not actively receiving an infusion. Return to your normal activities as told by your health care provider. Ask your health care provider what activities are safe for you. Carry a medical alert card or wear a medical alert bracelet at all times. This will let health care providers know that you have an implanted port in case of an emergency. Get help right away if: You have redness, swelling, or pain at the port site. You have fluid or blood coming from your port site. You have pus or a bad smell coming from the port site. You have a fever. Summary Implanted ports are usually placed in the chest for long-term IV access. Follow instructions from your health care provider about flushing the port and changing bandages (dressings). Take care of the area around your port by avoiding clothing that puts pressure on the area, and by watching for signs of infection. Protect the skin around your port from seat belts. Place a soft pad over your chest if needed. Get help right away if you have a fever or you have redness, swelling, pain, drainage, or a bad smell at the port site. This information is not intended to replace advice given to you by your health care provider. Make sure you discuss any questions you have with your healthcare provider. Document Revised: 06/15/2019 Document Reviewed: 06/15/2019 Elsevier Patient Education  2022 [employer].  

## 2020-09-23 ENCOUNTER — Encounter: Payer: Self-pay | Admitting: Oncology

## 2020-09-26 ENCOUNTER — Encounter (HOSPITAL_COMMUNITY)
Admission: RE | Disposition: A | Discharge status: Home / Self Care | Payer: Self-pay | Source: Ambulatory Visit | Attending: Gastroenterology

## 2020-09-26 ENCOUNTER — Other Ambulatory Visit: Payer: Self-pay

## 2020-09-26 ENCOUNTER — Ambulatory Visit (HOSPITAL_COMMUNITY)
Admission: RE | Admit: 2020-09-26 | Discharge: 2020-09-26 | Disposition: A | Discharge status: Home / Self Care | Payer: No Typology Code available for payment source | Source: Ambulatory Visit | Attending: Gastroenterology | Admitting: Gastroenterology

## 2020-09-26 ENCOUNTER — Ambulatory Visit (HOSPITAL_COMMUNITY): Payer: No Typology Code available for payment source | Admitting: Anesthesiology

## 2020-09-26 ENCOUNTER — Encounter (HOSPITAL_COMMUNITY): Payer: Self-pay | Admitting: Gastroenterology

## 2020-09-26 ENCOUNTER — Ambulatory Visit (HOSPITAL_COMMUNITY): Payer: No Typology Code available for payment source

## 2020-09-26 DIAGNOSIS — K831 Obstruction of bile duct: Secondary | ICD-10-CM | POA: Insufficient documentation

## 2020-09-26 DIAGNOSIS — Z8509 Personal history of malignant neoplasm of other digestive organs: Secondary | ICD-10-CM | POA: Diagnosis present

## 2020-09-26 DIAGNOSIS — R945 Abnormal results of liver function studies: Secondary | ICD-10-CM | POA: Diagnosis not present

## 2020-09-26 DIAGNOSIS — K3189 Other diseases of stomach and duodenum: Secondary | ICD-10-CM | POA: Insufficient documentation

## 2020-09-26 DIAGNOSIS — R17 Unspecified jaundice: Secondary | ICD-10-CM

## 2020-09-26 DIAGNOSIS — K838 Other specified diseases of biliary tract: Secondary | ICD-10-CM | POA: Diagnosis not present

## 2020-09-26 DIAGNOSIS — K2289 Other specified disease of esophagus: Secondary | ICD-10-CM | POA: Insufficient documentation

## 2020-09-26 DIAGNOSIS — Z419 Encounter for procedure for purposes other than remedying health state, unspecified: Secondary | ICD-10-CM

## 2020-09-26 DIAGNOSIS — C221 Intrahepatic bile duct carcinoma: Secondary | ICD-10-CM | POA: Diagnosis not present

## 2020-09-26 DIAGNOSIS — R7989 Other specified abnormal findings of blood chemistry: Secondary | ICD-10-CM

## 2020-09-26 HISTORY — PX: SPHINCTEROTOMY: SHX5279

## 2020-09-26 HISTORY — PX: SPYGLASS CHOLANGIOSCOPY: SHX5441

## 2020-09-26 HISTORY — PX: BIOPSY: SHX5522

## 2020-09-26 HISTORY — PX: ENDOSCOPIC RETROGRADE CHOLANGIOPANCREATOGRAPHY (ERCP) WITH PROPOFOL: SHX5810

## 2020-09-26 SURGERY — ENDOSCOPIC RETROGRADE CHOLANGIOPANCREATOGRAPHY (ERCP) WITH PROPOFOL
Anesthesia: General

## 2020-09-26 MED ORDER — CIPROFLOXACIN IN D5W 400 MG/200ML IV SOLN
INTRAVENOUS | Status: AC
  Filled 2020-09-26: qty 200

## 2020-09-26 MED ORDER — MIDAZOLAM HCL 2 MG/2ML IJ SOLN
INTRAMUSCULAR | Status: DC | PRN
  Administered 2020-09-26: 2 mg via INTRAVENOUS

## 2020-09-26 MED ORDER — FENTANYL CITRATE (PF) 250 MCG/5ML IJ SOLN
INTRAMUSCULAR | Status: AC
  Filled 2020-09-26: qty 5

## 2020-09-26 MED ORDER — LIDOCAINE 2% (20 MG/ML) 5 ML SYRINGE
INTRAMUSCULAR | Status: DC | PRN
  Administered 2020-09-26: 60 mg via INTRAVENOUS

## 2020-09-26 MED ORDER — LACTATED RINGERS IV SOLN: INTRAVENOUS | Status: DC

## 2020-09-26 MED ORDER — GLUCAGON HCL RDNA (DIAGNOSTIC) 1 MG IJ SOLR
INTRAMUSCULAR | Status: DC | PRN
  Administered 2020-09-26 (×2): .25 mg via INTRAVENOUS

## 2020-09-26 MED ORDER — INDOMETHACIN 50 MG RE SUPP
RECTAL | Status: DC | PRN
  Administered 2020-09-26: 100 mg via RECTAL

## 2020-09-26 MED ORDER — ONDANSETRON HCL 4 MG/2ML IJ SOLN
INTRAMUSCULAR | Status: DC | PRN
  Administered 2020-09-26: 4 mg via INTRAVENOUS

## 2020-09-26 MED ORDER — GLUCAGON HCL RDNA (DIAGNOSTIC) 1 MG IJ SOLR
INTRAMUSCULAR | Status: AC
  Filled 2020-09-26: qty 1

## 2020-09-26 MED ORDER — SODIUM CHLORIDE 0.9 % IV SOLN: INTRAVENOUS | Status: DC

## 2020-09-26 MED ORDER — ROCURONIUM BROMIDE 10 MG/ML (PF) SYRINGE
PREFILLED_SYRINGE | INTRAVENOUS | Status: DC | PRN
  Administered 2020-09-26: 30 mg via INTRAVENOUS
  Administered 2020-09-26: 10 mg via INTRAVENOUS

## 2020-09-26 MED ORDER — DEXAMETHASONE SODIUM PHOSPHATE 10 MG/ML IJ SOLN
INTRAMUSCULAR | Status: DC | PRN
  Administered 2020-09-26: 4 mg via INTRAVENOUS

## 2020-09-26 MED ORDER — FENTANYL CITRATE (PF) 250 MCG/5ML IJ SOLN
INTRAMUSCULAR | Status: DC | PRN
  Administered 2020-09-26: 50 ug via INTRAVENOUS
  Administered 2020-09-26: 100 ug via INTRAVENOUS

## 2020-09-26 MED ORDER — PROPOFOL 10 MG/ML IV BOLUS
INTRAVENOUS | Status: DC | PRN
  Administered 2020-09-26: 100 mg via INTRAVENOUS

## 2020-09-26 MED ORDER — INDOMETHACIN 50 MG RE SUPP
RECTAL | Status: AC
  Filled 2020-09-26: qty 2

## 2020-09-26 MED ORDER — CIPROFLOXACIN HCL 500 MG PO TABS: 500.0000 mg | ORAL_TABLET | Freq: Two times a day (BID) | ORAL | 0 refills | Status: AC

## 2020-09-26 MED ORDER — SODIUM CHLORIDE 0.9 % IV SOLN
INTRAVENOUS | Status: DC | PRN
  Administered 2020-09-26: 25 mL

## 2020-09-26 MED ORDER — SUGAMMADEX SODIUM 200 MG/2ML IV SOLN
INTRAVENOUS | Status: DC | PRN
  Administered 2020-09-26: 125 mg via INTRAVENOUS

## 2020-09-26 MED ORDER — CIPROFLOXACIN IN D5W 400 MG/200ML IV SOLN
400.0000 mg | Freq: Once | INTRAVENOUS | Status: AC
  Administered 2020-09-26: 400 mg via INTRAVENOUS

## 2020-09-26 MED ORDER — MIDAZOLAM HCL 2 MG/2ML IJ SOLN
INTRAMUSCULAR | Status: AC
  Filled 2020-09-26: qty 2

## 2020-09-26 NOTE — Transfer of Care (Signed)
Immediate Anesthesia Transfer of Care Note  Patient: Angela Fleming  Procedure(s) Performed: ENDOSCOPIC RETROGRADE CHOLANGIOPANCREATOGRAPHY (ERCP) WITH PROPOFOL  Patient Location: PACU and Endoscopy Unit  Anesthesia Type:General  Level of Consciousness: awake, drowsy and responds to stimulation  Airway & Oxygen Therapy: Patient Spontanous Breathing and Patient connected to face mask oxygen  Post-op Assessment: Report given to RN and Post -op Vital signs reviewed and stable  Post vital signs: Reviewed and stable  Last Vitals:  Vitals Value Taken Time  BP    Temp    Pulse 76 09/26/20 1224  Resp 11 09/26/20 1224  SpO2 100 % 09/26/20 1224  Vitals shown include unvalidated device data.  Last Pain:  Vitals:   09/26/20 0943  TempSrc: Oral  PainSc: 0-No pain         Complications: No notable events documented.

## 2020-09-26 NOTE — Anesthesia Postprocedure Evaluation (Signed)
Anesthesia Post Note  Patient: Lori Ambrosini  Procedure(s) Performed: ENDOSCOPIC RETROGRADE CHOLANGIOPANCREATOGRAPHY (ERCP) WITH PROPOFOL SPHINCTEROTOMY SPYGLASS CHOLANGIOSCOPY BIOPSY     Patient location during evaluation: PACU Anesthesia Type: General Level of consciousness: awake and alert Pain management: pain level controlled Vital Signs Assessment: post-procedure vital signs reviewed and stable Respiratory status: spontaneous breathing, nonlabored ventilation, respiratory function stable and patient connected to nasal cannula oxygen Cardiovascular status: blood pressure returned to baseline and stable Postop Assessment: no apparent nausea or vomiting Anesthetic complications: no   No notable events documented.  Last Vitals:  Vitals:   09/26/20 1300 09/26/20 1310  BP: (!) 150/83 (!) 154/70  Pulse: 65 67  Resp: 18 14  Temp:    SpO2: 100% 100%    Last Pain:  Vitals:   09/26/20 1250  TempSrc:   PainSc: (P) 0-No pain                 Tiajuana Amass

## 2020-09-26 NOTE — Op Note (Addendum)
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 Zenia Resides CRNA,                            CRNA Referring MD:             Izola Price. Sherrill, Karmen Stabs, MD Medicines:                General Anesthesia, Cipro 400 mg IV, Indomethacin                            100 mg PR, Glucagon 2.68 mg IV Complications:            No immediate complications. Estimated Blood Loss:     Estimated blood loss was minimal. Procedure:                Pre-Anesthesia Assessment:                           - Prior to the procedure, a History and Physical                            was performed, and patient medications and                            allergies were reviewed. The patient's tolerance of                            previous anesthesia was also reviewed. The risks                            and benefits of the procedure and the sedation                            options and risks were discussed with the patient.  All questions were answered, and informed consent                            was obtained. Prior Anticoagulants: The patient has                            taken no previous anticoagulant or antiplatelet                            agents. ASA Grade Assessment: II - A patient with                            mild systemic disease. After reviewing the risks                             and benefits, the patient was deemed in                            satisfactory condition to undergo the procedure.                           After obtaining informed consent, the scope was                            passed under direct vision. Throughout the                            procedure, the patient's blood pressure, pulse, and                            oxygen saturations were monitored continuously. The                            TJF-Q180V ([id number] Olympus duodenoscope was                            introduced through the mouth, and used to inject                            contrast into and used to inject contrast into the                            bile duct. The ERCP was technically difficult and                            complex due to difficulty passing guidewires                            through biliary ductal stenosis. Successful                            completion of the procedure was aided by performing  the maneuvers documented (below) in this report.                            The patient tolerated the procedure. Scope In: Scope Out: Findings:      A scout film of the abdomen was obtained. One subhepatic pump with       catetheter was noted in the right upper quadrant was seen.      The esophagus was successfully intubated under direct vision without       detailed examination of the pharynx, larynx, and associated structures.       The upper GI tract was traversed under direct vision without detailed       examination. The Z-line was irregular and was found 41 cm from the       incisors. Patchy mildly erythematous mucosa without bleeding was found       in the entire examined stomach - this was biopsied for HP evaluation. No       gross lesions were noted in the duodenal bulb, in the first portion of       the duodenum and in the second portion of the duodenum. The major       papilla was normal.      A short 0.035 inch Soft  Jagwire was passed into the biliary tree. The       Hydratome sphincterotome was passed over the guidewire and the bile duct       was then deeply cannulated. Contrast was injected. I personally       interpreted the bile duct images. Opacification of the hepatic duct       bifurcation was successful. The lower third of the main bile duct and       middle third of the main bile duct were normal. The common hepatic duct       and hepatic duct bifurcation contained a single severe stenosis at least       15 mm in length. The right and left intrahepatic branches seemed       dilated, but significant injection of contrast was not performed due to       the hilar nature of today's work. I could not get the wire to advance       through the stricture. I switched over to a short 0.025 inch Revolution       Angled Jagwire but still had difficulty entering/accessing the proximal       biliary tree. A 7 mm biliary sphincterotomy was made with a monofilament       Hydratome sphincterotome using ERBE electrocautery. There was no       post-sphincterotomy bleeding.      Due to inability to attain proximal biliary cannulation, the bile duct       was explored endoscopically using the SpyGlass direct visualization       system. The SpyScope was advanced to the upper third of the main bile       duct. Visibility with the scope was good. The majority of the bile duct       was normal. The common hepatic duct and presumed near hepatic duct       bifurcation contained a single severe stenosis approximately 1-2 mm in       size. The bile duct could not be cannulated. I tried angled and straight       450 cm 0.035 and 0.025  wires without success. After more than an hour of       attempting proximal cannulation, decision made to end procedure.      A pancreatogram was not performed.      The duodenoscope was withdrawn from the patient. Impression:               - Z-line irregular, 41 cm from the incisors.                            - Erythematous mucosa in the stomach. Biopsied.                           - No gross lesions in the duodenal bulb, in the                            first portion of the duodenum and in the second                            portion of the duodenum.                           - The major papilla appeared normal.                           - A biliary sphincterotomy was performed.                           - A single severe biliary stricture was found in                            the common hepatic duct. The stricture was                            indeterminate.                           - The right and left intrahepatic branches seemed                            to be dilated but occlusion cholangiogram was not                            performed due to the hilar work today.                           - Attempted super-selective biliary cannulation via                            Spyglass, but only was able to get the Spyscope to                            the CHD region and note the very tight stricture.  Multiple wires as noted above would not allow                            crossing into the left/right hepatic systems. Moderate Sedation:      Not Applicable - Patient had care per Anesthesia. Recommendation:           - The patient will be observed post-procedure,                            until all discharge criteria are met.                           - Discharge patient to home.                           - Patient has a contact number available for                            emergencies. The signs and symptoms of potential                            delayed complications were discussed with the                            patient. Return to normal activities tomorrow.                            Written discharge instructions were provided to the                            patient.                           - Low fat diet.                            - Observe patient's clinical course.                           - Ciprofloxacin 500 mg BID x 5-days to decrease                            risk of post-infectious complications.                           - Check liver enzymes (AST, ALT, alkaline                            phosphatase, bilirubin) in 1 week.                           - Discuss case with patient/family as well as                            referring Oncology team. It may not be unreasonable  for an attempt at upper biliary tract cannulation                            attempt via cholangioscopy 1 more time at a                            [facility] before PBD placement, but will                            see what they would like to do. Her care is at [facility]                            from an Oncology perspective so we can try to                            expedite a repeat attempt if desired.                           - The findings and recommendations were discussed                            with the patient.                           - The findings and recommendations were discussed                            with the patient's family. Procedure Code(s):        --- Professional ---                           [phone], Endoscopic retrograde                            cholangiopancreatography (ERCP); with                            sphincterotomy/papillotomy                           43261, Endoscopic retrograde                            cholangiopancreatography (ERCP); with biopsy,                            single or multiple                           43273, Endoscopic cannulation of papilla with                            direct visualization of pancreatic/common bile                            duct(s) (List separately in addition to code(s) for  primary procedure) Diagnosis Code(s):        --- Professional ---                           K22.8, Other specified  diseases of esophagus                           K31.89, Other diseases of stomach and duodenum                           K83.1, Obstruction of bile duct                           R94.5, Abnormal results of liver function studies                           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 All rights reserved. The codes documented in this report are preliminary and upon coder review may  be revised to meet current compliance requirements. [name], MD 09/26/2020 12:38:42 PM Number of Addenda: 0

## 2020-09-26 NOTE — Discharge Instructions (Signed)
YOU HAD AN ENDOSCOPIC PROCEDURE TODAY: Refer to the procedure report and other information in the discharge instructions given to you for any specific questions about what was found during the examination. If this information does not answer your questions, please call [place] office at [phone] to clarify.   YOU SHOULD EXPECT: Some feelings of bloating in the abdomen. Passage of more gas than usual. Walking can help get rid of the air that was put into your GI tract during the procedure and reduce the bloating. If you had a lower endoscopy (such as a colonoscopy or flexible sigmoidoscopy) you may notice spotting of blood in your stool or on the toilet paper. Some abdominal soreness may be present for a day or two, also.  DIET: Your first meal following the procedure should be a light meal and then it is ok to progress to your normal diet. A half-sandwich or bowl of soup is an example of a good first meal. Heavy or fried foods are harder to digest and may make you feel nauseous or bloated. Drink plenty of fluids but you should avoid alcoholic beverages for 24 hours. If you had a esophageal dilation, please see attached instructions for diet.    ACTIVITY: Your care partner should take you home directly after the procedure. You should plan to take it easy, moving slowly for the rest of the day. You can resume normal activity the day after the procedure however YOU SHOULD NOT DRIVE, use power tools, machinery or perform tasks that involve climbing or major physical exertion for 24 hours (because of the sedation medicines used during the test).   SYMPTOMS TO REPORT IMMEDIATELY: A gastroenterologist can be reached at any hour. Please call [phone]  for any of the following symptoms:   Following upper endoscopy (EGD, EUS, ERCP, esophageal dilation) Vomiting of blood or coffee ground material  New, significant abdominal pain  New, significant chest pain or pain under the shoulder blades  Painful or  persistently difficult swallowing  New shortness of breath  Black, tarry-looking or red, bloody stools  FOLLOW UP:  If any biopsies were taken you will be contacted by phone or by letter within the next 1-3 weeks. Call [phone]  if you have not heard about the biopsies in 3 weeks.  Please also call with any specific questions about appointments or follow up tests.  

## 2020-09-26 NOTE — Anesthesia Preprocedure Evaluation (Signed)
Anesthesia Evaluation  Patient identified by MRN, date of birth, ID band Patient awake    Reviewed: Allergy & Precautions, NPO status , Patient's Chart, lab work & pertinent test results  Airway Mallampati: II  TM Distance: >3 FB Neck ROM: Full    Dental  (+) Dental Advisory Given   Pulmonary neg pulmonary ROS,    breath sounds clear to auscultation       Cardiovascular negative cardio ROS   Rhythm:Regular Rate:Normal     Neuro/Psych  Neuromuscular disease    GI/Hepatic GERD  ,Cholangiocarcinoma   Endo/Other  negative endocrine ROS  Renal/GU negative Renal ROS     Musculoskeletal   Abdominal   Peds  Hematology negative hematology ROS (+)   Anesthesia Other Findings   Reproductive/Obstetrics                             Anesthesia Physical Anesthesia Plan  ASA: 2  Anesthesia Plan: General   Post-op Pain Management:    Induction: Intravenous  PONV Risk Score and Plan: 3 and Ondansetron, Treatment may vary due to age or medical condition, Dexamethasone and Midazolam  Airway Management Planned: Oral ETT  Additional Equipment: None  Intra-op Plan:   Post-operative Plan: Extubation in OR  Informed Consent: I have reviewed the patients History and Physical, chart, labs and discussed the procedure including the risks, benefits and alternatives for the proposed anesthesia with the patient or authorized representative who has indicated his/her understanding and acceptance.     Dental advisory given  Plan Discussed with: CRNA  Anesthesia Plan Comments:         Anesthesia Quick Evaluation

## 2020-09-26 NOTE — Anesthesia Procedure Notes (Signed)
Procedure Name: Intubation Date/Time: 09/26/2020 10:47 AM Performed by: Lollie Sails, CRNA Pre-anesthesia Checklist: Patient identified, Emergency Drugs available, Suction available, Patient being monitored and Timeout performed Patient Re-evaluated:Patient Re-evaluated prior to induction Oxygen Delivery Method: Circle system utilized Preoxygenation: Pre-oxygenation with 100% oxygen Induction Type: IV induction Ventilation: Mask ventilation without difficulty Laryngoscope Size: Miller and 3 Tube type: Oral Tube size: 7.0 mm Number of attempts: 1 Airway Equipment and Method: Stylet Placement Confirmation: ETT inserted through vocal cords under direct vision, positive ETCO2 and breath sounds checked- equal and bilateral Secured at: 22 cm Tube secured with: Tape Dental Injury: Teeth and Oropharynx as per pre-operative assessment

## 2020-09-26 NOTE — H&P (Signed)
GASTROENTEROLOGY PROCEDURE H&P NOTE   Primary Care Physician: Angela Manes, MD  HPI: Angela Fleming is a 65 y.o. female who presents for ERCP attempt at evaluation of abnormal LFTs, pruritus, and known cholangiocarcinoma s/p treatment with narrowing in the left hepatic system.  Past Medical History:  Diagnosis Date   ALLERGIC RHINITIS 01/31/2010   Anxiety state, unspecified 03/16/2009   CHANGE IN BOWELS 01/31/2010   CHICKENPOX, HX OF 03/16/2009   Family history of breast cancer    Family history of colon cancer    Family history of lung cancer    Family history of melanoma    Family history of pancreatic cancer    GERD 03/16/2009   UNSPECIFIED MYALGIA AND MYOSITIS 01/31/2010   Past Surgical History:  Procedure Laterality Date   ABDOMINAL HYSTERECTOMY  1992   BREAST BIOPSY  1979   bilat   TONSILLECTOMY     Current Facility-Administered Medications  Medication Dose Route Frequency Provider Last Rate Last Admin   0.9 %  sodium chloride infusion   Intravenous Continuous Mansouraty, Telford Nab., MD       lactated ringers infusion   Intravenous Continuous Mansouraty, Telford Nab., MD 10 mL/hr at 09/26/20 1002 New Bag at 09/26/20 1002    Current Facility-Administered Medications:    0.9 %  sodium chloride infusion, , Intravenous, Continuous, Mansouraty, Telford Nab., MD   lactated ringers infusion, , Intravenous, Continuous, Mansouraty, Telford Nab., MD, Last Rate: 10 mL/hr at 09/26/20 1002, New Bag at 09/26/20 1002 No Known Allergies Family History  Problem Relation Age of Onset   Arthritis Other    Diabetes Other    Hypertension Other    Cancer Other        breast, colon and lung cancer   Heart disease Other    Rheum arthritis Maternal Grandmother    Lupus Maternal Grandmother    Breast cancer Sister 48       negative genetic testing   Melanoma Sister        dx. in her 7s   Breast cancer Maternal Aunt        dx. late 50s/60s, 2 breast cancers in same breast, negative  genetic testing   Cancer Paternal Uncle 83       appendix   Stroke Maternal Grandfather    Lung cancer Paternal Grandmother 79       may have started as bresat cancer   Colon cancer Paternal Grandfather        diagnosed in his early 31s   Pancreatic cancer Maternal Aunt 74   Melanoma Maternal Aunt    Social History   Socioeconomic History   Marital status: Married    Spouse name: Not on file   Number of children: Not on file   Years of education: Not on file   Highest education level: Not on file  Occupational History   Occupation: Therapist, sports     Employer: [employer]  Tobacco Use   Smoking status: Never   Smokeless tobacco: Never  Substance and Sexual Activity   Alcohol use: Never   Drug use: Never   Sexual activity: Not on file  Other Topics Concern   Not on file  Social History Narrative   Not on file   Social Determinants of Health   Financial Resource Strain: Not on file  Food Insecurity: Not on file  Transportation Needs: Not on file  Physical Activity: Not on file  Stress: Not on file  Social Connections: Not on file  Intimate Partner Violence: Not on file    Physical Exam: Vital signs in last 24 hours: Temp:  [98.2 F (36.8 C)] 98.2 F (36.8 C) (08/15 0943) Pulse Rate:  [78] 78 (08/15 0943) Resp:  [19] 19 (08/15 0943) BP: (127)/(70) 127/70 (08/15 0943) SpO2:  [99 %] 99 % (08/15 0943) Weight:  [58.5 kg] 58.5 kg (08/15 0943)   GEN: NAD EYE: Sclerae anicteric ENT: MMM CV: Non-tachycardic GI: Soft, NT/ND NEURO:  Alert & Oriented x 3  Lab Results: No results for input(s): WBC, HGB, HCT, PLT in the last 72 hours. BMET No results for input(s): NA, K, CL, CO2, GLUCOSE, BUN, CREATININE, CALCIUM in the last 72 hours. LFT No results for input(s): PROT, ALBUMIN, AST, ALT, ALKPHOS, BILITOT, BILIDIR, IBILI in the last 72 hours. PT/INR No results for input(s): LABPROT, INR in the last 72 hours.   Impression / Plan: This is a 65 y.o.female who presents  for ERCP attempt at evaluation of abnormal LFTs, pruritus, and known cholangiocarcinoma s/p treatment with narrowing in the left hepatic system.  The risks of an ERCP were discussed at length, including but not limited to the risk of perforation, bleeding, abdominal pain, post-ERCP pancreatitis (while usually mild can be severe and even life threatening).  The risks and benefits of endoscopic evaluation/treatment were discussed with the patient and/or family; these include but are not limited to the risk of perforation, infection, bleeding, missed lesions, lack of diagnosis, severe illness requiring hospitalization, as well as anesthesia and sedation related illnesses.  The patient's history has been reviewed, patient examined, no change in status, and deemed stable for procedure.  The patient and/or family is agreeable to proceed.    [name], MD [facility] # [phone]

## 2020-09-27 ENCOUNTER — Telehealth: Payer: Self-pay | Admitting: Gastroenterology

## 2020-09-27 ENCOUNTER — Encounter (HOSPITAL_COMMUNITY): Payer: Self-pay | Admitting: Gastroenterology

## 2020-09-27 LAB — SURGICAL PATHOLOGY

## 2020-09-28 ENCOUNTER — Encounter: Payer: Self-pay | Admitting: Gastroenterology

## 2020-09-28 NOTE — Telephone Encounter (Addendum)
I called and spoke with the patient on 8/16 p.m. Patient has been in contact with Dr. Fanny Skates on Tuesday. Dr. Fanny Skates has recommended 1 more attempt at potential ERCP before PBD placement. She is working on arranging this with [facility]. I had spoken with [facility] on 8/15 in the afternoon briefly about the patient's case in case Dr. Fanny Skates wanted them to see about potentially retrial. Pruritus remains similar. She has no evidence of post ERCP pancreatitis or other complications based on how she is doing. She is taking her antibiotics. I will forward this telephone note to the patient's [facility] oncology team so they are aware of the next steps in her evaluation/treatment. She will be in touch with [place] if there are any issues with getting set up for her ERCP next week with [facility].   [name], MD [facility] # [phone]

## 2020-09-29 ENCOUNTER — Inpatient Hospital Stay: Payer: No Typology Code available for payment source

## 2020-09-30 ENCOUNTER — Inpatient Hospital Stay: Payer: No Typology Code available for payment source

## 2020-09-30 ENCOUNTER — Other Ambulatory Visit: Payer: Self-pay

## 2020-09-30 DIAGNOSIS — Z95828 Presence of other vascular implants and grafts: Secondary | ICD-10-CM

## 2020-09-30 DIAGNOSIS — C221 Intrahepatic bile duct carcinoma: Secondary | ICD-10-CM | POA: Diagnosis not present

## 2020-09-30 LAB — CBC WITH DIFFERENTIAL (CANCER CENTER ONLY)
Abs Immature Granulocytes: 0.02 10*3/uL (ref 0.00–0.07)
Basophils Absolute: 0.1 10*3/uL (ref 0.0–0.1)
Basophils Relative: 2 %
Eosinophils Absolute: 0.1 10*3/uL (ref 0.0–0.5)
Eosinophils Relative: 4 %
HCT: 39.5 % (ref 36.0–46.0)
Hemoglobin: 12.6 g/dL (ref 12.0–15.0)
Immature Granulocytes: 1 %
Lymphocytes Relative: 33 %
Lymphs Abs: 1.1 10*3/uL (ref 0.7–4.0)
MCH: 29.1 pg (ref 26.0–34.0)
MCHC: 31.9 g/dL (ref 30.0–36.0)
MCV: 91.2 fL (ref 80.0–100.0)
Monocytes Absolute: 0.5 10*3/uL (ref 0.1–1.0)
Monocytes Relative: 14 %
Neutro Abs: 1.5 10*3/uL — ABNORMAL LOW (ref 1.7–7.7)
Neutrophils Relative %: 46 %
Platelet Count: 223 10*3/uL (ref 150–400)
RBC: 4.33 MIL/uL (ref 3.87–5.11)
RDW: 14.6 % (ref 11.5–15.5)
WBC Count: 3.3 10*3/uL — ABNORMAL LOW (ref 4.0–10.5)
nRBC: 0 % (ref 0.0–0.2)

## 2020-09-30 LAB — CMP (CANCER CENTER ONLY)
ALT: 116 U/L — ABNORMAL HIGH (ref 0–44)
AST: 79 U/L — ABNORMAL HIGH (ref 15–41)
Albumin: 3.8 g/dL (ref 3.5–5.0)
Alkaline Phosphatase: 281 U/L — ABNORMAL HIGH (ref 38–126)
Anion gap: 9 (ref 5–15)
BUN: 13 mg/dL (ref 8–23)
CO2: 27 mmol/L (ref 22–32)
Calcium: 9.5 mg/dL (ref 8.9–10.3)
Chloride: 102 mmol/L (ref 98–111)
Creatinine: 0.7 mg/dL (ref 0.44–1.00)
GFR, Estimated: >60 mL/min (ref >60)
Glucose, Bld: 113 mg/dL — ABNORMAL HIGH (ref 70–99)
Potassium: 4.1 mmol/L (ref 3.5–5.1)
Sodium: 138 mmol/L (ref 135–145)
Total Bilirubin: 2.4 mg/dL — ABNORMAL HIGH (ref 0.3–1.2)
Total Protein: 6 g/dL — ABNORMAL LOW (ref 6.5–8.1)

## 2020-09-30 MED ORDER — HEPARIN SODIUM (PORCINE) 5000 UNIT/ML IJ SOLN
Freq: Once | INTRAMUSCULAR | Status: AC
  Filled 2020-09-30: qty 5

## 2020-09-30 NOTE — Patient Instructions (Signed)
F/U as scheduled.

## 2020-09-30 NOTE — Progress Notes (Signed)
Accessed Medtronic pump left upper abdomen using sterile technique without difficulty. Aspirated 2.6 ml of clear fluid (confirmed w/ipad). Instilled slowly aspirating ever 3 ml 25,000 units heparin in 20 ml NS without difficulty. Confirmed next appointment is 10/27/20 before it is due on 10/29/20. Discharged in good condition.

## 2020-09-30 NOTE — Progress Notes (Signed)
Patient states ERCP was not successful on 09/26/20 and will be going back to repeat procedure on 10/03/20.  Per Ned Card, PA, no chemo today.

## 2020-10-04 ENCOUNTER — Encounter: Payer: Self-pay | Admitting: Oncology

## 2020-10-04 ENCOUNTER — Telehealth: Payer: Self-pay

## 2020-10-04 NOTE — Telephone Encounter (Signed)
pt sent message that ERCp was succesful and wondered if chemo could be moved to this week.  OK for Friday 10/07/20 1200 labs and 1300 chemo.  pt aware

## 2020-10-07 ENCOUNTER — Ambulatory Visit: Payer: No Typology Code available for payment source

## 2020-10-07 ENCOUNTER — Inpatient Hospital Stay: Payer: No Typology Code available for payment source

## 2020-10-07 ENCOUNTER — Other Ambulatory Visit: Payer: Self-pay

## 2020-10-07 ENCOUNTER — Other Ambulatory Visit: Payer: No Typology Code available for payment source

## 2020-10-07 ENCOUNTER — Inpatient Hospital Stay (HOSPITAL_BASED_OUTPATIENT_CLINIC_OR_DEPARTMENT_OTHER): Payer: No Typology Code available for payment source | Admitting: Nurse Practitioner

## 2020-10-07 ENCOUNTER — Encounter: Payer: Self-pay | Admitting: Oncology

## 2020-10-07 VITALS — BP 123/79 | HR 83 | Temp 97.8°F | Resp 20 | Ht 64.0 in | Wt 127.8 lb

## 2020-10-07 DIAGNOSIS — Z95828 Presence of other vascular implants and grafts: Secondary | ICD-10-CM

## 2020-10-07 DIAGNOSIS — C221 Intrahepatic bile duct carcinoma: Secondary | ICD-10-CM | POA: Diagnosis not present

## 2020-10-07 LAB — CMP (CANCER CENTER ONLY)
ALT: 77 U/L — ABNORMAL HIGH (ref 0–44)
AST: 47 U/L — ABNORMAL HIGH (ref 15–41)
Albumin: 3.8 g/dL (ref 3.5–5.0)
Alkaline Phosphatase: 221 U/L — ABNORMAL HIGH (ref 38–126)
Anion gap: 9 (ref 5–15)
BUN: 15 mg/dL (ref 8–23)
CO2: 26 mmol/L (ref 22–32)
Calcium: 9.2 mg/dL (ref 8.9–10.3)
Chloride: 101 mmol/L (ref 98–111)
Creatinine: 0.52 mg/dL (ref 0.44–1.00)
GFR, Estimated: >60 mL/min (ref >60)
Glucose, Bld: 122 mg/dL — ABNORMAL HIGH (ref 70–99)
Potassium: 3.8 mmol/L (ref 3.5–5.1)
Sodium: 136 mmol/L (ref 135–145)
Total Bilirubin: 2 mg/dL — ABNORMAL HIGH (ref 0.3–1.2)
Total Protein: 6.3 g/dL — ABNORMAL LOW (ref 6.5–8.1)

## 2020-10-07 LAB — CBC WITH DIFFERENTIAL (CANCER CENTER ONLY)
Abs Immature Granulocytes: 0.02 10*3/uL (ref 0.00–0.07)
Basophils Absolute: 0.1 10*3/uL (ref 0.0–0.1)
Basophils Relative: 1 %
Eosinophils Absolute: 0.1 10*3/uL (ref 0.0–0.5)
Eosinophils Relative: 3 %
HCT: 37.9 % (ref 36.0–46.0)
Hemoglobin: 12.2 g/dL (ref 12.0–15.0)
Immature Granulocytes: 0 %
Lymphocytes Relative: 24 %
Lymphs Abs: 1.2 10*3/uL (ref 0.7–4.0)
MCH: 29 pg (ref 26.0–34.0)
MCHC: 32.2 g/dL (ref 30.0–36.0)
MCV: 90.2 fL (ref 80.0–100.0)
Monocytes Absolute: 0.5 10*3/uL (ref 0.1–1.0)
Monocytes Relative: 11 %
Neutro Abs: 3 10*3/uL (ref 1.7–7.7)
Neutrophils Relative %: 61 %
Platelet Count: 185 10*3/uL (ref 150–400)
RBC: 4.2 MIL/uL (ref 3.87–5.11)
RDW: 14.1 % (ref 11.5–15.5)
WBC Count: 5 10*3/uL (ref 4.0–10.5)
nRBC: 0 % (ref 0.0–0.2)

## 2020-10-07 MED ORDER — SODIUM CHLORIDE 0.9% FLUSH
10.0000 mL | INTRAVENOUS | Status: DC | PRN
  Administered 2020-10-07: 10 mL

## 2020-10-07 MED ORDER — HEPARIN SOD (PORK) LOCK FLUSH 100 UNIT/ML IV SOLN
500.0000 [IU] | Freq: Once | INTRAVENOUS | Status: AC | PRN
  Administered 2020-10-07: 500 [IU]

## 2020-10-07 NOTE — Progress Notes (Signed)
Angela OFFICE PROGRESS NOTE   Diagnosis: Fleming  INTERVAL HISTORY:   Angela Fleming returns as scheduled.  She underwent an ERCP procedure at [facility] 10/03/2020.  A single severe localized biliary stricture was found in the common hepatic duct.  The stricture was fibrotic.  The left and right hepatic ducts and all intrahepatic branches were moderately dilated.  The upper third of the main bile duct was successfully dilated.  Plastic biliary stent was placed into the common bile duct.  Brushings were obtained in the upper third of the main bile duct-no evidence of malignancy.  She continues to have itching.  She has mild nausea.  No diarrhea.  No mouth sores.  Objective:  Vital signs in last 24 hours:  Blood pressure 123/79, pulse 83, temperature 97.8 F (36.6 C), temperature source Oral, resp. rate 20, height 5' 4"  (1.626 m), weight 127 lb 12.8 oz (58 kg), SpO2 100 %.    Resp: Lungs clear bilaterally. Cardio: Regular rate and rhythm. GI: Hepatic infusion pump left abdomen.  No hepatomegaly. Vascular: No leg edema. Skin: Small raised erythematous/scabbed skin lesions anterior chest wall, mid back. Port-A-Cath without erythema.  Lab Results:  Lab Results  Component Value Date   WBC 5.0 10/07/2020   HGB 12.2 10/07/2020   HCT 37.9 10/07/2020   MCV 90.2 10/07/2020   PLT 185 10/07/2020   NEUTROABS 3.0 10/07/2020    Imaging:  No results found.  Medications: I have reviewed the patient's current medications.  Assessment/Plan: Intrahepatic Fleming Abdominal ultrasound 10/08/2018-heterogenous right hepatic mass MRI abdomen 10/14/2018-2 enhancing right liver lesions, 4.8 x 4.5 x 4.1 cm, more superior lesion measured 3 x 2 x 2.5 cm, multiple additional subcentimeter T2 hyperintense lesions-too small to characterize. 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; IDH1 alteration; MSI stable; mismatch repair status proficient; tumor mutational burden low, 3; PD-L1 negative CTs at [facility] 12/12/2018-right hepatic lobe masses, satellite lesions adjacent to the dominant mass, prominent periportal and aortocaval lymph nodes prominent left supraclavicular node Cycle 1 gemcitabine/cisplatin at [place] on 12/12/2018 Cycle 2 gemcitabine/cisplatin 01/02/2019, 01/09/2019 Cycle 3 gemcitabine/cisplatin 01/23/2019 [facility] at [facility] 02/02/2019-decrease in size of multiple hepatic masses, decreased size of prominent periportal and aortocaval nodes, unchanged prominent left supraclavicular node Cycle 4 gemcitabine/cisplatin 02/11/2019, Udenyca added and gemcitabine dose reduced with day 8 chemotherapy Cycle 5 gemcitabine/cisplatin 03/04/2019, gemcitabine reescalated to full dose Cycle 6 gemcitabine/cisplatin 03/25/2019 Cycle 7 gemcitabine/cisplatin 04/15/2019 Cycle 8 gemcitabine/cisplatin 05/06/2019 CT 05/18/2019-decreased size of multiple right hepatic masses 06/17/2019-hepatic arterial infusion pump placed, resection of hepatic artery/portal nodes, 1/7 portal nodes positive 07/10/2019-FUDR No. 1 07/23/2019 gemcitabine 800 mg/m added 08/06/2019-FUDR No. 2, 55 mg (dose reduced due to mild increase in bilirubin (, gemcitabine 800 mg/m and oxaliplatin 68 mg/m 08/20/2019-treatment continued, Decadron and pump continue due to mild increase in alkaline phosphatase 09/17/2019, FUDR No. 3 at 28 mg (dose reduced due to continued increase in alkaline phosphatase) 10/01/2019, CTs with continued response to therapy, decrease in liver disease 10/01/2019, therapy continued 10/15/2019, therapy continue with gemcitabine and oxaliplatin, FUDR restarted at 55 mg 11/12/2019-alkaline phosphatase elevated, FUDR held, Decadron given in hepatic infusion flush 11/26/2019 chemotherapy held secondary to elevated  liver enzymes 11/26/2019-CT with continued improvement in the hepatic tumor burden 12/10/2019-chemotherapy restarted with gemcitabine/oxaliplatin 12/24/2019 chemotherapy continued, FUDR 28 mg 02/04/2020-oxaliplatin held secondary to worsening neuropathy 03/02/2020-CTs-stable disease Treatment continued with  FUDR and gemcitabine CTs 05/26/2020-unchanged treated lesions in the liver, unchanged left upper lobe pulmonary nodules, no evidence of disease progression 05/26/2020-recommendation to continue current therapy with gemcitabine plus FUDR (as labs allow) 06/09/2020-FUDR held 06/23/2020-FUDR held, continue gemcitabine, steroids given in HIA flush 07/21/2020 every 2-week Gemcitabine CTs at [facility] 08/18/2020-stable treated hepatic metastases, no evidence of new or enlarging metastatic disease Every 2-week gemcitabine continued 08/31/2020 gemcitabine, Udenyca MRI 09/16/2020 at [facility]-irregularly shaped 1.9 cm lesion in hepatic segment 5 favored to represent posttreatment fibrosis.  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  Progressively increasing intrahepatic ductal dilatation over the past several exams. ERCP at [facility] 10/03/2020-single severe localized biliary stricture was found in the common hepatic duct.  The stricture was fibrotic.  The left and right hepatic ducts and all intrahepatic branches were moderately dilated.  The upper third of the main bile duct was successfully dilated.  Plastic biliary stent was placed into the common bile duct.  Brushings were obtained in the upper third of the main bile duct-no evidence of malignancy. Family history of multiple cancers eluding breast, pancreatic, melanoma, appendiceal cancer, lung cancer Osteoporosis Neutropenia secondary to chemotherapy, Udenyca added with day 8 cycle 4      Disposition: Angela Fleming appears unchanged.  She underwent successful placement of a bile duct stent 10/03/2020.  Liver enzymes  today show mild improvement compared to 09/30/2020.  She is scheduled for restaging studies and follow-up with Dr. Fanny Skates on 10/20/2020.  She will return for follow-up here on 10/28/2020.  She will contact the office in the interim with any problems.  Patient seen with Dr. Benay Spice.    Ned Card ANP/GNP-BC   10/07/2020  1:42 PM  This was a shared visit with Ned Card.  We discussed the ERCP findings and treatment options with Angela Fleming.  She is scheduled for restaging at [facility] on 10/20/2020.  We will hold gemcitabine until after the restaging evaluation.  We will consider observation, resuming gemcitabine, and switching to a different systemic therapy regimen based on the restaging imaging.  I was present for greater than 50% of today's visit.  I performed medical decision making.  Julieanne Manson, MD

## 2020-10-13 ENCOUNTER — Inpatient Hospital Stay: Payer: No Typology Code available for payment source

## 2020-10-24 ENCOUNTER — Encounter: Payer: Self-pay | Admitting: *Deleted

## 2020-10-27 ENCOUNTER — Inpatient Hospital Stay: Payer: No Typology Code available for payment source

## 2020-10-27 ENCOUNTER — Other Ambulatory Visit: Payer: Self-pay

## 2020-10-27 ENCOUNTER — Inpatient Hospital Stay: Payer: No Typology Code available for payment source | Attending: Oncology | Admitting: Oncology

## 2020-10-27 ENCOUNTER — Ambulatory Visit: Payer: 59

## 2020-10-27 VITALS — BP 95/76 | HR 76 | Temp 97.8°F | Resp 18 | Ht 64.0 in | Wt 127.6 lb

## 2020-10-27 DIAGNOSIS — E041 Nontoxic single thyroid nodule: Secondary | ICD-10-CM | POA: Insufficient documentation

## 2020-10-27 DIAGNOSIS — Z452 Encounter for adjustment and management of vascular access device: Secondary | ICD-10-CM | POA: Diagnosis not present

## 2020-10-27 DIAGNOSIS — Z95828 Presence of other vascular implants and grafts: Secondary | ICD-10-CM

## 2020-10-27 DIAGNOSIS — Z808 Family history of malignant neoplasm of other organs or systems: Secondary | ICD-10-CM | POA: Diagnosis not present

## 2020-10-27 DIAGNOSIS — Z800 Family history of malignant neoplasm of digestive organs: Secondary | ICD-10-CM | POA: Diagnosis not present

## 2020-10-27 DIAGNOSIS — D701 Agranulocytosis secondary to cancer chemotherapy: Secondary | ICD-10-CM | POA: Diagnosis not present

## 2020-10-27 DIAGNOSIS — C221 Intrahepatic bile duct carcinoma: Secondary | ICD-10-CM | POA: Insufficient documentation

## 2020-10-27 DIAGNOSIS — T451X5D Adverse effect of antineoplastic and immunosuppressive drugs, subsequent encounter: Secondary | ICD-10-CM | POA: Diagnosis not present

## 2020-10-27 DIAGNOSIS — Z801 Family history of malignant neoplasm of trachea, bronchus and lung: Secondary | ICD-10-CM | POA: Diagnosis not present

## 2020-10-27 MED ORDER — SODIUM CHLORIDE (PF) 0.9 % IJ SOLN
Freq: Once | INTRAMUSCULAR | Status: AC
  Filled 2020-10-27: qty 5

## 2020-10-27 NOTE — Patient Instructions (Addendum)
Heparin injection What is this medication? HEPARIN (HEP a rin) is an anticoagulant. It is used to treat or prevent clots in the veins, arteries, lungs, or heart. It stops clots from forming or getting bigger. This medicine prevents clotting during open-heart surgery, dialysis, or in patients who are confined to bed. This medicine may be used for other purposes; ask your health care provider or pharmacist if you have questions. COMMON BRAND NAME(S): Hep-Lock, Hep-Lock U/P, Hepflush-10, Monoject Prefill Advanced Heparin Lock Flush, SASH Normal Saline and Heparin What should I tell my care team before I take this medication? They need to know if you have any of these conditions: bleeding disorders, such as hemophilia or low blood platelets bowel disease or diverticulitis endocarditis high blood pressure liver disease recent surgery or delivery of a baby stomach ulcers an unusual or allergic reaction to heparin, benzyl alcohol, sulfites, other medicines, foods, dyes, or preservatives pregnant or trying to get pregnant breast-feeding How should I use this medication? This medicine is given by injection or infusion into a vein. It can also be given by injection of small amounts under the skin. It is usually given by a health care professional in a hospital or clinic setting. If you get this medicine at home, you will be taught how to prepare and give this medicine. Use exactly as directed. Take your medicine at regular intervals. Do not take it more often than directed. Do not stop taking except on your doctor's advice. Stopping this medicine may increase your risk of a blot clot. Be sure to refill your prescription before you run out of medicine. It is important that you put your used needles and syringes in a special sharps container. Do not put them in a trash can. If you do not have a sharps container, call your pharmacist or healthcare provider to get one. Talk to your pediatrician regarding the  use of this medicine in children. While this medicine may be prescribed for children for selected conditions, precautions do apply. Overdosage: If you think you have taken too much of this medicine contact a poison control center or emergency room at once. NOTE: This medicine is only for you. Do not share this medicine with others. What if I miss a dose? If you miss a dose, take it as soon as you can. If it is almost time for your next dose, take only that dose. Do not take double or extra doses. What may interact with this medication? Do not take this medicine with any of the following medications: aspirin and aspirin-like drugs mifepristone medicines that treat or prevent blood clots like warfarin, enoxaparin, and dalteparin palifermin protamine This medicine may also interact with the following medications: dextran digoxin hydroxychloroquine medicines for treating colds or allergies nicotine NSAIDs, medicines for pain and inflammation, like ibuprofen or naproxen phenylbutazone tetracycline antibiotics This list may not describe all possible interactions. Give your health care provider a list of all the medicines, herbs, non-prescription drugs, or dietary supplements you use. Also tell them if you smoke, drink alcohol, or use illegal drugs. Some items may interact with your medicine. What should I watch for while using this medication? Visit your healthcare professional for regular checks on your progress. You may need blood work done while you are taking this medicine. Your condition will be monitored carefully while you are receiving this medicine. It is important not to miss any appointments. Wear a medical ID bracelet or chain, and carry a card that describes your disease and details   of your medicine and dosage times. Notify your doctor or healthcare professional at once if you have cold, blue hands or feet. If you are going to need surgery or other procedure, tell your healthcare  professional that you are using this medicine. Avoid sports and activities that might cause injury while you are using this medicine. Severe falls or injuries can cause unseen bleeding. Be careful when using sharp tools or knives. Consider using an Copy. Take special care brushing or flossing your teeth. Report any injuries, bruising, or red spots on the skin to your healthcare professional. Using this medicine for a long time may weaken your bones and increase the risk of bone fractures. You should make sure that you get enough calcium and vitamin D while you are taking this medicine. Discuss the foods you eat and the vitamins you take with your healthcare professional. Wear a medical ID bracelet or chain. Carry a card that describes your disease and details of your medicine and dosage times. What side effects may I notice from receiving this medication? Side effects that you should report to your doctor or health care professional as soon as possible: allergic reactions like skin rash, itching or hives, swelling of the face, lips, or tongue bone pain fever, chills nausea, vomiting signs and symptoms of bleeding such as bloody or black, tarry stools; red or dark-brown urine; spitting up blood or brown material that looks like coffee grounds; red spots on the skin; unusual bruising or bleeding from the eye, gums, or nose signs and symptoms of a blood clot such as chest pain; shortness of breath; pain, swelling, or warmth in the leg signs and symptoms of a stroke such as changes in vision; confusion; trouble speaking or understanding; severe headaches; sudden numbness or weakness of the face, arm or leg; trouble walking; dizziness; loss of coordination Side effects that usually do not require medical attention (report to your doctor or health care professional if they continue or are bothersome): hair loss pain, redness, or irritation at site where injected This list may not describe all  possible side effects. Call your doctor for medical advice about side effects. You may report side effects to FDA at 1-800-FDA-1088. Where should I keep my medication? Keep out of the reach of children. Store unopened vials at room temperature between 15 and 30 degrees C (59 and 86 degrees F). Do not freeze. Do not use if solution is discolored or particulate matter is present. Throw away any unused medicine after the expiration date. NOTE: This sheet is a summary. It may not cover all possible information. If you have questions about this medicine, talk to your doctor, pharmacist, or health care provider.  2022 Elsevier/Gold Standard (2020-03-09 11:02:57)

## 2020-10-27 NOTE — Progress Notes (Signed)
[place] OFFICE PROGRESS NOTE   Diagnosis: Cholangiocarcinoma  INTERVAL HISTORY:   Angela Fleming returns as scheduled.  She underwent restaging evaluation at [facility] on 10/20/2020.  There was no evidence of residual active malignancy.  Dr. Fanny Skates recommends a treatment break.  She feels well.  Nausea and pruritus have resolved.  No fever.  No complaint.  Objective:  Vital signs in last 24 hours:  Blood pressure 95/76, pulse 76, temperature 97.8 F (36.6 C), temperature source Oral, resp. rate 18, height _0  (1.626 m), weight 127 lb 9.6 oz (57.9 kg), SpO2 100 %.    HEENT: Neck without mass, no palpable thyroid nodule Resp: Lungs clear bilaterally Cardio: Regular rate and rhythm GI: No hepatosplenomegaly, left abdomen infusion pump Vascular: No leg edema   Portacath/PICC-without erythema  Lab Results:  Lab Results  Component Value Date   WBC 5.0 10/07/2020   HGB 12.2 10/07/2020   HCT 37.9 10/07/2020   MCV 90.2 10/07/2020   PLT 185 10/07/2020   NEUTROABS 3.0 10/07/2020    CMP  Lab Results  Component Value Date   NA 136 10/07/2020   K 3.8 10/07/2020   CL 101 10/07/2020   CO2 26 10/07/2020   GLUCOSE 122 (H) 10/07/2020   BUN 15 10/07/2020   CREATININE 0.52 10/07/2020   CALCIUM 9.2 10/07/2020   PROT 6.3 (L) 10/07/2020   ALBUMIN 3.8 10/07/2020   AST 47 (H) 10/07/2020   ALT 77 (H) 10/07/2020   ALKPHOS 221 (H) 10/07/2020   BILITOT 2.0 (H) 10/07/2020   GFRNONAA >60 10/07/2020   GFRAA >60 05/13/2019    Lab Results  Component Value Date   CEA1 1.02 01/02/2019   DDU202 5 01/02/2019    No results found for: INR, LABPROT  Imaging:  No results found.  Medications: I have reviewed the patient's current medications.   Assessment/Plan:  Intrahepatic cholangiocarcinoma Abdominal ultrasound 10/08/2018-heterogenous right hepatic mass MRI abdomen 10/14/2018-2 enhancing right liver lesions, 4.8 x 4.5 x 4.1 cm, more superior lesion measured 3 x 2 x 2.5 cm,  multiple additional subcentimeter T2 hyperintense lesions-too small to characterize. CT chest 11/04/2018-6 mm left upper lobe groundglass nodule, no evidence of metastatic disease in the chest Biopsy of right liver lesion at [facility] 11/10/2018-moderately differentiated adenocarcinoma consistent with cholangiocarcinoma; IDH1 alteration; MSI stable; mismatch repair status proficient; tumor mutational burden low, 3; PD-L1 negative CTs at [facility] 12/12/2018-right hepatic lobe masses, satellite lesions adjacent to the dominant mass, prominent periportal and aortocaval lymph nodes prominent left supraclavicular node Cycle 1 gemcitabine/cisplatin at [place] on 12/12/2018 Cycle 2 gemcitabine/cisplatin 01/02/2019, 01/09/2019 Cycle 3 gemcitabine/cisplatin 01/23/2019 CTs at [facility] 02/02/2019-decrease in size of multiple hepatic masses, decreased size of prominent periportal and aortocaval nodes, unchanged prominent left supraclavicular node Cycle 4 gemcitabine/cisplatin 02/11/2019, Udenyca added and gemcitabine dose reduced with day 8 chemotherapy Cycle 5 gemcitabine/cisplatin 03/04/2019, gemcitabine reescalated to full dose Cycle 6 gemcitabine/cisplatin 03/25/2019 Cycle 7 gemcitabine/cisplatin 04/15/2019 Cycle 8 gemcitabine/cisplatin 05/06/2019 CT 05/18/2019-decreased size of multiple right hepatic masses 06/17/2019-hepatic arterial infusion pump placed, resection of hepatic artery/portal nodes, 1/7 portal nodes positive 07/10/2019-FUDR No. 1 07/23/2019 gemcitabine 800 mg/m added 08/06/2019-FUDR No. 2, 55 mg (dose reduced due to mild increase in bilirubin (, gemcitabine 800 mg/m and oxaliplatin 68 mg/m 08/20/2019-treatment continued, Decadron and pump continue due to mild increase in alkaline phosphatase 09/17/2019, FUDR No. 3 at 28 mg (dose reduced due to continued increase in alkaline phosphatase) 10/01/2019, CTs with continued response to therapy, decrease in liver disease 10/01/2019, therapy  continued 10/15/2019, therapy  continue with gemcitabine and oxaliplatin, FUDR restarted at 55 mg 11/12/2019-alkaline phosphatase elevated, FUDR held, Decadron given in hepatic infusion flush 11/26/2019 chemotherapy held secondary to elevated liver enzymes 11/26/2019-CT with continued improvement in the hepatic tumor burden 12/10/2019-chemotherapy restarted with gemcitabine/oxaliplatin 12/24/2019 chemotherapy continued, FUDR 28 mg 02/04/2020-oxaliplatin held secondary to worsening neuropathy 03/02/2020-CTs-stable disease Treatment continued with FUDR and gemcitabine CTs 05/26/2020-unchanged treated lesions in the liver, unchanged left upper lobe pulmonary nodules, no evidence of disease progression 05/26/2020-recommendation to continue current therapy with gemcitabine plus FUDR (as labs allow) 06/09/2020-FUDR held 06/23/2020-FUDR held, continue gemcitabine, steroids given in HIA flush 07/21/2020 every 2-week Gemcitabine CTs at [facility] 08/18/2020-stable treated hepatic metastases, no evidence of new or enlarging metastatic disease Every 2-week gemcitabine continued 08/31/2020 gemcitabine, Udenyca MRI 09/16/2020 at [facility]-irregularly shaped 1.9 cm lesion in hepatic segment 5 favored to represent posttreatment fibrosis.  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  Progressively increasing intrahepatic ductal dilatation over the past several exams. ERCP at [facility] 10/03/2020-single severe localized biliary stricture was found in the common hepatic duct.  The stricture was fibrotic.  The left and right hepatic ducts and all intrahepatic branches were moderately dilated.  The upper third of the main bile duct was successfully dilated.  Plastic biliary stent was placed into the common bile duct.  Brushings were obtained in the upper third of the main bile duct-no evidence of malignancy. PET CT at [facility] 10/20/2020-decreased intrahepatic duct dilation, previously described segment 5 lesion without  increased FDG activity, hypermetabolic left thyroid lesion Family history of multiple cancers eluding breast, pancreatic, melanoma, appendiceal cancer, lung cancer Osteoporosis Neutropenia secondary to chemotherapy, Udenyca added with day 8 cycle 4 Hypermetabolic left thyroid nodule on PET 10/20/2020      Disposition: Angela Fleming is in clinical remission from cholangiocarcinoma.  The restaging evaluation at [facility] on 10/20/2020 revealed no active disease.  The plan is to follow her with observation.  She is scheduled for restaging at [facility] in December.  We will flush the Port-A-Cath and hepatic infusion pump every 4 weeks.  We will check a chemistry panel she in 4 weeks.  She will return for an office visit in 8 weeks.  Angela Fleming will be referred for thyroid ultrasound.  [name], MD  10/27/2020  9:59 AM

## 2020-10-27 NOTE — Progress Notes (Signed)
Medtronic Pump Refill  Accessed Medtronic pump left upper abdomen using sterile technique without difficulty. Aspirated 3.1 ml (3.9 ml pump balance) of clear fluid (confirmed w/ipad). Instilled slowly aspirating every  3 ml for a total of 25,000 Units Heparin in 20 ml NS without difficulty. Patient tolerated procedure well. Confirmed next appointment is 11/24/20. Discharged in good condition.

## 2020-10-31 ENCOUNTER — Encounter: Payer: Self-pay | Admitting: Oncology

## 2020-10-31 ENCOUNTER — Other Ambulatory Visit (HOSPITAL_BASED_OUTPATIENT_CLINIC_OR_DEPARTMENT_OTHER): Payer: Self-pay | Admitting: Oncology

## 2020-10-31 ENCOUNTER — Ambulatory Visit (HOSPITAL_BASED_OUTPATIENT_CLINIC_OR_DEPARTMENT_OTHER)
Admission: RE | Admit: 2020-10-31 | Discharge: 2020-10-31 | Disposition: A | Discharge status: Home / Self Care | Payer: No Typology Code available for payment source | Source: Ambulatory Visit | Attending: Oncology | Admitting: Oncology

## 2020-10-31 ENCOUNTER — Other Ambulatory Visit: Payer: Self-pay

## 2020-10-31 DIAGNOSIS — Z1231 Encounter for screening mammogram for malignant neoplasm of breast: Secondary | ICD-10-CM

## 2020-10-31 DIAGNOSIS — C221 Intrahepatic bile duct carcinoma: Secondary | ICD-10-CM | POA: Diagnosis present

## 2020-11-01 ENCOUNTER — Encounter: Payer: Self-pay | Admitting: *Deleted

## 2020-11-01 NOTE — Progress Notes (Signed)
Per Dr. Benay Spice: Thyroid mass meets size criteria to biopsy. Patient is requesting Dr. Fanny Skates arrange for the biopsy. Routed thyroid US report to Dr. Fanny Skates with request to arrange for biopsy.

## 2020-11-10 ENCOUNTER — Telehealth: Payer: Self-pay | Admitting: *Deleted

## 2020-11-10 NOTE — Telephone Encounter (Signed)
Call from new patient coordinator at [facility] requesting thyroid US images from 10/31/20.  Email to [facility] w/radiology to push over images to [facility].

## 2020-11-11 ENCOUNTER — Encounter: Payer: Self-pay | Admitting: Oncology

## 2020-11-24 ENCOUNTER — Other Ambulatory Visit: Payer: Self-pay

## 2020-11-24 ENCOUNTER — Inpatient Hospital Stay: Payer: No Typology Code available for payment source | Attending: Oncology

## 2020-11-24 ENCOUNTER — Inpatient Hospital Stay: Payer: No Typology Code available for payment source

## 2020-11-24 VITALS — BP 111/65 | HR 79 | Temp 98.9°F | Resp 18

## 2020-11-24 DIAGNOSIS — C221 Intrahepatic bile duct carcinoma: Secondary | ICD-10-CM

## 2020-11-24 DIAGNOSIS — Z95828 Presence of other vascular implants and grafts: Secondary | ICD-10-CM

## 2020-11-24 LAB — CMP (CANCER CENTER ONLY)
ALT: 18 U/L (ref 0–44)
AST: 22 U/L (ref 15–41)
Albumin: 4.4 g/dL (ref 3.5–5.0)
Alkaline Phosphatase: 170 U/L — ABNORMAL HIGH (ref 38–126)
Anion gap: 9 (ref 5–15)
BUN: 18 mg/dL (ref 8–23)
CO2: 24 mmol/L (ref 22–32)
Calcium: 9.4 mg/dL (ref 8.9–10.3)
Chloride: 106 mmol/L (ref 98–111)
Creatinine: 0.58 mg/dL (ref 0.44–1.00)
GFR, Estimated: >60 mL/min (ref >60)
Glucose, Bld: 91 mg/dL (ref 70–99)
Potassium: 4.3 mmol/L (ref 3.5–5.1)
Sodium: 139 mmol/L (ref 135–145)
Total Bilirubin: 0.5 mg/dL (ref 0.3–1.2)
Total Protein: 6.7 g/dL (ref 6.5–8.1)

## 2020-11-24 MED ORDER — SODIUM CHLORIDE 0.9% FLUSH
10.0000 mL | Freq: Once | INTRAVENOUS | Status: AC
  Administered 2020-11-24: 10 mL

## 2020-11-24 MED ORDER — SODIUM CHLORIDE (PF) 0.9 % IJ SOLN
Freq: Once | INTRAMUSCULAR | Status: AC
  Filled 2020-11-24: qty 5

## 2020-11-24 MED ORDER — SODIUM CHLORIDE 0.9% FLUSH: 10.0000 mL | INTRAVENOUS | Status: DC | PRN

## 2020-11-24 MED ORDER — HEPARIN SOD (PORK) LOCK FLUSH 100 UNIT/ML IV SOLN
500.0000 [IU] | Freq: Once | INTRAVENOUS | Status: AC | PRN
  Administered 2020-11-24: 500 [IU]

## 2020-11-24 NOTE — Progress Notes (Signed)
Medtronic Pump Refill   Accessed Medtronic pump left upper abdomen using sterile technique without difficulty. Aspirated 3.2 ml (3.2 ml pump balance) of clear fluid (confirmed w/ipad). Instilled slowly aspirating every  3 ml for a total of 25,000 Units Heparin in 20 ml NS without difficulty. Patient tolerated procedure well. Confirmed next appointment is 12/23/20. Discharged in good condition.

## 2020-12-22 ENCOUNTER — Inpatient Hospital Stay (HOSPITAL_BASED_OUTPATIENT_CLINIC_OR_DEPARTMENT_OTHER): Payer: No Typology Code available for payment source | Admitting: Nurse Practitioner

## 2020-12-22 ENCOUNTER — Inpatient Hospital Stay: Payer: No Typology Code available for payment source | Attending: Oncology

## 2020-12-22 ENCOUNTER — Inpatient Hospital Stay: Payer: No Typology Code available for payment source

## 2020-12-22 ENCOUNTER — Ambulatory Visit: Payer: 59 | Admitting: Nurse Practitioner

## 2020-12-22 ENCOUNTER — Other Ambulatory Visit: Payer: Self-pay

## 2020-12-22 ENCOUNTER — Encounter: Payer: Self-pay | Admitting: Nurse Practitioner

## 2020-12-22 ENCOUNTER — Other Ambulatory Visit: Payer: 59

## 2020-12-22 ENCOUNTER — Ambulatory Visit: Payer: 59

## 2020-12-22 VITALS — BP 131/68 | HR 71 | Temp 98.7°F | Resp 18 | Ht 64.0 in | Wt 125.4 lb

## 2020-12-22 DIAGNOSIS — T451X5D Adverse effect of antineoplastic and immunosuppressive drugs, subsequent encounter: Secondary | ICD-10-CM | POA: Insufficient documentation

## 2020-12-22 DIAGNOSIS — C221 Intrahepatic bile duct carcinoma: Secondary | ICD-10-CM

## 2020-12-22 DIAGNOSIS — D701 Agranulocytosis secondary to cancer chemotherapy: Secondary | ICD-10-CM | POA: Diagnosis not present

## 2020-12-22 DIAGNOSIS — Z800 Family history of malignant neoplasm of digestive organs: Secondary | ICD-10-CM | POA: Insufficient documentation

## 2020-12-22 DIAGNOSIS — Z808 Family history of malignant neoplasm of other organs or systems: Secondary | ICD-10-CM | POA: Insufficient documentation

## 2020-12-22 DIAGNOSIS — C73 Malignant neoplasm of thyroid gland: Secondary | ICD-10-CM | POA: Diagnosis not present

## 2020-12-22 DIAGNOSIS — Z803 Family history of malignant neoplasm of breast: Secondary | ICD-10-CM | POA: Insufficient documentation

## 2020-12-22 DIAGNOSIS — Z23 Encounter for immunization: Secondary | ICD-10-CM | POA: Insufficient documentation

## 2020-12-22 DIAGNOSIS — M810 Age-related osteoporosis without current pathological fracture: Secondary | ICD-10-CM | POA: Diagnosis not present

## 2020-12-22 DIAGNOSIS — Z801 Family history of malignant neoplasm of trachea, bronchus and lung: Secondary | ICD-10-CM | POA: Insufficient documentation

## 2020-12-22 DIAGNOSIS — Z95828 Presence of other vascular implants and grafts: Secondary | ICD-10-CM

## 2020-12-22 LAB — CMP (CANCER CENTER ONLY)
ALT: 13 U/L (ref 0–44)
AST: 16 U/L (ref 15–41)
Albumin: 4.1 g/dL (ref 3.5–5.0)
Alkaline Phosphatase: 133 U/L — ABNORMAL HIGH (ref 38–126)
Anion gap: 9 (ref 5–15)
BUN: 15 mg/dL (ref 8–23)
CO2: 24 mmol/L (ref 22–32)
Calcium: 9.2 mg/dL (ref 8.9–10.3)
Chloride: 104 mmol/L (ref 98–111)
Creatinine: 0.54 mg/dL (ref 0.44–1.00)
GFR, Estimated: >60 mL/min (ref >60)
Glucose, Bld: 98 mg/dL (ref 70–99)
Potassium: 3.8 mmol/L (ref 3.5–5.1)
Sodium: 137 mmol/L (ref 135–145)
Total Bilirubin: 0.6 mg/dL (ref 0.3–1.2)
Total Protein: 6.7 g/dL (ref 6.5–8.1)

## 2020-12-22 MED ORDER — INFLUENZA VAC SPLIT QUAD 0.5 ML IM SUSY
0.5000 mL | PREFILLED_SYRINGE | Freq: Once | INTRAMUSCULAR | Status: AC
  Administered 2020-12-22: 0.5 mL via INTRAMUSCULAR
  Filled 2020-12-22: qty 0.5

## 2020-12-22 MED ORDER — SODIUM CHLORIDE (PF) 0.9 % IJ SOLN
Freq: Once | INTRAMUSCULAR | Status: AC
  Filled 2020-12-22: qty 5

## 2020-12-22 NOTE — Progress Notes (Signed)
[name]   Diagnosis: Cholangiocarcinoma  INTERVAL HISTORY:   Angela Fleming returns as scheduled.  She in general has been feeling well.  At present she has cold symptoms.  No fever.  No shortness of breath.  Mainly congestion.  Pruritus resolved.  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  She is awaiting a call from endocrinology.  She thinks surgery may be recommended.  Appetite is better.  No abdominal pain.  Objective:  Vital signs in last 24 hours:  Blood pressure 131/68, pulse 71, temperature 98.7 F (37.1 C), temperature source Oral, resp. rate 18, height _0  (1.626 m), weight 125 lb 6.4 oz (56.9 kg), SpO2 98 %.    Lymphatics: No palpable cervical or supraclavicular lymph nodes. Resp: Lungs clear bilaterally. Cardio: Regular rate and rhythm. GI: No hepatomegaly.  Left abdomen hepatic infusion pump. Vascular: No leg edema.  Port-A-Cath without erythema.  Lab Results:  Lab Results  Component Value Date   WBC 5.0 10/07/2020   HGB 12.2 10/07/2020   HCT 37.9 10/07/2020   MCV 90.2 10/07/2020   PLT 185 10/07/2020   NEUTROABS 3.0 10/07/2020    Imaging:  No results found.  Medications: I have reviewed the patient's current medications.  Assessment/Plan: Intrahepatic cholangiocarcinoma Abdominal ultrasound 10/08/2018-heterogenous right hepatic mass MRI abdomen 10/14/2018-2 enhancing right liver lesions, 4.8 x 4.5 x 4.1 cm, more superior lesion measured 3 x 2 x 2.5 cm, multiple additional subcentimeter T2 hyperintense lesions-too small to characterize. CT chest 11/04/2018-6 mm left upper lobe groundglass nodule, no evidence of metastatic disease in the chest Biopsy of right liver lesion at [facility] 11/10/2018-moderately differentiated adenocarcinoma consistent with cholangiocarcinoma; IDH1 alteration; MSI stable; mismatch repair status proficient; tumor mutational burden low, 3; PD-L1  negative CTs at [facility] 12/12/2018-right hepatic lobe masses, satellite lesions adjacent to the dominant mass, prominent periportal and aortocaval lymph nodes prominent left supraclavicular node Cycle 1 gemcitabine/cisplatin at [place] on 12/12/2018 Cycle 2 gemcitabine/cisplatin 01/02/2019, 01/09/2019 Cycle 3 gemcitabine/cisplatin 01/23/2019 CTs at [facility] 02/02/2019-decrease in size of multiple hepatic masses, decreased size of prominent periportal and aortocaval nodes, unchanged prominent left supraclavicular node Cycle 4 gemcitabine/cisplatin 02/11/2019, Udenyca added and gemcitabine dose reduced with day 8 chemotherapy Cycle 5 gemcitabine/cisplatin 03/04/2019, gemcitabine reescalated to full dose Cycle 6 gemcitabine/cisplatin 03/25/2019 Cycle 7 gemcitabine/cisplatin 04/15/2019 Cycle 8 gemcitabine/cisplatin 05/06/2019 CT 05/18/2019-decreased size of multiple right hepatic masses 06/17/2019-hepatic arterial infusion pump placed, resection of hepatic artery/portal nodes, 1/7 portal nodes positive 07/10/2019-FUDR No. 1 07/23/2019 gemcitabine 800 mg/m added 08/06/2019-FUDR No. 2, 55 mg (dose reduced due to mild increase in bilirubin (, gemcitabine 800 mg/m and oxaliplatin 68 mg/m 08/20/2019-treatment continued, Decadron and pump continue due to mild increase in alkaline phosphatase 09/17/2019, FUDR No. 3 at 28 mg (dose reduced due to continued increase in alkaline phosphatase) 10/01/2019, CTs with continued response to therapy, decrease in liver disease 10/01/2019, therapy continued 10/15/2019, therapy continue with gemcitabine and oxaliplatin, FUDR restarted at 55 mg 11/12/2019-alkaline phosphatase elevated, FUDR held, Decadron given in hepatic infusion flush 11/26/2019 chemotherapy held secondary to elevated liver enzymes 11/26/2019-CT with continued improvement in the hepatic tumor burden 12/10/2019-chemotherapy restarted with gemcitabine/oxaliplatin 12/24/2019 chemotherapy continued, FUDR 28  mg 02/04/2020-oxaliplatin held secondary to worsening neuropathy 03/02/2020-CTs-stable disease Treatment continued with FUDR and gemcitabine CTs 05/26/2020-unchanged treated lesions in the liver, unchanged left upper lobe pulmonary nodules, no evidence of disease progression 05/26/2020-recommendation to continue current therapy with gemcitabine plus FUDR (as labs allow)  06/09/2020-FUDR held 06/23/2020-FUDR held, continue gemcitabine, steroids given in HIA flush 07/21/2020 every 2-week Gemcitabine CTs at [facility] 08/18/2020-stable treated hepatic metastases, no evidence of new or enlarging metastatic disease Every 2-week gemcitabine continued 08/31/2020 gemcitabine, Udenyca MRI 09/16/2020 at [facility]-irregularly shaped 1.9 cm lesion in hepatic segment 5 favored to represent posttreatment fibrosis.  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  Progressively increasing intrahepatic ductal dilatation over the past several exams. ERCP at [facility] 10/03/2020-single severe localized biliary stricture was found in the common hepatic duct.  The stricture was fibrotic.  The left and right hepatic ducts and all intrahepatic branches were moderately dilated.  The upper third of the main bile duct was successfully dilated.  Plastic biliary stent was placed into the common bile duct.  Brushings were obtained in the upper third of the main bile duct-no evidence of malignancy. PET CT at [facility] 10/20/2020-decreased intrahepatic duct dilation, previously described segment 5 lesion without increased FDG activity, hypermetabolic left thyroid lesion Family history of multiple cancers eluding breast, pancreatic, melanoma, appendiceal cancer, lung cancer Osteoporosis Neutropenia secondary to chemotherapy, Udenyca added with day 8 cycle 4 Hypermetabolic left thyroid nodule on PET 10/20/2020; thyroid nodule and left neck lymph node biopsy 12/12/2020-both diagnostic of malignancy, papillary thyroid  carcinoma    Disposition: Angela Fleming appears stable.  There is no clinical evidence for progression of the cholangiocarcinoma.  She is scheduled for restaging at [facility] in December.  We will continue to flush the Port-A-Cath and hepatic infusion pump every 4 weeks.  She reports a new diagnosis of papillary thyroid cancer.  She has established care with endocrinology at [facility], currently awaiting an update on the plan going forward.  If surgery is recommended she would like to go ahead and proceed with this.  She will return for lab, follow-up, Port-A-Cath flush, hepatic infusion pump flush in 4 weeks.  Patient seen with Dr. Benay Spice.    Ned Card ANP/GNP-BC   12/22/2020  11:16 AM This was a shared visit with Ned Card.  Angela Fleming appears stable.  She has been diagnosed with papillary thyroid carcinoma metastatic to the neck lymph node.  She is anxious to proceed with surgical resection.  She will undergo restaging CTs prior to surgery.  She remains off of specific therapy for the cholangiocarcinoma.  I was present for greater than 50% of today's visit.  I performed medical decision making.

## 2020-12-22 NOTE — Progress Notes (Signed)
Accessed Medtronic pump left upper abdomen using sterile technique without difficulty. Aspirated 3.2 ml (3.2 ml pump balance) of clear fluid (confirmed w/ipad). Instilled slowly aspirating every  3 ml for a total of 25,000 Units Heparin in 20 ml NS without difficulty. Patient tolerated procedure well. Confirmed next appointment is 01/19/2021. Discharged in good condition.

## 2020-12-22 NOTE — Patient Instructions (Signed)
Influenza Virus Vaccine injection °What is this medication? °INFLUENZA VIRUS VACCINE (in floo EN zuh VAHY ruhs vak SEEN) helps to reduce the risk of getting influenza also known as the flu. The vaccine only helps protect you against some strains of the flu. °This medicine may be used for other purposes; ask your health care provider or pharmacist if you have questions. °COMMON BRAND NAME(S): Afluria, Afluria Quadrivalent, Agriflu, Alfuria, FLUAD, FLUAD Quadrivalent, Fluarix, Fluarix Quadrivalent, Flublok, Flublok Quadrivalent, FLUCELVAX, FLUCELVAX Quadrivalent, Flulaval, Flulaval Quadrivalent, Fluvirin, Fluzone, Fluzone High-Dose, Fluzone Intradermal, Fluzone Quadrivalent °What should I tell my care team before I take this medication? °They need to know if you have any of these conditions: °bleeding disorder like hemophilia °fever or infection °Guillain-Barre syndrome or other neurological problems °immune system problems °infection with the human immunodeficiency virus (HIV) or AIDS °low blood platelet counts °multiple sclerosis °an unusual or allergic reaction to influenza virus vaccine, latex, other medicines, foods, dyes, or preservatives. Different brands of vaccines contain different allergens. Some may contain latex or eggs. Talk to your doctor about your allergies to make sure that you get the right vaccine. °pregnant or trying to get pregnant °breast-feeding °How should I use this medication? °This vaccine is for injection into a muscle or under the skin. It is given by a health care professional. °A copy of Vaccine Information Statements will be given before each vaccination. Read this sheet carefully each time. The sheet may change frequently. °Talk to your healthcare provider to see which vaccines are right for you. Some vaccines should not be used in all age groups. °Overdosage: If you think you have taken too much of this medicine contact a poison control center or emergency room at once. °NOTE: This  medicine is only for you. Do not share this medicine with others. °What if I miss a dose? °This does not apply. °What may interact with this medication? °chemotherapy or radiation therapy °medicines that lower your immune system like etanercept, anakinra, infliximab, and adalimumab °medicines that treat or prevent blood clots like warfarin °phenytoin °steroid medicines like prednisone or cortisone °theophylline °vaccines °This list may not describe all possible interactions. Give your health care provider a list of all the medicines, herbs, non-prescription drugs, or dietary supplements you use. Also tell them if you smoke, drink alcohol, or use illegal drugs. Some items may interact with your medicine. °What should I watch for while using this medication? °Report any side effects that do not go away within 3 days to your doctor or health care professional. Call your health care provider if any unusual symptoms occur within 6 weeks of receiving this vaccine. °You may still catch the flu, but the illness is not usually as bad. You cannot get the flu from the vaccine. The vaccine will not protect against colds or other illnesses that may cause fever. The vaccine is needed every year. °What side effects may I notice from receiving this medication? °Side effects that you should report to your doctor or health care professional as soon as possible: °allergic reactions like skin rash, itching or hives, swelling of the face, lips, or tongue °Side effects that usually do not require medical attention (report to your doctor or health care professional if they continue or are bothersome): °fever °headache °muscle aches and pains °pain, tenderness, redness, or swelling at the injection site °tiredness °This list may not describe all possible side effects. Call your doctor for medical advice about side effects. You may report side effects to FDA at 1-800-FDA-1088. °  Where should I keep my medication? °The vaccine will be given  by a health care professional in a clinic, pharmacy, doctor's office, or other health care setting. You will not be given vaccine doses to store at home. °NOTE: This sheet is a summary. It may not cover all possible information. If you have questions about this medicine, talk to your doctor, pharmacist, or health care provider. °© 2022 Elsevier/Gold Standard (2020-10-18 00:00:00) ° °

## 2020-12-23 ENCOUNTER — Encounter: Payer: Self-pay | Admitting: Oncology

## 2020-12-23 ENCOUNTER — Encounter: Payer: Self-pay | Admitting: *Deleted

## 2020-12-27 ENCOUNTER — Other Ambulatory Visit: Payer: Self-pay | Admitting: *Deleted

## 2020-12-27 DIAGNOSIS — C221 Intrahepatic bile duct carcinoma: Secondary | ICD-10-CM

## 2020-12-27 DIAGNOSIS — C73 Malignant neoplasm of thyroid gland: Secondary | ICD-10-CM

## 2020-12-27 NOTE — Progress Notes (Addendum)
[facility] not able to accommodate surgery for thyroid cancer until after January and she strongly desires it to be done in January due to family obligations in Feb/March. Referral sent to [facility] for Dr. Harlow Asa to see per Dr. Benay Spice order. Referral order, demographics and chart information faxed to [facility]. Patient contacted RN and requested the referral to be cancelled. Dr. Fanny Skates was insistent that she have her surgery at [facility]. Called [facility] and cancelled the referral.

## 2021-01-16 ENCOUNTER — Encounter: Payer: Self-pay | Admitting: Oncology

## 2021-01-19 ENCOUNTER — Other Ambulatory Visit: Payer: Self-pay

## 2021-01-19 ENCOUNTER — Inpatient Hospital Stay (HOSPITAL_BASED_OUTPATIENT_CLINIC_OR_DEPARTMENT_OTHER): Payer: No Typology Code available for payment source | Admitting: Oncology

## 2021-01-19 ENCOUNTER — Inpatient Hospital Stay: Payer: No Typology Code available for payment source

## 2021-01-19 ENCOUNTER — Inpatient Hospital Stay: Payer: No Typology Code available for payment source | Attending: Oncology

## 2021-01-19 ENCOUNTER — Encounter: Payer: Self-pay | Admitting: *Deleted

## 2021-01-19 VITALS — BP 128/73 | HR 81 | Temp 97.8°F | Resp 18 | Ht 64.0 in | Wt 127.6 lb

## 2021-01-19 DIAGNOSIS — C221 Intrahepatic bile duct carcinoma: Secondary | ICD-10-CM

## 2021-01-19 LAB — CMP (CANCER CENTER ONLY)
ALT: 78 U/L — ABNORMAL HIGH (ref 0–44)
AST: 23 U/L (ref 15–41)
Albumin: 4.4 g/dL (ref 3.5–5.0)
Alkaline Phosphatase: 204 U/L — ABNORMAL HIGH (ref 38–126)
Anion gap: 7 (ref 5–15)
BUN: 12 mg/dL (ref 8–23)
CO2: 26 mmol/L (ref 22–32)
Calcium: 9.5 mg/dL (ref 8.9–10.3)
Chloride: 106 mmol/L (ref 98–111)
Creatinine: 0.59 mg/dL (ref 0.44–1.00)
GFR, Estimated: >60 mL/min (ref >60)
Glucose, Bld: 99 mg/dL (ref 70–99)
Potassium: 3.8 mmol/L (ref 3.5–5.1)
Sodium: 139 mmol/L (ref 135–145)
Total Bilirubin: 0.6 mg/dL (ref 0.3–1.2)
Total Protein: 6.6 g/dL (ref 6.5–8.1)

## 2021-01-19 MED ORDER — SODIUM CHLORIDE (PF) 0.9 % IJ SOLN
Freq: Once | INTRAMUSCULAR | Status: AC
  Filled 2021-01-19: qty 5

## 2021-01-19 NOTE — Progress Notes (Signed)
At patient request, faxed CMP results to Dr. Tilda Franco [phone]

## 2021-01-19 NOTE — Progress Notes (Signed)
[place] OFFICE PROGRESS NOTE   Diagnosis: Cholangiocarcinoma  INTERVAL HISTORY:   Angela Fleming returns as scheduled.  She underwent restaging CTs at [facility] on 12/26/2020.  There was no evidence of metastatic disease.  She underwent an ERCP and placement of a new right hepatic duct stent on 01/12/2021.  She reports feeling well at present.  Objective:  Vital signs in last 24 hours:  Blood pressure 128/73, pulse 81, temperature 97.8 F (36.6 C), resp. rate 18, height 5' 4" (1.626 m), weight 127 lb 9.6 oz (57.9 kg), SpO2 100 %.   Resp: Lungs clear bilaterally Cardio: Regular rate and rhythm GI: No mass, no hepatomegaly, left abdomen infusion pump Vascular: No leg edema    Portacath/PICC-without erythema  Lab Results:  Lab Results  Component Value Date   WBC 5.0 10/07/2020   HGB 12.2 10/07/2020   HCT 37.9 10/07/2020   MCV 90.2 10/07/2020   PLT 185 10/07/2020   NEUTROABS 3.0 10/07/2020    CMP  Lab Results  Component Value Date   NA 139 01/19/2021   K 3.8 01/19/2021   CL 106 01/19/2021   CO2 26 01/19/2021   GLUCOSE 99 01/19/2021   BUN 12 01/19/2021   CREATININE 0.59 01/19/2021   CALCIUM 9.5 01/19/2021   PROT 6.6 01/19/2021   ALBUMIN 4.4 01/19/2021   AST 23 01/19/2021   ALT 78 (H) 01/19/2021   ALKPHOS 204 (H) 01/19/2021   BILITOT 0.6 01/19/2021   GFRNONAA >60 01/19/2021   GFRAA >60 05/13/2019    Lab Results  Component Value Date   CEA1 1.02 01/02/2019   YIR485 5 01/02/2019    Medications: I have reviewed the patient's current medications.   Assessment/Plan: Intrahepatic cholangiocarcinoma Abdominal ultrasound 10/08/2018-heterogenous right hepatic mass MRI abdomen 10/14/2018-2 enhancing right liver lesions, 4.8 x 4.5 x 4.1 cm, more superior lesion measured 3 x 2 x 2.5 cm, multiple additional subcentimeter T2 hyperintense lesions-too small to characterize. CT chest 11/04/2018-6 mm left upper lobe groundglass nodule, no evidence of metastatic  disease in the chest Biopsy of right liver lesion at [facility] 11/10/2018-moderately differentiated adenocarcinoma consistent with cholangiocarcinoma; IDH1 alteration; MSI stable; mismatch repair status proficient; tumor mutational burden low, 3; PD-L1 negative CTs at [facility] 12/12/2018-right hepatic lobe masses, satellite lesions adjacent to the dominant mass, prominent periportal and aortocaval lymph nodes prominent left supraclavicular node Cycle 1 gemcitabine/cisplatin at [place] on 12/12/2018 Cycle 2 gemcitabine/cisplatin 01/02/2019, 01/09/2019 Cycle 3 gemcitabine/cisplatin 01/23/2019 CTs at [facility] 02/02/2019-decrease in size of multiple hepatic masses, decreased size of prominent periportal and aortocaval nodes, unchanged prominent left supraclavicular node Cycle 4 gemcitabine/cisplatin 02/11/2019, Udenyca added and gemcitabine dose reduced with day 8 chemotherapy Cycle 5 gemcitabine/cisplatin 03/04/2019, gemcitabine reescalated to full dose Cycle 6 gemcitabine/cisplatin 03/25/2019 Cycle 7 gemcitabine/cisplatin 04/15/2019 Cycle 8 gemcitabine/cisplatin 05/06/2019 CT 05/18/2019-decreased size of multiple right hepatic masses 06/17/2019-hepatic arterial infusion pump placed, resection of hepatic artery/portal nodes, 1/7 portal nodes positive 07/10/2019-FUDR No. 1 07/23/2019 gemcitabine 800 mg/m added 08/06/2019-FUDR No. 2, 55 mg (dose reduced due to mild increase in bilirubin (, gemcitabine 800 mg/m and oxaliplatin 68 mg/m 08/20/2019-treatment continued, Decadron and pump continue due to mild increase in alkaline phosphatase 09/17/2019, FUDR No. 3 at 28 mg (dose reduced due to continued increase in alkaline phosphatase) 10/01/2019, CTs with continued response to therapy, decrease in liver disease 10/01/2019, therapy continued 10/15/2019, therapy continue with gemcitabine and oxaliplatin, FUDR restarted at 55 mg 11/12/2019-alkaline phosphatase elevated, FUDR held, Decadron given in hepatic infusion  flush 11/26/2019 chemotherapy held  secondary to elevated liver enzymes 11/26/2019-CT with continued improvement in the hepatic tumor burden 12/10/2019-chemotherapy restarted with gemcitabine/oxaliplatin 12/24/2019 chemotherapy continued, FUDR 28 mg 02/04/2020-oxaliplatin held secondary to worsening neuropathy 03/02/2020-CTs-stable disease Treatment continued with FUDR and gemcitabine CTs 05/26/2020-unchanged treated lesions in the liver, unchanged left upper lobe pulmonary nodules, no evidence of disease progression 05/26/2020-recommendation to continue current therapy with gemcitabine plus FUDR (as labs allow) 06/09/2020-FUDR held 06/23/2020-FUDR held, continue gemcitabine, steroids given in HIA flush 07/21/2020 every 2-week Gemcitabine CTs at [facility] 08/18/2020-stable treated hepatic metastases, no evidence of new or enlarging metastatic disease Every 2-week gemcitabine continued 08/31/2020 gemcitabine, Udenyca MRI 09/16/2020 at [facility]-irregularly shaped 1.9 cm lesion in hepatic segment 5 favored to represent posttreatment fibrosis.  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  Progressively increasing intrahepatic ductal dilatation over the past several exams. ERCP at [facility] 10/03/2020-single severe localized biliary stricture was found in the common hepatic duct.  The stricture was fibrotic.  The left and right hepatic ducts and all intrahepatic branches were moderately dilated.  The upper third of the main bile duct was successfully dilated.  Plastic biliary stent was placed into the common bile duct.  Brushings were obtained in the upper third of the main bile duct-no evidence of malignancy. PET CT at [facility] 10/20/2020-decreased intrahepatic duct dilation, previously described segment 5 lesion without increased FDG activity, hypermetabolic left thyroid lesion CTs at [facility] 12/26/2020-stable tiny bilateral pulmonary nodules, stable mild Intermatic biliary ductal  dilatation, mild thickening of the common bile duct, stable subcentimeter low-attenuation liver lesion ERCP 01/12/2021-severe localized complex stenoses in the common hepatic duct, hepatic bifurcation, left main hepatic duct, and right main hepatic duct.  A plastic stent was placed into the right hepatic duct Family history of multiple cancers eluding breast, pancreatic, melanoma, appendiceal cancer, lung cancer Osteoporosis Neutropenia secondary to chemotherapy, Udenyca added with day 8 cycle 4 Hypermetabolic left thyroid nodule on PET 10/20/2020; thyroid nodule and left neck lymph node biopsy 12/12/2020-both diagnostic of malignancy, papillary thyroid carcinoma     Disposition: Angela Fleming appears stable.  She will have the hepatic infusion pump flushed today and again in 4 weeks.  She is scheduled for thyroid surgery at [facility] on 03/14/2020.  She will be scheduled for an office visit here on 03/17/2020.  Treatment for the cholangiocarcinoma remains on hold.  She is scheduled for restaging at [facility] in February.  [name], MD  01/19/2021  1:04 PM

## 2021-01-19 NOTE — Progress Notes (Signed)
Accessed Medtronic pump left upper abdomen using sterile technique without difficulty. Aspirated 5.2 ml (5.7 ml pump balance) of clear fluid (confirmed w/ipad). Instilled slowly aspirating every  3 ml for a total of 25,000 Units Heparin in 20 ml NS without difficulty. Patient tolerated procedure well. Confirmed next appointment is 02/16/2021. Discharged in good condition.

## 2021-02-15 ENCOUNTER — Telehealth: Payer: Self-pay

## 2021-02-15 NOTE — Telephone Encounter (Signed)
TCT patient regarding moving her port flush and hepatic pump refill to 2 pm tomorrow 02/16/2021. Patient was agreeable with this change.

## 2021-02-16 ENCOUNTER — Other Ambulatory Visit: Payer: Self-pay

## 2021-02-16 ENCOUNTER — Other Ambulatory Visit: Payer: No Typology Code available for payment source

## 2021-02-16 ENCOUNTER — Inpatient Hospital Stay: Payer: No Typology Code available for payment source | Attending: Oncology

## 2021-02-16 ENCOUNTER — Inpatient Hospital Stay: Payer: No Typology Code available for payment source

## 2021-02-16 VITALS — BP 113/65 | HR 85 | Temp 98.6°F | Resp 20 | Ht 64.0 in | Wt 129.1 lb

## 2021-02-16 DIAGNOSIS — C221 Intrahepatic bile duct carcinoma: Secondary | ICD-10-CM | POA: Insufficient documentation

## 2021-02-16 DIAGNOSIS — Z95828 Presence of other vascular implants and grafts: Secondary | ICD-10-CM

## 2021-02-16 DIAGNOSIS — Z452 Encounter for adjustment and management of vascular access device: Secondary | ICD-10-CM | POA: Insufficient documentation

## 2021-02-16 IMAGING — US ULTRASOUND ABDOMEN LIMITED
1 series · 13 of 25 positions shown · non-contrast
Comparison: None.

CLINICAL DATA: Right upper quadrant abdominal pain, abdominal
swelling

EXAM:
ULTRASOUND ABDOMEN LIMITED RIGHT UPPER QUADRANT

[Series 1: ultrasound abdomen limited · 0.17mm/px · 55 acquisitions, 13 frames shown]
[im 1/55]
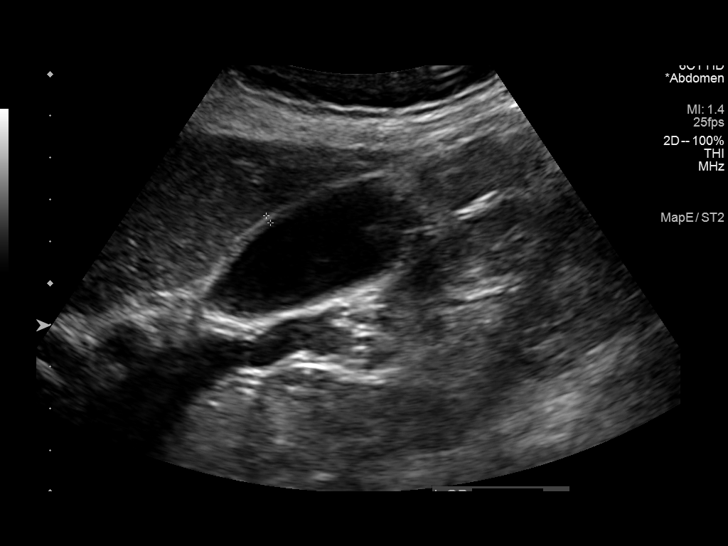
[im 5/55]
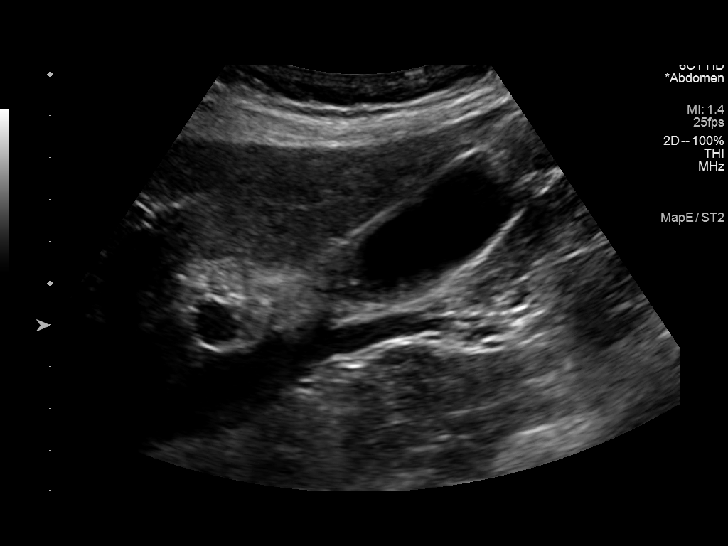
[im 10/55]
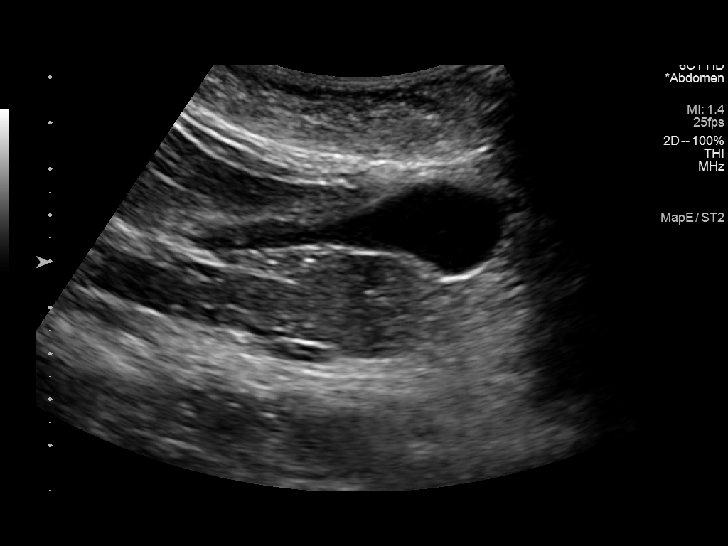
[im 14/55]
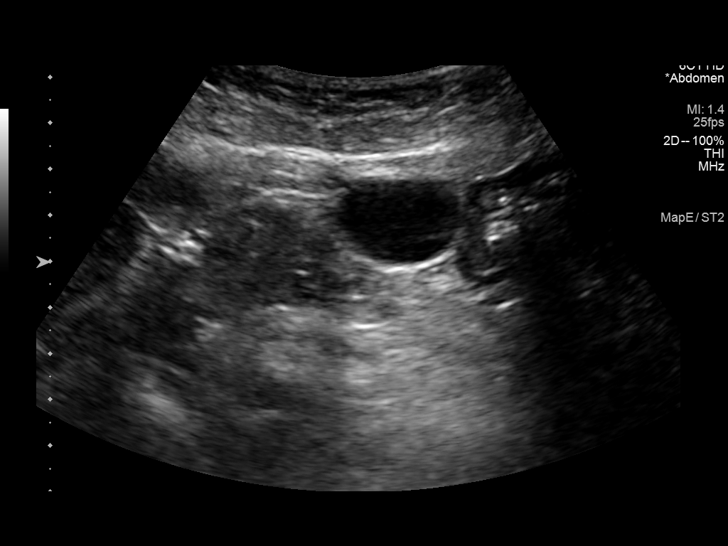
[im 19/55]
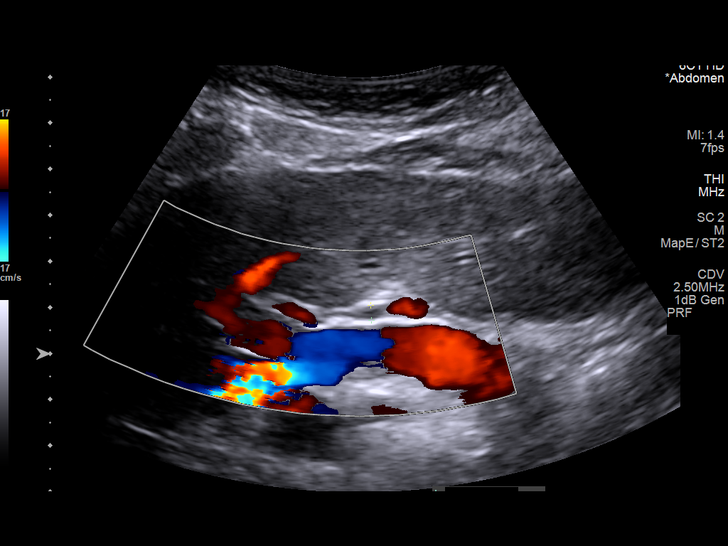
[im 23/55]
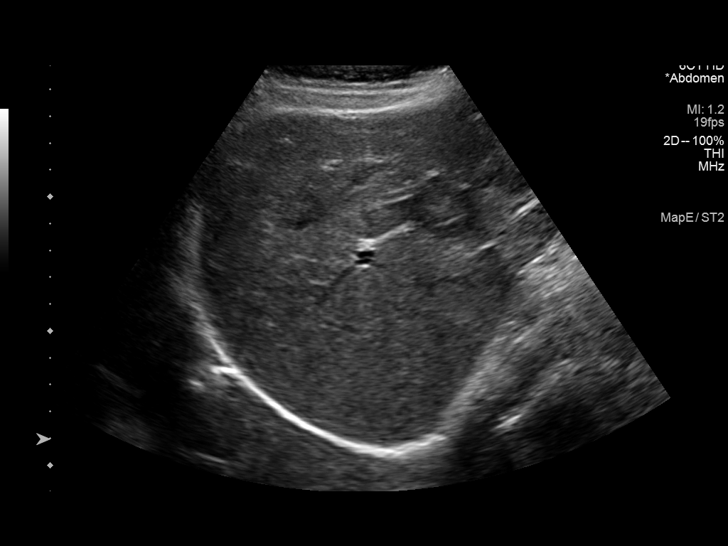
[im 28/55]
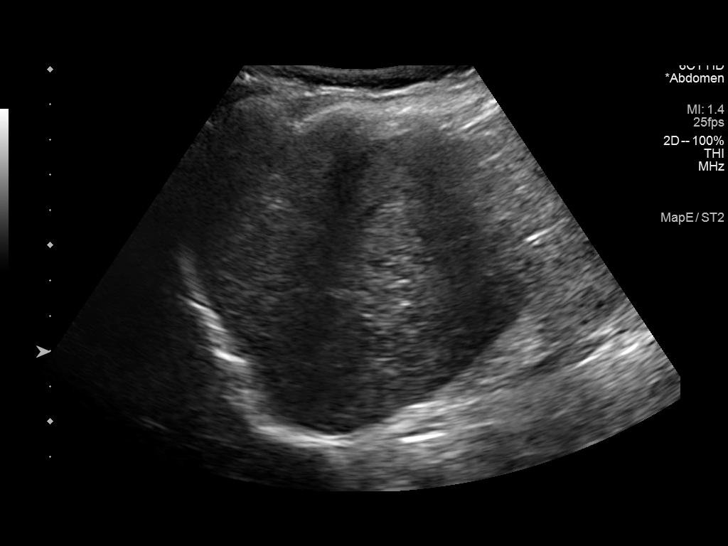
[im 32/55]
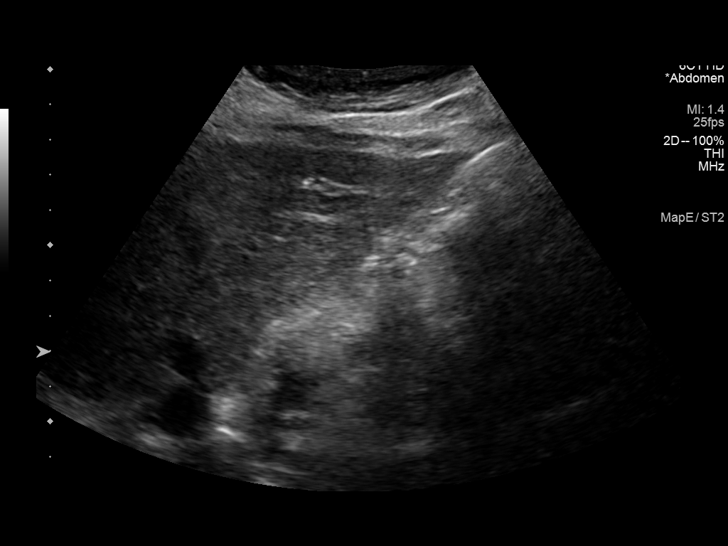
[im 37/55]
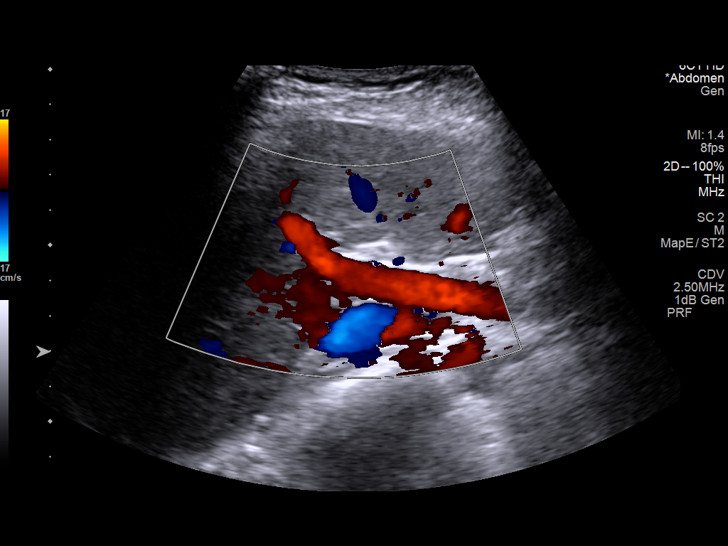
[im 41/55]
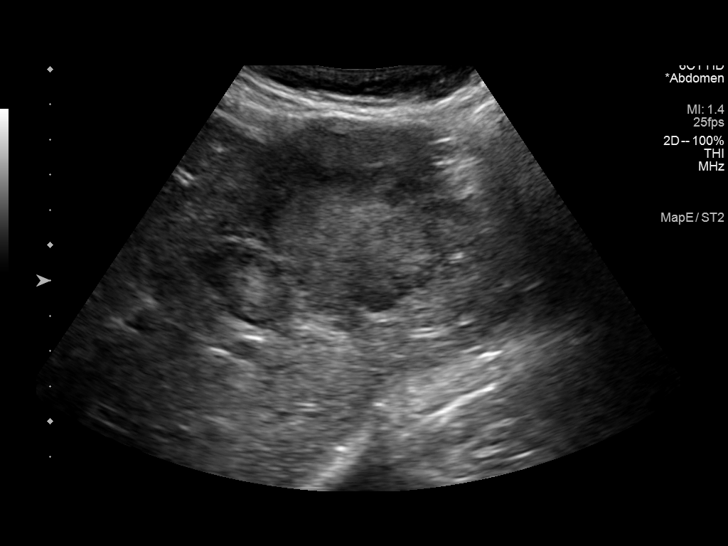
[im 46/55]
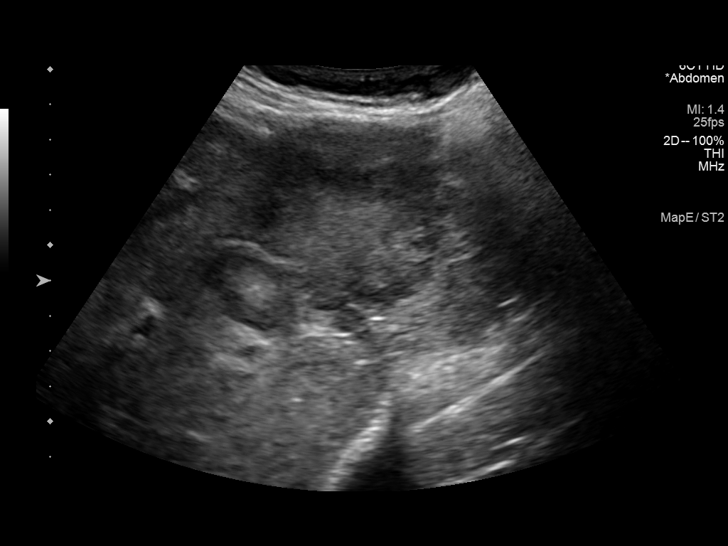
[im 50/55]
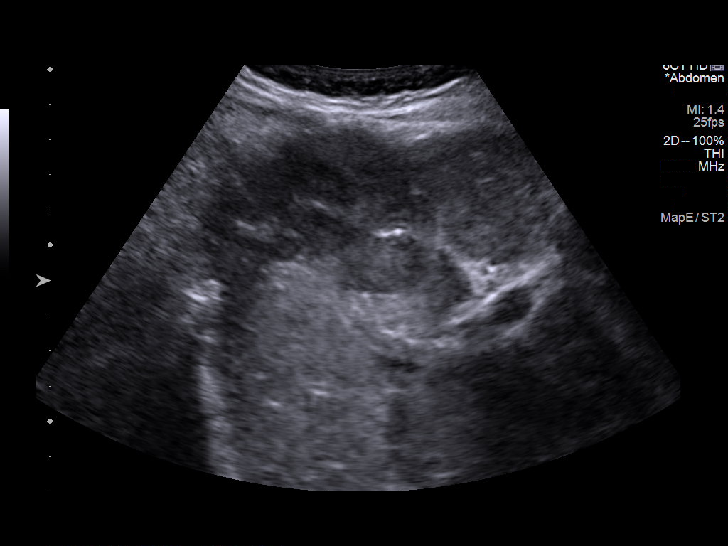
[im 55/55]
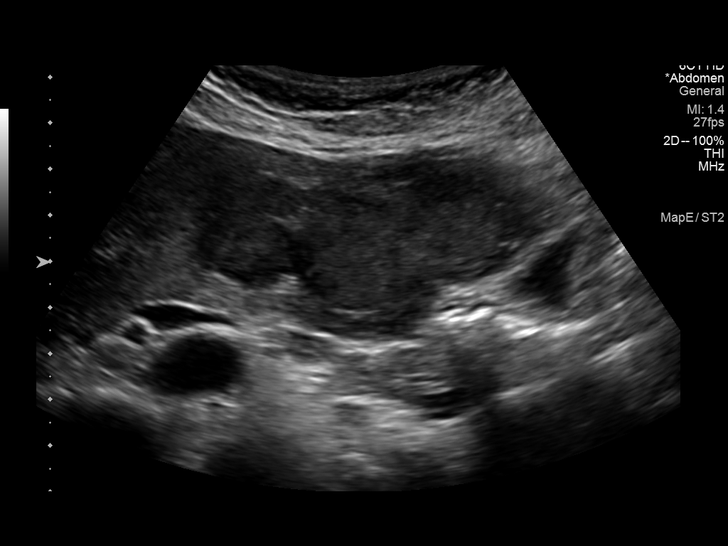

[13 of 25 positions shown; findings below may reference images not displayed]

FINDINGS: Gallbladder:

Small amount of layering debris within the gallbladder lumen. No
shadowing gallstones or wall thickening visualized. No sonographic
Murphy sign noted by sonographer.

Common bile duct:

Diameter: Mm

Liver:

There is a lobulated, heterogeneously hypoechoic mass within the
right hepatic lobe measuring approximately 8.2 x 6.1 x 6.5 cm. There
is internal vascularity within this lesion. It is uncertain whether
this represents 1 large multilobulated mass or multiple adjacent
masses. Background hepatic parenchymal echogenicity within normal
limits. Portal vein is patent with normal direction of flow.

Other: None.
IMPRESSION: 1. Heterogeneous multilobulated mass within the right hepatic lobe,
in total measuring 8.2 x 6.1 x 6.5 cm. It is uncertain whether this
represents 1 large multilobulated mass versus multiple adjacent
masses. Primary and metastatic malignancy are in the differential.
Further evaluation with multi phasic hepatic protocol CT or MRI is
recommended for further evaluation.
2. Biliary sludge.

These results will be called to the ordering clinician or
representative by the Radiologist Assistant, and communication
documented in the PACS or zVision Dashboard.

## 2021-02-16 MED ORDER — SODIUM CHLORIDE 0.9% FLUSH
10.0000 mL | Freq: Once | INTRAVENOUS | Status: AC
  Administered 2021-02-16: 10 mL

## 2021-02-16 MED ORDER — SODIUM CHLORIDE 0.9% FLUSH: 10.0000 mL | INTRAVENOUS | Status: DC | PRN

## 2021-02-16 MED ORDER — HEPARIN SOD (PORK) LOCK FLUSH 100 UNIT/ML IV SOLN
500.0000 [IU] | Freq: Once | INTRAVENOUS | Status: AC | PRN
  Administered 2021-02-16: 500 [IU]

## 2021-02-16 MED ORDER — SODIUM CHLORIDE (PF) 0.9 % IJ SOLN
Freq: Once | INTRAMUSCULAR | Status: AC
  Filled 2021-02-16: qty 5

## 2021-02-16 NOTE — Progress Notes (Signed)
Accessed Medtronic pump left upper abdomen using sterile technique without difficulty. Aspirated 3.0 ml (3.2 ml pump balance) of clear fluid (confirmed w/ipad). Instilled slowly aspirating every  3 ml for a total of 25,000 Units Heparin in 20 ml NS without difficulty. Patient tolerated procedure well. Confirmed next appointment is 03/17/2021. Discharged in good condition.

## 2021-03-15 ENCOUNTER — Encounter: Payer: Self-pay | Admitting: Nurse Practitioner

## 2021-03-17 ENCOUNTER — Inpatient Hospital Stay: Payer: Medicare Other | Attending: Oncology

## 2021-03-17 ENCOUNTER — Other Ambulatory Visit: Payer: Self-pay | Admitting: *Deleted

## 2021-03-17 ENCOUNTER — Encounter: Payer: Self-pay | Admitting: Oncology

## 2021-03-17 ENCOUNTER — Other Ambulatory Visit: Payer: No Typology Code available for payment source

## 2021-03-17 ENCOUNTER — Other Ambulatory Visit: Payer: Self-pay

## 2021-03-17 ENCOUNTER — Ambulatory Visit: Payer: No Typology Code available for payment source | Admitting: Nurse Practitioner

## 2021-03-17 ENCOUNTER — Encounter: Payer: Self-pay | Admitting: Nurse Practitioner

## 2021-03-17 ENCOUNTER — Ambulatory Visit: Payer: No Typology Code available for payment source

## 2021-03-17 VITALS — BP 111/64 | HR 77 | Temp 98.4°F | Resp 20 | Ht 64.0 in | Wt 128.1 lb

## 2021-03-17 DIAGNOSIS — C221 Intrahepatic bile duct carcinoma: Secondary | ICD-10-CM | POA: Diagnosis present

## 2021-03-17 DIAGNOSIS — Z801 Family history of malignant neoplasm of trachea, bronchus and lung: Secondary | ICD-10-CM | POA: Insufficient documentation

## 2021-03-17 DIAGNOSIS — Z803 Family history of malignant neoplasm of breast: Secondary | ICD-10-CM | POA: Diagnosis not present

## 2021-03-17 DIAGNOSIS — Z452 Encounter for adjustment and management of vascular access device: Secondary | ICD-10-CM | POA: Diagnosis not present

## 2021-03-17 DIAGNOSIS — Z800 Family history of malignant neoplasm of digestive organs: Secondary | ICD-10-CM | POA: Insufficient documentation

## 2021-03-17 DIAGNOSIS — C73 Malignant neoplasm of thyroid gland: Secondary | ICD-10-CM | POA: Diagnosis not present

## 2021-03-17 DIAGNOSIS — Z808 Family history of malignant neoplasm of other organs or systems: Secondary | ICD-10-CM | POA: Diagnosis not present

## 2021-03-17 DIAGNOSIS — Z95828 Presence of other vascular implants and grafts: Secondary | ICD-10-CM

## 2021-03-17 MED ORDER — SODIUM CHLORIDE (PF) 0.9 % IJ SOLN
Freq: Once | INTRAMUSCULAR | Status: AC
  Filled 2021-03-17: qty 5

## 2021-03-17 MED ORDER — SODIUM CHLORIDE 0.9% FLUSH
10.0000 mL | Freq: Once | INTRAVENOUS | Status: AC
  Administered 2021-03-17: 10 mL

## 2021-03-17 MED ORDER — HEPARIN SOD (PORK) LOCK FLUSH 100 UNIT/ML IV SOLN
500.0000 [IU] | Freq: Once | INTRAVENOUS | Status: AC | PRN
  Administered 2021-03-17: 500 [IU]

## 2021-03-17 MED ORDER — SODIUM CHLORIDE 0.9% FLUSH: 10.0000 mL | INTRAVENOUS | Status: DC | PRN

## 2021-03-17 NOTE — Progress Notes (Signed)
Accessed Medtronic pump left upper abdomen using sterile technique without difficulty. Aspirated 3.0 ml (3.0 ml pump balance) of clear fluid (confirmed w/ipad). Instilled slowly aspirating every  3 ml for a total of 25,000 Units Heparin in 20 ml NS without difficulty. Patient tolerated procedure well. Confirmed next appointment is 04/14/2021. Discharged in good condition.

## 2021-03-17 NOTE — Patient Instructions (Signed)
Heparin injection What is this medication? HEPARIN (HEP a rin) is an anticoagulant. It is used to treat or prevent clots in the veins, arteries, lungs, or heart. It stops clots from forming or getting bigger. This medicine prevents clotting during open-heart surgery, dialysis, or in patients who are confined to bed. This medicine may be used for other purposes; ask your health care provider or pharmacist if you have questions. COMMON BRAND NAME(S): Hep-Lock, Hep-Lock U/P, Hepflush-10, Monoject Prefill Advanced Heparin Lock Flush, SASH Normal Saline and Heparin What should I tell my care team before I take this medication? They need to know if you have any of these conditions: bleeding disorders, such as hemophilia or low blood platelets bowel disease or diverticulitis endocarditis high blood pressure liver disease recent surgery or delivery of a baby stomach ulcers an unusual or allergic reaction to heparin, benzyl alcohol, sulfites, other medicines, foods, dyes, or preservatives pregnant or trying to get pregnant breast-feeding How should I use this medication? This medicine is given by injection or infusion into a vein. It can also be given by injection of small amounts under the skin. It is usually given by a health care professional in a hospital or clinic setting. If you get this medicine at home, you will be taught how to prepare and give this medicine. Use exactly as directed. Take your medicine at regular intervals. Do not take it more often than directed. Do not stop taking except on your doctor's advice. Stopping this medicine may increase your risk of a blot clot. Be sure to refill your prescription before you run out of medicine. It is important that you put your used needles and syringes in a special sharps container. Do not put them in a trash can. If you do not have a sharps container, call your pharmacist or healthcare provider to get one. Talk to your pediatrician regarding the  use of this medicine in children. While this medicine may be prescribed for children for selected conditions, precautions do apply. Overdosage: If you think you have taken too much of this medicine contact a poison control center or emergency room at once. NOTE: This medicine is only for you. Do not share this medicine with others. What if I miss a dose? If you miss a dose, take it as soon as you can. If it is almost time for your next dose, take only that dose. Do not take double or extra doses. What may interact with this medication? Do not take this medicine with any of the following medications: aspirin and aspirin-like drugs mifepristone medicines that treat or prevent blood clots like warfarin, enoxaparin, and dalteparin palifermin protamine This medicine may also interact with the following medications: dextran digoxin hydroxychloroquine medicines for treating colds or allergies nicotine NSAIDs, medicines for pain and inflammation, like ibuprofen or naproxen phenylbutazone tetracycline antibiotics This list may not describe all possible interactions. Give your health care provider a list of all the medicines, herbs, non-prescription drugs, or dietary supplements you use. Also tell them if you smoke, drink alcohol, or use illegal drugs. Some items may interact with your medicine. What should I watch for while using this medication? Visit your healthcare professional for regular checks on your progress. You may need blood work done while you are taking this medicine. Your condition will be monitored carefully while you are receiving this medicine. It is important not to miss any appointments. Wear a medical ID bracelet or chain, and carry a card that describes your disease and details  of your medicine and dosage times. Notify your doctor or healthcare professional at once if you have cold, blue hands or feet. If you are going to need surgery or other procedure, tell your healthcare  professional that you are using this medicine. Avoid sports and activities that might cause injury while you are using this medicine. Severe falls or injuries can cause unseen bleeding. Be careful when using sharp tools or knives. Consider using an Copy. Take special care brushing or flossing your teeth. Report any injuries, bruising, or red spots on the skin to your healthcare professional. Using this medicine for a long time may weaken your bones and increase the risk of bone fractures. You should make sure that you get enough calcium and vitamin D while you are taking this medicine. Discuss the foods you eat and the vitamins you take with your healthcare professional. Wear a medical ID bracelet or chain. Carry a card that describes your disease and details of your medicine and dosage times. What side effects may I notice from receiving this medication? Side effects that you should report to your doctor or health care professional as soon as possible: allergic reactions like skin rash, itching or hives, swelling of the face, lips, or tongue bone pain fever, chills nausea, vomiting signs and symptoms of bleeding such as bloody or black, tarry stools; red or dark-brown urine; spitting up blood or brown material that looks like coffee grounds; red spots on the skin; unusual bruising or bleeding from the eye, gums, or nose signs and symptoms of a blood clot such as chest pain; shortness of breath; pain, swelling, or warmth in the leg signs and symptoms of a stroke such as changes in vision; confusion; trouble speaking or understanding; severe headaches; sudden numbness or weakness of the face, arm or leg; trouble walking; dizziness; loss of coordination Side effects that usually do not require medical attention (report to your doctor or health care professional if they continue or are bothersome): hair loss pain, redness, or irritation at site where injected This list may not describe all  possible side effects. Call your doctor for medical advice about side effects. You may report side effects to FDA at 1-800-FDA-1088. Where should I keep my medication? Keep out of the reach of children. Store unopened vials at room temperature between 15 and 30 degrees C (59 and 86 degrees F). Do not freeze. Do not use if solution is discolored or particulate matter is present. Throw away any unused medicine after the expiration date. NOTE: This sheet is a summary. It may not cover all possible information. If you have questions about this medicine, talk to your doctor, pharmacist, or health care provider.  2022 Elsevier/Gold Standard (2020-03-10 00:00:00)

## 2021-03-17 NOTE — Progress Notes (Signed)
Patient reports that surgeon at suggest she obtain a local endocrinologist for continued follow up and management of her papillary thyroid cancer.  She agrees to Dr. Delrae Rend w[facility]. Referral order, demographics and chart information faxed to [phone].

## 2021-04-03 ENCOUNTER — Encounter: Payer: Self-pay | Admitting: *Deleted

## 2021-04-04 ENCOUNTER — Encounter: Payer: Self-pay | Admitting: Oncology

## 2021-04-05 ENCOUNTER — Other Ambulatory Visit: Payer: Self-pay

## 2021-04-05 DIAGNOSIS — C73 Malignant neoplasm of thyroid gland: Secondary | ICD-10-CM

## 2021-04-11 ENCOUNTER — Inpatient Hospital Stay: Payer: Medicare Other

## 2021-04-11 ENCOUNTER — Other Ambulatory Visit: Payer: Self-pay

## 2021-04-11 ENCOUNTER — Encounter: Payer: Self-pay | Admitting: Nurse Practitioner

## 2021-04-11 ENCOUNTER — Inpatient Hospital Stay (HOSPITAL_BASED_OUTPATIENT_CLINIC_OR_DEPARTMENT_OTHER): Payer: Medicare Other | Admitting: Nurse Practitioner

## 2021-04-11 VITALS — BP 116/68 | HR 81 | Temp 97.8°F | Resp 20 | Ht 64.0 in | Wt 127.0 lb

## 2021-04-11 DIAGNOSIS — C73 Malignant neoplasm of thyroid gland: Secondary | ICD-10-CM

## 2021-04-11 DIAGNOSIS — Z452 Encounter for adjustment and management of vascular access device: Secondary | ICD-10-CM | POA: Diagnosis not present

## 2021-04-11 DIAGNOSIS — Z95828 Presence of other vascular implants and grafts: Secondary | ICD-10-CM

## 2021-04-11 DIAGNOSIS — C221 Intrahepatic bile duct carcinoma: Secondary | ICD-10-CM | POA: Diagnosis not present

## 2021-04-11 MED ORDER — ALTEPLASE 2 MG IJ SOLR: 2.0000 mg | Freq: Once | INTRAMUSCULAR | Status: DC | PRN

## 2021-04-11 MED ORDER — SODIUM CHLORIDE (PF) 0.9 % IJ SOLN
Freq: Once | INTRAMUSCULAR | Status: AC
  Filled 2021-04-11: qty 5

## 2021-04-11 NOTE — Progress Notes (Signed)
Accessed Medtronic pump left upper abdomen using sterile technique without difficulty. Aspirated 5.0 ml (5.0 ml pump balance) of clear fluid (confirmed w/ipad). Instilled slowly aspirating every  3 ml for a total of 25,000 Units Heparin in 20 ml NS without difficulty. Patient tolerated procedure well. Confirmed next appointment is 05/10/2021. Discharged in good condition.

## 2021-04-11 NOTE — Progress Notes (Signed)
[facility] OFFICE PROGRESS NOTE   Diagnosis: Cholangiocarcinoma  INTERVAL HISTORY:   [name] returns as scheduled.  She in general is feeling well.  She denies fever.  No pruritus.  She has a good appetite.  She denies pain.  She reports having a "cold".  She was seen at urgent care last week, reports a negative COVID test.  She is utilizing Flonase and Mucinex.  This morning she woke up with crusty drainage in both eyes.  The drainage has not persisted.  Objective:  Vital signs in last 24 hours:  Blood pressure 116/68, pulse 81, temperature 97.8 F (36.6 C), temperature source Oral, resp. rate 20, height 5' 4"  (1.626 m), weight 127 lb (57.6 kg), SpO2 100 %.    HEENT: Bilateral conjunctival erythema right greater than left.  No drainage noted. Resp: Lungs clear bilaterally. Cardio: Regular rate and rhythm. GI: No hepatomegaly.  Left abdomen hepatic infusion pump. Vascular: No leg edema. Skin: No rash. Port-A-Cath without erythema.  Lab Results:  Lab Results  Component Value Date   WBC 5.0 10/07/2020   HGB 12.2 10/07/2020   HCT 37.9 10/07/2020   MCV 90.2 10/07/2020   PLT 185 10/07/2020   NEUTROABS 3.0 10/07/2020    Imaging:  No results found.  Medications: I have reviewed the patient's current medications.  Assessment/Plan: Intrahepatic cholangiocarcinoma Abdominal ultrasound 10/08/2018-heterogenous right hepatic mass MRI abdomen 10/14/2018-2 enhancing right liver lesions, 4.8 x 4.5 x 4.1 cm, more superior lesion measured 3 x 2 x 2.5 cm, multiple additional subcentimeter T2 hyperintense lesions-too small to characterize. CT chest 11/04/2018-6 mm left upper lobe groundglass nodule, no evidence of metastatic disease in the chest Biopsy of right liver lesion at [facility] 11/10/2018-moderately differentiated adenocarcinoma consistent with cholangiocarcinoma; IDH1 alteration; MSI stable; mismatch repair status proficient; tumor mutational burden low, 3;  PD-L1 negative CTs at [facility] 12/12/2018-right hepatic lobe masses, satellite lesions adjacent to the dominant mass, prominent periportal and aortocaval lymph nodes prominent left supraclavicular node Cycle 1 gemcitabine/cisplatin at [place] on 12/12/2018 Cycle 2 gemcitabine/cisplatin 01/02/2019, 01/09/2019 Cycle 3 gemcitabine/cisplatin 01/23/2019 CTs at [facility] 02/02/2019-decrease in size of multiple hepatic masses, decreased size of prominent periportal and aortocaval nodes, unchanged prominent left supraclavicular node Cycle 4 gemcitabine/cisplatin 02/11/2019, Udenyca added and gemcitabine dose reduced with day 8 chemotherapy Cycle 5 gemcitabine/cisplatin 03/04/2019, gemcitabine reescalated to full dose Cycle 6 gemcitabine/cisplatin 03/25/2019 Cycle 7 gemcitabine/cisplatin 04/15/2019 Cycle 8 gemcitabine/cisplatin 05/06/2019 CT 05/18/2019-decreased size of multiple right hepatic masses 06/17/2019-hepatic arterial infusion pump placed, resection of hepatic artery/portal nodes, 1/7 portal nodes positive 07/10/2019-FUDR No. 1 07/23/2019 gemcitabine 800 mg/m added 08/06/2019-FUDR No. 2, 55 mg (dose reduced due to mild increase in bilirubin (, gemcitabine 800 mg/m and oxaliplatin 68 mg/m 08/20/2019-treatment continued, Decadron and pump continue due to mild increase in alkaline phosphatase 09/17/2019, FUDR No. 3 at 28 mg (dose reduced due to continued increase in alkaline phosphatase) 10/01/2019, CTs with continued response to therapy, decrease in liver disease 10/01/2019, therapy continued 10/15/2019, therapy continue with gemcitabine and oxaliplatin, FUDR restarted at 55 mg 11/12/2019-alkaline phosphatase elevated, FUDR held, Decadron given in hepatic infusion flush 11/26/2019 chemotherapy held secondary to elevated liver enzymes 11/26/2019-CT with continued improvement in the hepatic tumor burden 12/10/2019-chemotherapy restarted with gemcitabine/oxaliplatin 12/24/2019 chemotherapy continued, FUDR 28  mg 02/04/2020-oxaliplatin held secondary to worsening neuropathy 03/02/2020-CTs-stable disease Treatment continued with FUDR and gemcitabine CTs 05/26/2020-unchanged treated lesions in the liver, unchanged left upper lobe pulmonary nodules, no evidence of disease progression 05/26/2020-recommendation to continue current therapy with gemcitabine  plus FUDR (as labs allow) 06/09/2020-FUDR held 06/23/2020-FUDR held, continue gemcitabine, steroids given in HIA flush 07/21/2020 every 2-week Gemcitabine CTs at [facility] 08/18/2020-stable treated hepatic metastases, no evidence of new or enlarging metastatic disease Every 2-week gemcitabine continued 08/31/2020 gemcitabine, Udenyca MRI 09/16/2020 at [facility]-irregularly shaped 1.9 cm lesion in hepatic segment 5 favored to represent posttreatment fibrosis.  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  Progressively increasing intrahepatic ductal dilatation over the past several exams. ERCP at [facility] 10/03/2020-single severe localized biliary stricture was found in the common hepatic duct.  The stricture was fibrotic.  The left and right hepatic ducts and all intrahepatic branches were moderately dilated.  The upper third of the main bile duct was successfully dilated.  Plastic biliary stent was placed into the common bile duct.  Brushings were obtained in the upper third of the main bile duct-no evidence of malignancy. PET CT at [facility] 10/20/2020-decreased intrahepatic duct dilation, previously described segment 5 lesion without increased FDG activity, hypermetabolic left thyroid lesion CTs at [facility] 12/26/2020-stable tiny bilateral pulmonary nodules, stable mild Intermatic biliary ductal dilatation, mild thickening of the common bile duct, stable subcentimeter low-attenuation liver lesion ERCP 01/12/2021-severe localized complex stenoses in the common hepatic duct, hepatic bifurcation, left main hepatic duct, and right main hepatic  duct.  A plastic stent was placed into the right hepatic duct CTs 03/29/2021-similar appearance of liver lesions and delayed enhancing mass at the tip of the right lobe of the liver.  Similar bilateral pulmonary nodules. Family history of multiple cancers eluding breast, pancreatic, melanoma, appendiceal cancer, lung cancer Osteoporosis Neutropenia secondary to chemotherapy, Udenyca added with day 8 cycle 4 Hypermetabolic left thyroid nodule on PET 10/20/2020; thyroid nodule and left neck lymph node biopsy 12/12/2020-both diagnostic of malignancy, papillary thyroid carcinoma; thyroidectomy 03/14/2021 with pathology revealing papillary thyroid cancer with 1 of 8 nodes positive      Disposition: Ms. Fogarty appears stable.  The recent CT scans show stable disease.  Next scans at a 43-monthinterval.  She has an appointment with Dr. KBuddy Dutytomorrow for follow-up/management of the papillary thyroid cancer.  She will return for port flush and hepatic infusion pump flush in 4 weeks and 8 weeks.  We will see her in follow-up in 12 weeks.  She will contact the office in the interim with any problems.  Patient seen with Dr. SLuberta RobertsonANP/GNP-BC   04/11/2021  1:42 PM  This was a shared visit with LNed Card  Ms. GForsmanwas interviewed and examined.  She will follow-up with Dr. KBuddy Dutyfor management of the papillary thyroid cancer. [facility]  [facility].  We continue flushing the hepatic infusion pump and Port-A-Cath.  I was present for greater than 50% of today's visit.  I performed medical decision making.  BJulieanne Manson MD

## 2021-04-11 NOTE — Patient Instructions (Signed)
Heparin injection What is this medication? HEPARIN (HEP a rin) is an anticoagulant. It is used to treat or prevent clots in the veins, arteries, lungs, or heart. It stops clots from forming or getting bigger. This medicine prevents clotting during open-heart surgery, dialysis, or in patients who are confined to bed. This medicine may be used for other purposes; ask your health care provider or pharmacist if you have questions. COMMON BRAND NAME(S): Hep-Lock, Hep-Lock U/P, Hepflush-10, Monoject Prefill Advanced Heparin Lock Flush, SASH Normal Saline and Heparin What should I tell my care team before I take this medication? They need to know if you have any of these conditions: bleeding disorders, such as hemophilia or low blood platelets bowel disease or diverticulitis endocarditis high blood pressure liver disease recent surgery or delivery of a baby stomach ulcers an unusual or allergic reaction to heparin, benzyl alcohol, sulfites, other medicines, foods, dyes, or preservatives pregnant or trying to get pregnant breast-feeding How should I use this medication? This medicine is given by injection or infusion into a vein. It can also be given by injection of small amounts under the skin. It is usually given by a health care professional in a hospital or clinic setting. If you get this medicine at home, you will be taught how to prepare and give this medicine. Use exactly as directed. Take your medicine at regular intervals. Do not take it more often than directed. Do not stop taking except on your doctor's advice. Stopping this medicine may increase your risk of a blot clot. Be sure to refill your prescription before you run out of medicine. It is important that you put your used needles and syringes in a special sharps container. Do not put them in a trash can. If you do not have a sharps container, call your pharmacist or healthcare provider to get one. Talk to your pediatrician regarding the  use of this medicine in children. While this medicine may be prescribed for children for selected conditions, precautions do apply. Overdosage: If you think you have taken too much of this medicine contact a poison control center or emergency room at once. NOTE: This medicine is only for you. Do not share this medicine with others. What if I miss a dose? If you miss a dose, take it as soon as you can. If it is almost time for your next dose, take only that dose. Do not take double or extra doses. What may interact with this medication? Do not take this medicine with any of the following medications: aspirin and aspirin-like drugs mifepristone medicines that treat or prevent blood clots like warfarin, enoxaparin, and dalteparin palifermin protamine This medicine may also interact with the following medications: dextran digoxin hydroxychloroquine medicines for treating colds or allergies nicotine NSAIDs, medicines for pain and inflammation, like ibuprofen or naproxen phenylbutazone tetracycline antibiotics This list may not describe all possible interactions. Give your health care provider a list of all the medicines, herbs, non-prescription drugs, or dietary supplements you use. Also tell them if you smoke, drink alcohol, or use illegal drugs. Some items may interact with your medicine. What should I watch for while using this medication? Visit your healthcare professional for regular checks on your progress. You may need blood work done while you are taking this medicine. Your condition will be monitored carefully while you are receiving this medicine. It is important not to miss any appointments. Wear a medical ID bracelet or chain, and carry a card that describes your disease and details  of your medicine and dosage times. Notify your doctor or healthcare professional at once if you have cold, blue hands or feet. If you are going to need surgery or other procedure, tell your healthcare  professional that you are using this medicine. Avoid sports and activities that might cause injury while you are using this medicine. Severe falls or injuries can cause unseen bleeding. Be careful when using sharp tools or knives. Consider using an Copy. Take special care brushing or flossing your teeth. Report any injuries, bruising, or red spots on the skin to your healthcare professional. Using this medicine for a long time may weaken your bones and increase the risk of bone fractures. You should make sure that you get enough calcium and vitamin D while you are taking this medicine. Discuss the foods you eat and the vitamins you take with your healthcare professional. Wear a medical ID bracelet or chain. Carry a card that describes your disease and details of your medicine and dosage times. What side effects may I notice from receiving this medication? Side effects that you should report to your doctor or health care professional as soon as possible: allergic reactions like skin rash, itching or hives, swelling of the face, lips, or tongue bone pain fever, chills nausea, vomiting signs and symptoms of bleeding such as bloody or black, tarry stools; red or dark-brown urine; spitting up blood or brown material that looks like coffee grounds; red spots on the skin; unusual bruising or bleeding from the eye, gums, or nose signs and symptoms of a blood clot such as chest pain; shortness of breath; pain, swelling, or warmth in the leg signs and symptoms of a stroke such as changes in vision; confusion; trouble speaking or understanding; severe headaches; sudden numbness or weakness of the face, arm or leg; trouble walking; dizziness; loss of coordination Side effects that usually do not require medical attention (report to your doctor or health care professional if they continue or are bothersome): hair loss pain, redness, or irritation at site where injected This list may not describe all  possible side effects. Call your doctor for medical advice about side effects. You may report side effects to FDA at 1-800-FDA-1088. Where should I keep my medication? Keep out of the reach of children. Store unopened vials at room temperature between 15 and 30 degrees C (59 and 86 degrees F). Do not freeze. Do not use if solution is discolored or particulate matter is present. Throw away any unused medicine after the expiration date. NOTE: This sheet is a summary. It may not cover all possible information. If you have questions about this medicine, talk to your doctor, pharmacist, or health care provider.  2022 Elsevier/Gold Standard (2020-03-10 00:00:00)

## 2021-04-12 ENCOUNTER — Other Ambulatory Visit: Payer: Self-pay | Admitting: Internal Medicine

## 2021-04-12 ENCOUNTER — Other Ambulatory Visit (HOSPITAL_BASED_OUTPATIENT_CLINIC_OR_DEPARTMENT_OTHER): Payer: Self-pay | Admitting: Internal Medicine

## 2021-04-12 DIAGNOSIS — C73 Malignant neoplasm of thyroid gland: Secondary | ICD-10-CM

## 2021-04-17 ENCOUNTER — Encounter: Payer: Self-pay | Admitting: Oncology

## 2021-04-19 ENCOUNTER — Encounter: Payer: Self-pay | Admitting: Oncology

## 2021-04-20 ENCOUNTER — Other Ambulatory Visit: Payer: Self-pay

## 2021-04-20 ENCOUNTER — Ambulatory Visit (HOSPITAL_BASED_OUTPATIENT_CLINIC_OR_DEPARTMENT_OTHER)
Admission: RE | Admit: 2021-04-20 | Discharge: 2021-04-20 | Disposition: A | Discharge status: Home / Self Care | Payer: Medicare Other | Source: Ambulatory Visit | Attending: Internal Medicine | Admitting: Internal Medicine

## 2021-04-20 DIAGNOSIS — C73 Malignant neoplasm of thyroid gland: Secondary | ICD-10-CM | POA: Diagnosis not present

## 2021-05-02 ENCOUNTER — Encounter: Payer: Self-pay | Admitting: Oncology

## 2021-05-09 ENCOUNTER — Other Ambulatory Visit: Payer: Self-pay

## 2021-05-09 ENCOUNTER — Encounter: Payer: Self-pay | Admitting: Oncology

## 2021-05-09 ENCOUNTER — Inpatient Hospital Stay: Payer: Medicare Other | Attending: Oncology

## 2021-05-09 VITALS — BP 106/73 | HR 77 | Temp 98.1°F | Resp 18 | Ht 64.0 in | Wt 125.0 lb

## 2021-05-09 DIAGNOSIS — C221 Intrahepatic bile duct carcinoma: Secondary | ICD-10-CM | POA: Insufficient documentation

## 2021-05-09 DIAGNOSIS — Z452 Encounter for adjustment and management of vascular access device: Secondary | ICD-10-CM | POA: Insufficient documentation

## 2021-05-09 DIAGNOSIS — Z95828 Presence of other vascular implants and grafts: Secondary | ICD-10-CM

## 2021-05-09 MED ORDER — SODIUM CHLORIDE 0.9% FLUSH: 10.0000 mL | INTRAVENOUS | Status: DC | PRN

## 2021-05-09 MED ORDER — SODIUM CHLORIDE (PF) 0.9 % IJ SOLN
Freq: Once | INTRAMUSCULAR | Status: AC
  Filled 2021-05-09: qty 5

## 2021-05-09 MED ORDER — SODIUM CHLORIDE 0.9% FLUSH
10.0000 mL | Freq: Once | INTRAVENOUS | Status: AC
  Administered 2021-05-09: 10 mL

## 2021-05-09 MED ORDER — HEPARIN SOD (PORK) LOCK FLUSH 100 UNIT/ML IV SOLN
500.0000 [IU] | Freq: Once | INTRAVENOUS | Status: AC | PRN
  Administered 2021-05-09: 500 [IU]

## 2021-05-09 NOTE — Patient Instructions (Signed)
Heparin injection ?What is this medication? ?HEPARIN (HEP a rin) is an anticoagulant. It is used to treat or prevent clots in the veins, arteries, lungs, or heart. It stops clots from forming or getting bigger. This medicine prevents clotting during open-heart surgery, dialysis, or in patients who are confined to bed. ?This medicine may be used for other purposes; ask your health care provider or pharmacist if you have questions. ?COMMON BRAND NAME(S): Hep-Lock, Hep-Lock U/P, Hepflush-10, Monoject Prefill Advanced Heparin Lock Flush, SASH Normal Saline and Heparin ?What should I tell my care team before I take this medication? ?They need to know if you have any of these conditions: ?bleeding disorders, such as hemophilia or low blood platelets ?bowel disease or diverticulitis ?endocarditis ?high blood pressure ?liver disease ?recent surgery or delivery of a baby ?stomach ulcers ?an unusual or allergic reaction to heparin, benzyl alcohol, sulfites, other medicines, foods, dyes, or preservatives ?pregnant or trying to get pregnant ?breast-feeding ?How should I use this medication? ?This medicine is given by injection or infusion into a vein. It can also be given by injection of small amounts under the skin. It is usually given by a health care professional in a hospital or clinic setting. ?If you get this medicine at home, you will be taught how to prepare and give this medicine. Use exactly as directed. Take your medicine at regular intervals. Do not take it more often than directed. Do not stop taking except on your doctor's advice. Stopping this medicine may increase your risk of a blot clot. Be sure to refill your prescription before you run out of medicine. ?It is important that you put your used needles and syringes in a special sharps container. Do not put them in a trash can. If you do not have a sharps container, call your pharmacist or healthcare provider to get one. ?Talk to your pediatrician regarding the  use of this medicine in children. While this medicine may be prescribed for children for selected conditions, precautions do apply. ?Overdosage: If you think you have taken too much of this medicine contact a poison control center or emergency room at once. ?NOTE: This medicine is only for you. Do not share this medicine with others. ?What if I miss a dose? ?If you miss a dose, take it as soon as you can. If it is almost time for your next dose, take only that dose. Do not take double or extra doses. ?What may interact with this medication? ?Do not take this medicine with any of the following medications: ?aspirin and aspirin-like drugs ?mifepristone ?medicines that treat or prevent blood clots like warfarin, enoxaparin, and dalteparin ?palifermin ?protamine ?This medicine may also interact with the following medications: ?dextran ?digoxin ?hydroxychloroquine ?medicines for treating colds or allergies ?nicotine ?NSAIDs, medicines for pain and inflammation, like ibuprofen or naproxen ?phenylbutazone ?tetracycline antibiotics ?This list may not describe all possible interactions. Give your health care provider a list of all the medicines, herbs, non-prescription drugs, or dietary supplements you use. Also tell them if you smoke, drink alcohol, or use illegal drugs. Some items may interact with your medicine. ?What should I watch for while using this medication? ?Visit your healthcare professional for regular checks on your progress. You may need blood work done while you are taking this medicine. Your condition will be monitored carefully while you are receiving this medicine. It is important not to miss any appointments. ?Wear a medical ID bracelet or chain, and carry a card that describes your disease and details   of your medicine and dosage times. ?Notify your doctor or healthcare professional at once if you have cold, blue hands or feet. ?If you are going to need surgery or other procedure, tell your healthcare  professional that you are using this medicine. ?Avoid sports and activities that might cause injury while you are using this medicine. Severe falls or injuries can cause unseen bleeding. Be careful when using sharp tools or knives. Consider using an Copy. Take special care brushing or flossing your teeth. Report any injuries, bruising, or red spots on the skin to your healthcare professional. ?Using this medicine for a long time may weaken your bones and increase the risk of bone fractures. ?You should make sure that you get enough calcium and vitamin D while you are taking this medicine. Discuss the foods you eat and the vitamins you take with your healthcare professional. ?Wear a medical ID bracelet or chain. Carry a card that describes your disease and details of your medicine and dosage times. ?What side effects may I notice from receiving this medication? ?Side effects that you should report to your doctor or health care professional as soon as possible: ?allergic reactions like skin rash, itching or hives, swelling of the face, lips, or tongue ?bone pain ?fever, chills ?nausea, vomiting ?signs and symptoms of bleeding such as bloody or black, tarry stools; red or dark-brown urine; spitting up blood or brown material that looks like coffee grounds; red spots on the skin; unusual bruising or bleeding from the eye, gums, or nose ?signs and symptoms of a blood clot such as chest pain; shortness of breath; pain, swelling, or warmth in the leg ?signs and symptoms of a stroke such as changes in vision; confusion; trouble speaking or understanding; severe headaches; sudden numbness or weakness of the face, arm or leg; trouble walking; dizziness; loss of coordination ?Side effects that usually do not require medical attention (report to your doctor or health care professional if they continue or are bothersome): ?hair loss ?pain, redness, or irritation at site where injected ?This list may not describe all  possible side effects. Call your doctor for medical advice about side effects. You may report side effects to FDA at 1-800-FDA-1088. ?Where should I keep my medication? ?Keep out of the reach of children. ?Store unopened vials at room temperature between 15 and 30 degrees C (59 and 86 degrees F). Do not freeze. Do not use if solution is discolored or particulate matter is present. Throw away any unused medicine after the expiration date. ?NOTE: This sheet is a summary. It may not cover all possible information. If you have questions about this medicine, talk to your doctor, pharmacist, or health care provider. ?? 2022 Elsevier/Gold Standard (2020-03-10 00:00:00) ? ?

## 2021-05-09 NOTE — Progress Notes (Signed)
Accessed Medtronic pump left upper abdomen using sterile technique without difficulty. Aspirated 3.2 ml (3.2 ml pump balance) of clear fluid (confirmed w/ipad). Instilled slowly aspirating every  3 ml for a total of 25,000 Units Heparin in 20 ml NS without difficulty. Patient tolerated procedure well. Confirmed next appointment is 06/07/2021. Discharged in good condition. ?

## 2021-06-05 ENCOUNTER — Inpatient Hospital Stay: Payer: Medicare Other | Attending: Oncology

## 2021-06-05 VITALS — BP 137/72 | HR 65 | Temp 98.3°F | Resp 18 | Ht 64.0 in | Wt 122.5 lb

## 2021-06-05 DIAGNOSIS — Z452 Encounter for adjustment and management of vascular access device: Secondary | ICD-10-CM | POA: Diagnosis present

## 2021-06-05 DIAGNOSIS — C221 Intrahepatic bile duct carcinoma: Secondary | ICD-10-CM | POA: Diagnosis present

## 2021-06-05 DIAGNOSIS — Z95828 Presence of other vascular implants and grafts: Secondary | ICD-10-CM

## 2021-06-05 MED ORDER — SODIUM CHLORIDE 0.9% FLUSH
10.0000 mL | Freq: Once | INTRAVENOUS | Status: AC
  Administered 2021-06-05: 10 mL

## 2021-06-05 MED ORDER — SODIUM CHLORIDE 0.9% FLUSH: 10.0000 mL | INTRAVENOUS | Status: DC | PRN

## 2021-06-05 MED ORDER — HEPARIN SOD (PORK) LOCK FLUSH 100 UNIT/ML IV SOLN
500.0000 [IU] | Freq: Once | INTRAVENOUS | Status: AC | PRN
  Administered 2021-06-05: 500 [IU]

## 2021-06-05 MED ORDER — SODIUM CHLORIDE (PF) 0.9 % IJ SOLN
Freq: Once | INTRAMUSCULAR | Status: AC
  Filled 2021-06-05: qty 5

## 2021-06-05 NOTE — Patient Instructions (Signed)
Heparin injection ?What is this medication? ?HEPARIN (HEP a rin) is an anticoagulant. It is used to treat or prevent clots in the veins, arteries, lungs, or heart. It stops clots from forming or getting bigger. This medicine prevents clotting during open-heart surgery, dialysis, or in patients who are confined to bed. ?This medicine may be used for other purposes; ask your health care provider or pharmacist if you have questions. ?COMMON BRAND NAME(S): Hep-Lock, Hep-Lock U/P, Hepflush-10, Monoject Prefill Advanced Heparin Lock Flush, SASH Normal Saline and Heparin ?What should I tell my care team before I take this medication? ?They need to know if you have any of these conditions: ?bleeding disorders, such as hemophilia or low blood platelets ?bowel disease or diverticulitis ?endocarditis ?high blood pressure ?liver disease ?recent surgery or delivery of a baby ?stomach ulcers ?an unusual or allergic reaction to heparin, benzyl alcohol, sulfites, other medicines, foods, dyes, or preservatives ?pregnant or trying to get pregnant ?breast-feeding ?How should I use this medication? ?This medicine is given by injection or infusion into a vein. It can also be given by injection of small amounts under the skin. It is usually given by a health care professional in a hospital or clinic setting. ?If you get this medicine at home, you will be taught how to prepare and give this medicine. Use exactly as directed. Take your medicine at regular intervals. Do not take it more often than directed. Do not stop taking except on your doctor's advice. Stopping this medicine may increase your risk of a blot clot. Be sure to refill your prescription before you run out of medicine. ?It is important that you put your used needles and syringes in a special sharps container. Do not put them in a trash can. If you do not have a sharps container, call your pharmacist or healthcare provider to get one. ?Talk to your pediatrician regarding the  use of this medicine in children. While this medicine may be prescribed for children for selected conditions, precautions do apply. ?Overdosage: If you think you have taken too much of this medicine contact a poison control center or emergency room at once. ?NOTE: This medicine is only for you. Do not share this medicine with others. ?What if I miss a dose? ?If you miss a dose, take it as soon as you can. If it is almost time for your next dose, take only that dose. Do not take double or extra doses. ?What may interact with this medication? ?Do not take this medicine with any of the following medications: ?aspirin and aspirin-like drugs ?mifepristone ?medicines that treat or prevent blood clots like warfarin, enoxaparin, and dalteparin ?palifermin ?protamine ?This medicine may also interact with the following medications: ?dextran ?digoxin ?hydroxychloroquine ?medicines for treating colds or allergies ?nicotine ?NSAIDs, medicines for pain and inflammation, like ibuprofen or naproxen ?phenylbutazone ?tetracycline antibiotics ?This list may not describe all possible interactions. Give your health care provider a list of all the medicines, herbs, non-prescription drugs, or dietary supplements you use. Also tell them if you smoke, drink alcohol, or use illegal drugs. Some items may interact with your medicine. ?What should I watch for while using this medication? ?Visit your healthcare professional for regular checks on your progress. You may need blood work done while you are taking this medicine. Your condition will be monitored carefully while you are receiving this medicine. It is important not to miss any appointments. ?Wear a medical ID bracelet or chain, and carry a card that describes your disease and details   of your medicine and dosage times. ?Notify your doctor or healthcare professional at once if you have cold, blue hands or feet. ?If you are going to need surgery or other procedure, tell your healthcare  professional that you are using this medicine. ?Avoid sports and activities that might cause injury while you are using this medicine. Severe falls or injuries can cause unseen bleeding. Be careful when using sharp tools or knives. Consider using an electric razor. Take special care brushing or flossing your teeth. Report any injuries, bruising, or red spots on the skin to your healthcare professional. ?Using this medicine for a long time may weaken your bones and increase the risk of bone fractures. ?You should make sure that you get enough calcium and vitamin D while you are taking this medicine. Discuss the foods you eat and the vitamins you take with your healthcare professional. ?Wear a medical ID bracelet or chain. Carry a card that describes your disease and details of your medicine and dosage times. ?What side effects may I notice from receiving this medication? ?Side effects that you should report to your doctor or health care professional as soon as possible: ?allergic reactions like skin rash, itching or hives, swelling of the face, lips, or tongue ?bone pain ?fever, chills ?nausea, vomiting ?signs and symptoms of bleeding such as bloody or black, tarry stools; red or dark-brown urine; spitting up blood or brown material that looks like coffee grounds; red spots on the skin; unusual bruising or bleeding from the eye, gums, or nose ?signs and symptoms of a blood clot such as chest pain; shortness of breath; pain, swelling, or warmth in the leg ?signs and symptoms of a stroke such as changes in vision; confusion; trouble speaking or understanding; severe headaches; sudden numbness or weakness of the face, arm or leg; trouble walking; dizziness; loss of coordination ?Side effects that usually do not require medical attention (report to your doctor or health care professional if they continue or are bothersome): ?hair loss ?pain, redness, or irritation at site where injected ?This list may not describe all  possible side effects. Call your doctor for medical advice about side effects. You may report side effects to FDA at 1-800-FDA-1088. ?Where should I keep my medication? ?Keep out of the reach of children. ?Store unopened vials at room temperature between 15 and 30 degrees C (59 and 86 degrees F). Do not freeze. Do not use if solution is discolored or particulate matter is present. Throw away any unused medicine after the expiration date. ?NOTE: This sheet is a summary. It may not cover all possible information. If you have questions about this medicine, talk to your doctor, pharmacist, or health care provider. ?? 2023 Elsevier/Gold Standard (2020-03-10 00:00:00) ? ?

## 2021-06-05 NOTE — Progress Notes (Signed)
Accessed Medtronic pump left upper abdomen using sterile technique without difficulty. Aspirated 3.5 ml (3.8 ml pump balance) of clear fluid (confirmed w/ipad). Instilled slowly aspirating every  3 ml for a total of 25,000 Units Heparin in 20 ml NS without difficulty. Patient tolerated procedure well. Confirmed next appointment is 07/04/2021. Discharged in good condition. ?

## 2021-06-06 ENCOUNTER — Ambulatory Visit: Payer: Medicare Other

## 2021-07-04 ENCOUNTER — Inpatient Hospital Stay: Payer: Medicare Other | Attending: Oncology

## 2021-07-04 ENCOUNTER — Encounter: Payer: Self-pay | Admitting: Nurse Practitioner

## 2021-07-04 ENCOUNTER — Inpatient Hospital Stay (HOSPITAL_BASED_OUTPATIENT_CLINIC_OR_DEPARTMENT_OTHER): Payer: Medicare Other | Admitting: Nurse Practitioner

## 2021-07-04 VITALS — BP 116/68 | HR 75 | Temp 98.4°F | Resp 18 | Wt 124.8 lb

## 2021-07-04 DIAGNOSIS — C221 Intrahepatic bile duct carcinoma: Secondary | ICD-10-CM | POA: Diagnosis present

## 2021-07-04 DIAGNOSIS — Z452 Encounter for adjustment and management of vascular access device: Secondary | ICD-10-CM | POA: Diagnosis present

## 2021-07-04 DIAGNOSIS — C73 Malignant neoplasm of thyroid gland: Secondary | ICD-10-CM | POA: Diagnosis not present

## 2021-07-04 DIAGNOSIS — Z95828 Presence of other vascular implants and grafts: Secondary | ICD-10-CM

## 2021-07-04 MED ORDER — SODIUM CHLORIDE (PF) 0.9 % IJ SOLN
Freq: Once | INTRAMUSCULAR | Status: AC
  Filled 2021-07-04: qty 5

## 2021-07-04 MED ORDER — SODIUM CHLORIDE 0.9% FLUSH: 10.0000 mL | INTRAVENOUS | Status: DC | PRN

## 2021-07-04 MED ORDER — HEPARIN SOD (PORK) LOCK FLUSH 100 UNIT/ML IV SOLN
500.0000 [IU] | Freq: Once | INTRAVENOUS | Status: AC | PRN
  Administered 2021-07-04: 500 [IU]

## 2021-07-04 MED ORDER — SODIUM CHLORIDE 0.9% FLUSH
10.0000 mL | Freq: Once | INTRAVENOUS | Status: AC
  Administered 2021-07-04: 10 mL

## 2021-07-04 NOTE — Patient Instructions (Signed)
Heparin injection ?What is this medication? ?HEPARIN (HEP a rin) is an anticoagulant. It is used to treat or prevent clots in the veins, arteries, lungs, or heart. It stops clots from forming or getting bigger. This medicine prevents clotting during open-heart surgery, dialysis, or in patients who are confined to bed. ?This medicine may be used for other purposes; ask your health care provider or pharmacist if you have questions. ?COMMON BRAND NAME(S): Hep-Lock, Hep-Lock U/P, Hepflush-10, Monoject Prefill Advanced Heparin Lock Flush, SASH Normal Saline and Heparin ?What should I tell my care team before I take this medication? ?They need to know if you have any of these conditions: ?bleeding disorders, such as hemophilia or low blood platelets ?bowel disease or diverticulitis ?endocarditis ?high blood pressure ?liver disease ?recent surgery or delivery of a baby ?stomach ulcers ?an unusual or allergic reaction to heparin, benzyl alcohol, sulfites, other medicines, foods, dyes, or preservatives ?pregnant or trying to get pregnant ?breast-feeding ?How should I use this medication? ?This medicine is given by injection or infusion into a vein. It can also be given by injection of small amounts under the skin. It is usually given by a health care professional in a hospital or clinic setting. ?If you get this medicine at home, you will be taught how to prepare and give this medicine. Use exactly as directed. Take your medicine at regular intervals. Do not take it more often than directed. Do not stop taking except on your doctor's advice. Stopping this medicine may increase your risk of a blot clot. Be sure to refill your prescription before you run out of medicine. ?It is important that you put your used needles and syringes in a special sharps container. Do not put them in a trash can. If you do not have a sharps container, call your pharmacist or healthcare provider to get one. ?Talk to your pediatrician regarding the  use of this medicine in children. While this medicine may be prescribed for children for selected conditions, precautions do apply. ?Overdosage: If you think you have taken too much of this medicine contact a poison control center or emergency room at once. ?NOTE: This medicine is only for you. Do not share this medicine with others. ?What if I miss a dose? ?If you miss a dose, take it as soon as you can. If it is almost time for your next dose, take only that dose. Do not take double or extra doses. ?What may interact with this medication? ?Do not take this medicine with any of the following medications: ?aspirin and aspirin-like drugs ?mifepristone ?medicines that treat or prevent blood clots like warfarin, enoxaparin, and dalteparin ?palifermin ?protamine ?This medicine may also interact with the following medications: ?dextran ?digoxin ?hydroxychloroquine ?medicines for treating colds or allergies ?nicotine ?NSAIDs, medicines for pain and inflammation, like ibuprofen or naproxen ?phenylbutazone ?tetracycline antibiotics ?This list may not describe all possible interactions. Give your health care provider a list of all the medicines, herbs, non-prescription drugs, or dietary supplements you use. Also tell them if you smoke, drink alcohol, or use illegal drugs. Some items may interact with your medicine. ?What should I watch for while using this medication? ?Visit your healthcare professional for regular checks on your progress. You may need blood work done while you are taking this medicine. Your condition will be monitored carefully while you are receiving this medicine. It is important not to miss any appointments. ?Wear a medical ID bracelet or chain, and carry a card that describes your disease and details   of your medicine and dosage times. ?Notify your doctor or healthcare professional at once if you have cold, blue hands or feet. ?If you are going to need surgery or other procedure, tell your healthcare  professional that you are using this medicine. ?Avoid sports and activities that might cause injury while you are using this medicine. Severe falls or injuries can cause unseen bleeding. Be careful when using sharp tools or knives. Consider using an electric razor. Take special care brushing or flossing your teeth. Report any injuries, bruising, or red spots on the skin to your healthcare professional. ?Using this medicine for a long time may weaken your bones and increase the risk of bone fractures. ?You should make sure that you get enough calcium and vitamin D while you are taking this medicine. Discuss the foods you eat and the vitamins you take with your healthcare professional. ?Wear a medical ID bracelet or chain. Carry a card that describes your disease and details of your medicine and dosage times. ?What side effects may I notice from receiving this medication? ?Side effects that you should report to your doctor or health care professional as soon as possible: ?allergic reactions like skin rash, itching or hives, swelling of the face, lips, or tongue ?bone pain ?fever, chills ?nausea, vomiting ?signs and symptoms of bleeding such as bloody or black, tarry stools; red or dark-brown urine; spitting up blood or brown material that looks like coffee grounds; red spots on the skin; unusual bruising or bleeding from the eye, gums, or nose ?signs and symptoms of a blood clot such as chest pain; shortness of breath; pain, swelling, or warmth in the leg ?signs and symptoms of a stroke such as changes in vision; confusion; trouble speaking or understanding; severe headaches; sudden numbness or weakness of the face, arm or leg; trouble walking; dizziness; loss of coordination ?Side effects that usually do not require medical attention (report to your doctor or health care professional if they continue or are bothersome): ?hair loss ?pain, redness, or irritation at site where injected ?This list may not describe all  possible side effects. Call your doctor for medical advice about side effects. You may report side effects to FDA at 1-800-FDA-1088. ?Where should I keep my medication? ?Keep out of the reach of children. ?Store unopened vials at room temperature between 15 and 30 degrees C (59 and 86 degrees F). Do not freeze. Do not use if solution is discolored or particulate matter is present. Throw away any unused medicine after the expiration date. ?NOTE: This sheet is a summary. It may not cover all possible information. If you have questions about this medicine, talk to your doctor, pharmacist, or health care provider. ?? 2023 Elsevier/Gold Standard (2020-03-10 00:00:00) ? ?

## 2021-07-04 NOTE — Progress Notes (Unsigned)
[place] OFFICE PROGRESS NOTE   Diagnosis: Cholangiocarcinoma  INTERVAL HISTORY:   [name] returns as scheduled.  Overall she feels well.  No nausea or vomiting.  No diarrhea or constipation.  No rash.  Occasional difficulty sleeping.  Main complaint is a "nagging pain" left neck intermittently for the past year.  She has noted tingling in the fingertips on a few occasions, unsure left or right.  She reports radioactive iodine is planned this summer.  Objective:  Vital signs in last 24 hours:  Blood pressure 116/68, pulse 75, temperature 98.4 F (36.9 C), temperature source Oral, resp. rate 18, weight 124 lb 12.8 oz (56.6 kg), SpO2 100 %.    Lymphatics: No palpable cervical or supraclavicular lymph nodes. Resp: Lungs clear bilaterally. Cardio: Regular rate and rhythm. GI: Abdomen soft and nontender.  No hepatomegaly.  Right abdomen hepatic infusion pump. Vascular: No leg edema. Neuro: Hand/arm strength intact.  Port-A-Cath without erythema.  Lab Results:  Lab Results  Component Value Date   WBC 5.0 10/07/2020   HGB 12.2 10/07/2020   HCT 37.9 10/07/2020   MCV 90.2 10/07/2020   PLT 185 10/07/2020   NEUTROABS 3.0 10/07/2020    Imaging:  No results found.  Medications: I have reviewed the patient's current medications.  Assessment/Plan: Intrahepatic cholangiocarcinoma Abdominal ultrasound 10/08/2018-heterogenous right hepatic mass MRI abdomen 10/14/2018-2 enhancing right liver lesions, 4.8 x 4.5 x 4.1 cm, more superior lesion measured 3 x 2 x 2.5 cm, multiple additional subcentimeter T2 hyperintense lesions-too small to characterize. CT chest 11/04/2018-6 mm left upper lobe groundglass nodule, no evidence of metastatic disease in the chest Biopsy of right liver lesion at [facility] 11/10/2018-moderately differentiated adenocarcinoma consistent with cholangiocarcinoma; IDH1 alteration; MSI stable; mismatch repair status proficient; tumor mutational  burden low, 3; PD-L1 negative CTs at [facility] 12/12/2018-right hepatic lobe masses, satellite lesions adjacent to the dominant mass, prominent periportal and aortocaval lymph nodes prominent left supraclavicular node Cycle 1 gemcitabine/cisplatin at [place] on 12/12/2018 Cycle 2 gemcitabine/cisplatin 01/02/2019, 01/09/2019 Cycle 3 gemcitabine/cisplatin 01/23/2019 CTs at [facility] 02/02/2019-decrease in size of multiple hepatic masses, decreased size of prominent periportal and aortocaval nodes, unchanged prominent left supraclavicular node Cycle 4 gemcitabine/cisplatin 02/11/2019, Udenyca added and gemcitabine dose reduced with day 8 chemotherapy Cycle 5 gemcitabine/cisplatin 03/04/2019, gemcitabine reescalated to full dose Cycle 6 gemcitabine/cisplatin 03/25/2019 Cycle 7 gemcitabine/cisplatin 04/15/2019 Cycle 8 gemcitabine/cisplatin 05/06/2019 CT 05/18/2019-decreased size of multiple right hepatic masses 06/17/2019-hepatic arterial infusion pump placed, resection of hepatic artery/portal nodes, 1/7 portal nodes positive 07/10/2019-FUDR No. 1 07/23/2019 gemcitabine 800 mg/m added 08/06/2019-FUDR No. 2, 55 mg (dose reduced due to mild increase in bilirubin (, gemcitabine 800 mg/m and oxaliplatin 68 mg/m 08/20/2019-treatment continued, Decadron and pump continue due to mild increase in alkaline phosphatase 09/17/2019, FUDR No. 3 at 28 mg (dose reduced due to continued increase in alkaline phosphatase) 10/01/2019, CTs with continued response to therapy, decrease in liver disease 10/01/2019, therapy continued 10/15/2019, therapy continue with gemcitabine and oxaliplatin, FUDR restarted at 55 mg 11/12/2019-alkaline phosphatase elevated, FUDR held, Decadron given in hepatic infusion flush 11/26/2019 chemotherapy held secondary to elevated liver enzymes 11/26/2019-CT with continued improvement in the hepatic tumor burden 12/10/2019-chemotherapy restarted with gemcitabine/oxaliplatin 12/24/2019 chemotherapy continued, FUDR 28  mg 02/04/2020-oxaliplatin held secondary to worsening neuropathy 03/02/2020-CTs-stable disease Treatment continued with FUDR and gemcitabine CTs 05/26/2020-unchanged treated lesions in the liver, unchanged left upper lobe pulmonary nodules, no evidence of disease progression 05/26/2020-recommendation to continue current therapy with gemcitabine plus FUDR (as labs allow) 06/09/2020-FUDR held 06/23/2020-FUDR  held, continue gemcitabine, steroids given in HIA flush 07/21/2020 every 2-week Gemcitabine CTs at [facility] 08/18/2020-stable treated hepatic metastases, no evidence of new or enlarging metastatic disease Every 2-week gemcitabine continued 08/31/2020 gemcitabine, Udenyca MRI 09/16/2020 at [facility]-irregularly shaped 1.9 cm lesion in hepatic segment 5 favored to represent posttreatment fibrosis.  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  Progressively increasing intrahepatic ductal dilatation over the past several exams. ERCP at [facility] 10/03/2020-single severe localized biliary stricture was found in the common hepatic duct.  The stricture was fibrotic.  The left and right hepatic ducts and all intrahepatic branches were moderately dilated.  The upper third of the main bile duct was successfully dilated.  Plastic biliary stent was placed into the common bile duct.  Brushings were obtained in the upper third of the main bile duct-no evidence of malignancy. PET CT at [facility] 10/20/2020-decreased intrahepatic duct dilation, previously described segment 5 lesion without increased FDG activity, hypermetabolic left thyroid lesion CTs at [facility] 12/26/2020-stable tiny bilateral pulmonary nodules, stable mild Intermatic biliary ductal dilatation, mild thickening of the common bile duct, stable subcentimeter low-attenuation liver lesion ERCP 01/12/2021-severe localized complex stenoses in the common hepatic duct, hepatic bifurcation, left main hepatic duct, and right main hepatic  duct.  A plastic stent was placed into the right hepatic duct CTs 03/29/2021-similar appearance of liver lesions and delayed enhancing mass at the tip of the right lobe of the liver.  Similar bilateral pulmonary nodules. CTs 06/28/2021-unchanged liver lesions, no new metastatic disease, stable lung nodules.  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 Family history of multiple cancers eluding breast, pancreatic, melanoma, appendiceal cancer, lung cancer Osteoporosis Neutropenia secondary to chemotherapy, Udenyca added with day 8 cycle 4 Hypermetabolic left thyroid nodule on PET 10/20/2020; thyroid nodule and left neck lymph node biopsy 12/12/2020-both diagnostic of malignancy, papillary thyroid carcinoma; thyroidectomy 03/14/2021 with pathology revealing papillary thyroid cancer with 1 of 8 nodes positive; radioactive iodine planned    Disposition: Angela Fleming appears stable.  Recent CT scans show stable disease.  Next scans planned at a 41-monthinterval.  We will continue to flush the Port-A-Cath and hepatic infusion pump every 4 weeks.  She is having intermittent left-sided neck pain.  We made a referral to physical therapy.  She will return for an office visit in 12 weeks.  We are available to see her sooner if needed.  Patient seen with Dr. SBenay Spice  LNed CardANP/GNP-BC   07/04/2021  12:47 PM  This was a shared visit with LNed Card  Ms. GEvolawas interviewed and examined.  She remains in clinical remission from the cholangiocarcinoma.  She will continue surveillance imaging with Dr. UFanny Skates The neck discomfort is most likely related to a benign musculoskeletal condition.  We made a referral to physical therapy.  I was present for greater than 50% of today's visit.  I performed medical  decision making.  BJulieanne Manson MD

## 2021-07-04 NOTE — Progress Notes (Signed)
Accessed Medtronic pump left upper abdomen using sterile technique without difficulty. Aspirated 3.0 ml (2.6 ml pump balance) of clear fluid (confirmed w/ipad). Instilled slowly aspirating every  3 ml for a total of 25,000 Units Heparin in 20 ml NS without difficulty. Patient tolerated procedure well. Confirmed next appointment is 08/01/2021. Discharged in good condition.

## 2021-07-05 ENCOUNTER — Encounter: Payer: Self-pay | Admitting: Oncology

## 2021-07-07 ENCOUNTER — Encounter: Payer: Self-pay | Admitting: *Deleted

## 2021-07-07 NOTE — Progress Notes (Signed)
Faxed referral order, demographics and chart information to [facility] PT--Angela Fleming. Not able to get fax to go through their fax. Was instructed to fax to [phone] and note on cover sheet to forward to [facility] office.

## 2021-07-13 ENCOUNTER — Other Ambulatory Visit (HOSPITAL_COMMUNITY): Payer: Self-pay | Admitting: Internal Medicine

## 2021-07-13 DIAGNOSIS — C73 Malignant neoplasm of thyroid gland: Secondary | ICD-10-CM

## 2021-07-13 NOTE — Written Directive (Addendum)
MOLECULAR IMAGING AND THERAPEUTICS WRITTEN DIRECTIVE   PATIENT NAME: Angela Fleming  PT DOB:   [birth year]                                              MRN: [id number]  ---------------------------------------------------------------------------------------------------------------------   I-131 THYROID CANCER THERAPY   RADIOPHARMACEUTICAL:  Iodine-131 Capsule    PRESCRIBED DOSE FOR ADMINISTRATION: 120 mCi    ROUTE OFADMINISTRATION:  PO   DIAGNOSIS: Thyroid Cancer   REFERRING PHYSICIAN:Kerr,Jeffrey MD   THYROGEN STIMULATION OR HORMONE WITHDRAW:Thyrogen   REMNANT ABLATION OR ADJUVANT THERAPY: Remnant ablation and adjuvant therapy   DATE OF THYROIDECTOMY:1//31/23   SURGEON:Randall,Paul MD   TSH:   Lab Results  Component Value Date   TSH 0.87 08/24/2010   TSH 0.79 03/16/2009     PRIOR I-131 THERAPY (Date and Dose): No  Pathology:  Cell type: '[x]'$   Papillary  '[]'$   Follicular  '[]'$   [place]   Largest tumor focus: 1.6     cm  Extrathyroidal Extension?     Yes '[]'$   No '[x]'$     Lymphovascular Invasion?  Yes  '[]'$   No  '[x]'$     Margins positive ? Yes '[]'$   No '[x]'$     Lymph nodes positive? Yes '[x]'$   No  '[]'$       # positive nodes:  1 # negative nodes:  7   TNM staging: pT: 1b        PN: 1b        Mx:    ADDITIONAL PHYSICIAN COMMENTS/NOTES: Intermediate risk disease in 66 yo female. Metastatic LN.  Remnant ablation and adjuvant therapy   AUTHORIZED USER SIGNATURE & TIME STAMP:

## 2021-07-17 ENCOUNTER — Encounter (HOSPITAL_COMMUNITY)
Admission: RE | Admit: 2021-07-17 | Discharge: 2021-07-17 | Disposition: A | Discharge status: Home / Self Care | Payer: Medicare Other | Source: Ambulatory Visit | Attending: Internal Medicine | Admitting: Internal Medicine

## 2021-07-18 ENCOUNTER — Encounter (HOSPITAL_COMMUNITY): Admission: RE | Admit: 2021-07-18 | Payer: Medicare Other | Source: Ambulatory Visit

## 2021-07-19 ENCOUNTER — Encounter (HOSPITAL_COMMUNITY): Payer: Medicare Other

## 2021-07-26 ENCOUNTER — Encounter (HOSPITAL_COMMUNITY): Payer: Medicare Other

## 2021-08-01 ENCOUNTER — Inpatient Hospital Stay: Payer: Medicare Other | Attending: Oncology

## 2021-08-01 VITALS — BP 107/63 | HR 62 | Temp 98.1°F | Resp 20 | Ht 64.0 in | Wt 120.4 lb

## 2021-08-01 DIAGNOSIS — Z452 Encounter for adjustment and management of vascular access device: Secondary | ICD-10-CM | POA: Insufficient documentation

## 2021-08-01 DIAGNOSIS — Z95828 Presence of other vascular implants and grafts: Secondary | ICD-10-CM

## 2021-08-01 DIAGNOSIS — C221 Intrahepatic bile duct carcinoma: Secondary | ICD-10-CM | POA: Diagnosis present

## 2021-08-01 MED ORDER — SODIUM CHLORIDE 0.9% FLUSH: 10.0000 mL | Freq: Once | INTRAVENOUS | Status: DC

## 2021-08-01 MED ORDER — SODIUM CHLORIDE (PF) 0.9 % IJ SOLN
Freq: Once | INTRAMUSCULAR | Status: AC
  Filled 2021-08-01: qty 5

## 2021-08-01 NOTE — Progress Notes (Signed)
Accessed Medtronic pump left upper abdomen using sterile technique without difficulty. Aspirated 3.2 ml (3.2 ml pump balance) of clear fluid (confirmed w/ipad). Instilled slowly aspirating every  3 ml for a total of 25,000 Units Heparin in 20 ml NS without difficulty. Patient tolerated procedure well. Confirmed next appointment is 08/30/2021. Discharged in good condition.

## 2021-08-01 NOTE — Patient Instructions (Signed)
Heparin injection ?What is this medication? ?HEPARIN (HEP a rin) is an anticoagulant. It is used to treat or prevent clots in the veins, arteries, lungs, or heart. It stops clots from forming or getting bigger. This medicine prevents clotting during open-heart surgery, dialysis, or in patients who are confined to bed. ?This medicine may be used for other purposes; ask your health care provider or pharmacist if you have questions. ?COMMON BRAND NAME(S): Hep-Lock, Hep-Lock U/P, Hepflush-10, Monoject Prefill Advanced Heparin Lock Flush, SASH Normal Saline and Heparin ?What should I tell my care team before I take this medication? ?They need to know if you have any of these conditions: ?bleeding disorders, such as hemophilia or low blood platelets ?bowel disease or diverticulitis ?endocarditis ?high blood pressure ?liver disease ?recent surgery or delivery of a baby ?stomach ulcers ?an unusual or allergic reaction to heparin, benzyl alcohol, sulfites, other medicines, foods, dyes, or preservatives ?pregnant or trying to get pregnant ?breast-feeding ?How should I use this medication? ?This medicine is given by injection or infusion into a vein. It can also be given by injection of small amounts under the skin. It is usually given by a health care professional in a hospital or clinic setting. ?If you get this medicine at home, you will be taught how to prepare and give this medicine. Use exactly as directed. Take your medicine at regular intervals. Do not take it more often than directed. Do not stop taking except on your doctor's advice. Stopping this medicine may increase your risk of a blot clot. Be sure to refill your prescription before you run out of medicine. ?It is important that you put your used needles and syringes in a special sharps container. Do not put them in a trash can. If you do not have a sharps container, call your pharmacist or healthcare provider to get one. ?Talk to your pediatrician regarding the  use of this medicine in children. While this medicine may be prescribed for children for selected conditions, precautions do apply. ?Overdosage: If you think you have taken too much of this medicine contact a poison control center or emergency room at once. ?NOTE: This medicine is only for you. Do not share this medicine with others. ?What if I miss a dose? ?If you miss a dose, take it as soon as you can. If it is almost time for your next dose, take only that dose. Do not take double or extra doses. ?What may interact with this medication? ?Do not take this medicine with any of the following medications: ?aspirin and aspirin-like drugs ?mifepristone ?medicines that treat or prevent blood clots like warfarin, enoxaparin, and dalteparin ?palifermin ?protamine ?This medicine may also interact with the following medications: ?dextran ?digoxin ?hydroxychloroquine ?medicines for treating colds or allergies ?nicotine ?NSAIDs, medicines for pain and inflammation, like ibuprofen or naproxen ?phenylbutazone ?tetracycline antibiotics ?This list may not describe all possible interactions. Give your health care provider a list of all the medicines, herbs, non-prescription drugs, or dietary supplements you use. Also tell them if you smoke, drink alcohol, or use illegal drugs. Some items may interact with your medicine. ?What should I watch for while using this medication? ?Visit your healthcare professional for regular checks on your progress. You may need blood work done while you are taking this medicine. Your condition will be monitored carefully while you are receiving this medicine. It is important not to miss any appointments. ?Wear a medical ID bracelet or chain, and carry a card that describes your disease and details   of your medicine and dosage times. ?Notify your doctor or healthcare professional at once if you have cold, blue hands or feet. ?If you are going to need surgery or other procedure, tell your healthcare  professional that you are using this medicine. ?Avoid sports and activities that might cause injury while you are using this medicine. Severe falls or injuries can cause unseen bleeding. Be careful when using sharp tools or knives. Consider using an electric razor. Take special care brushing or flossing your teeth. Report any injuries, bruising, or red spots on the skin to your healthcare professional. ?Using this medicine for a long time may weaken your bones and increase the risk of bone fractures. ?You should make sure that you get enough calcium and vitamin D while you are taking this medicine. Discuss the foods you eat and the vitamins you take with your healthcare professional. ?Wear a medical ID bracelet or chain. Carry a card that describes your disease and details of your medicine and dosage times. ?What side effects may I notice from receiving this medication? ?Side effects that you should report to your doctor or health care professional as soon as possible: ?allergic reactions like skin rash, itching or hives, swelling of the face, lips, or tongue ?bone pain ?fever, chills ?nausea, vomiting ?signs and symptoms of bleeding such as bloody or black, tarry stools; red or dark-brown urine; spitting up blood or brown material that looks like coffee grounds; red spots on the skin; unusual bruising or bleeding from the eye, gums, or nose ?signs and symptoms of a blood clot such as chest pain; shortness of breath; pain, swelling, or warmth in the leg ?signs and symptoms of a stroke such as changes in vision; confusion; trouble speaking or understanding; severe headaches; sudden numbness or weakness of the face, arm or leg; trouble walking; dizziness; loss of coordination ?Side effects that usually do not require medical attention (report to your doctor or health care professional if they continue or are bothersome): ?hair loss ?pain, redness, or irritation at site where injected ?This list may not describe all  possible side effects. Call your doctor for medical advice about side effects. You may report side effects to FDA at 1-800-FDA-1088. ?Where should I keep my medication? ?Keep out of the reach of children. ?Store unopened vials at room temperature between 15 and 30 degrees C (59 and 86 degrees F). Do not freeze. Do not use if solution is discolored or particulate matter is present. Throw away any unused medicine after the expiration date. ?NOTE: This sheet is a summary. It may not cover all possible information. If you have questions about this medicine, talk to your doctor, pharmacist, or health care provider. ?? 2023 Elsevier/Gold Standard (2020-03-10 00:00:00) ? ?

## 2021-08-25 NOTE — Written Directive (Addendum)
MOLECULAR IMAGING AND THERAPEUTICS WRITTEN DIRECTIVE   PATIENT NAME: Angela Fleming  PT DOB:   01/23/1956                                              MRN: [id number]  ---------------------------------------------------------------------------------------------------------------------   I-131 THYROID CANCER THERAPY   RADIOPHARMACEUTICAL:  Iodine-131 Capsule    PRESCRIBED DOSE FOR ADMINISTRATION: 120 mCi   ROUTE OFADMINISTRATION:  PO   DIAGNOSIS: Papillary Thyroid Cancer   REFERRING PHYSICIAN: Delrae Rend, M.D.   THYROGEN STIMULATION OR HORMONE WITHDRAW: Thyrogen Stimulation   REMNANT ABLATION OR ADJUVANT THERAPY:  Remnant Ablation   DATE OF THYROIDECTOMY:  03/14/2021   SURGEON:  Isla Pence M.D.   TSH:   Lab Results  Component Value Date   TSH 0.87 08/24/2010   TSH 0.79 03/16/2009     PRIOR I-131 THERAPY (Date and Dose):   Pathology:  Cell type: [x]  Papillary  []  Follicular  []  [place]   Largest tumor focus:1.6      cm  Extrathyroidal Extension?     Yes []  No [x]    Lymphovascular Invasion?  Yes  []  No  [x]    Margins positive ? Yes []  No [x]    Lymph nodes positive? Yes [x]  No  []      # positive nodes:  1 # negative nodes:     TNM staging: pT:   1b      PN:  1b       Mx:    ADDITIONAL PHYSICIAN COMMENTS/NOTES: 66 yo female with intermediate risk PTC (one positive cervical LN).   AUTHORIZED USER SIGNATURE & TIME STAMP: Rennis Golden, MD   10/04/21    8:53 AM

## 2021-08-29 ENCOUNTER — Inpatient Hospital Stay: Payer: Medicare Other | Attending: Oncology

## 2021-08-29 VITALS — BP 128/79 | HR 78 | Temp 98.5°F | Resp 18 | Ht 64.0 in | Wt 120.1 lb

## 2021-08-29 DIAGNOSIS — C221 Intrahepatic bile duct carcinoma: Secondary | ICD-10-CM | POA: Diagnosis present

## 2021-08-29 DIAGNOSIS — Z452 Encounter for adjustment and management of vascular access device: Secondary | ICD-10-CM | POA: Diagnosis present

## 2021-08-29 DIAGNOSIS — Z95828 Presence of other vascular implants and grafts: Secondary | ICD-10-CM

## 2021-08-29 MED ORDER — SODIUM CHLORIDE 0.9% FLUSH: 10.0000 mL | Freq: Once | INTRAVENOUS | Status: AC

## 2021-08-29 MED ORDER — SODIUM CHLORIDE (PF) 0.9 % IJ SOLN
Freq: Once | INTRAMUSCULAR | Status: AC
  Filled 2021-08-29: qty 5

## 2021-08-29 MED ORDER — HEPARIN SOD (PORK) LOCK FLUSH 100 UNIT/ML IV SOLN
500.0000 [IU] | Freq: Once | INTRAVENOUS | Status: AC | PRN
  Administered 2021-08-29: 500 [IU]

## 2021-08-29 MED ORDER — SODIUM CHLORIDE 0.9% FLUSH
10.0000 mL | INTRAVENOUS | Status: DC | PRN
  Administered 2021-08-29: 10 mL

## 2021-08-29 NOTE — Patient Instructions (Signed)
Heparin injection ?What is this medication? ?HEPARIN (HEP a rin) is an anticoagulant. It is used to treat or prevent clots in the veins, arteries, lungs, or heart. It stops clots from forming or getting bigger. This medicine prevents clotting during open-heart surgery, dialysis, or in patients who are confined to bed. ?This medicine may be used for other purposes; ask your health care provider or pharmacist if you have questions. ?COMMON BRAND NAME(S): Hep-Lock, Hep-Lock U/P, Hepflush-10, Monoject Prefill Advanced Heparin Lock Flush, SASH Normal Saline and Heparin ?What should I tell my care team before I take this medication? ?They need to know if you have any of these conditions: ?bleeding disorders, such as hemophilia or low blood platelets ?bowel disease or diverticulitis ?endocarditis ?high blood pressure ?liver disease ?recent surgery or delivery of a baby ?stomach ulcers ?an unusual or allergic reaction to heparin, benzyl alcohol, sulfites, other medicines, foods, dyes, or preservatives ?pregnant or trying to get pregnant ?breast-feeding ?How should I use this medication? ?This medicine is given by injection or infusion into a vein. It can also be given by injection of small amounts under the skin. It is usually given by a health care professional in a hospital or clinic setting. ?If you get this medicine at home, you will be taught how to prepare and give this medicine. Use exactly as directed. Take your medicine at regular intervals. Do not take it more often than directed. Do not stop taking except on your doctor's advice. Stopping this medicine may increase your risk of a blot clot. Be sure to refill your prescription before you run out of medicine. ?It is important that you put your used needles and syringes in a special sharps container. Do not put them in a trash can. If you do not have a sharps container, call your pharmacist or healthcare provider to get one. ?Talk to your pediatrician regarding the  use of this medicine in children. While this medicine may be prescribed for children for selected conditions, precautions do apply. ?Overdosage: If you think you have taken too much of this medicine contact a poison control center or emergency room at once. ?NOTE: This medicine is only for you. Do not share this medicine with others. ?What if I miss a dose? ?If you miss a dose, take it as soon as you can. If it is almost time for your next dose, take only that dose. Do not take double or extra doses. ?What may interact with this medication? ?Do not take this medicine with any of the following medications: ?aspirin and aspirin-like drugs ?mifepristone ?medicines that treat or prevent blood clots like warfarin, enoxaparin, and dalteparin ?palifermin ?protamine ?This medicine may also interact with the following medications: ?dextran ?digoxin ?hydroxychloroquine ?medicines for treating colds or allergies ?nicotine ?NSAIDs, medicines for pain and inflammation, like ibuprofen or naproxen ?phenylbutazone ?tetracycline antibiotics ?This list may not describe all possible interactions. Give your health care provider a list of all the medicines, herbs, non-prescription drugs, or dietary supplements you use. Also tell them if you smoke, drink alcohol, or use illegal drugs. Some items may interact with your medicine. ?What should I watch for while using this medication? ?Visit your healthcare professional for regular checks on your progress. You may need blood work done while you are taking this medicine. Your condition will be monitored carefully while you are receiving this medicine. It is important not to miss any appointments. ?Wear a medical ID bracelet or chain, and carry a card that describes your disease and details   of your medicine and dosage times. ?Notify your doctor or healthcare professional at once if you have cold, blue hands or feet. ?If you are going to need surgery or other procedure, tell your healthcare  professional that you are using this medicine. ?Avoid sports and activities that might cause injury while you are using this medicine. Severe falls or injuries can cause unseen bleeding. Be careful when using sharp tools or knives. Consider using an electric razor. Take special care brushing or flossing your teeth. Report any injuries, bruising, or red spots on the skin to your healthcare professional. ?Using this medicine for a long time may weaken your bones and increase the risk of bone fractures. ?You should make sure that you get enough calcium and vitamin D while you are taking this medicine. Discuss the foods you eat and the vitamins you take with your healthcare professional. ?Wear a medical ID bracelet or chain. Carry a card that describes your disease and details of your medicine and dosage times. ?What side effects may I notice from receiving this medication? ?Side effects that you should report to your doctor or health care professional as soon as possible: ?allergic reactions like skin rash, itching or hives, swelling of the face, lips, or tongue ?bone pain ?fever, chills ?nausea, vomiting ?signs and symptoms of bleeding such as bloody or black, tarry stools; red or dark-brown urine; spitting up blood or brown material that looks like coffee grounds; red spots on the skin; unusual bruising or bleeding from the eye, gums, or nose ?signs and symptoms of a blood clot such as chest pain; shortness of breath; pain, swelling, or warmth in the leg ?signs and symptoms of a stroke such as changes in vision; confusion; trouble speaking or understanding; severe headaches; sudden numbness or weakness of the face, arm or leg; trouble walking; dizziness; loss of coordination ?Side effects that usually do not require medical attention (report to your doctor or health care professional if they continue or are bothersome): ?hair loss ?pain, redness, or irritation at site where injected ?This list may not describe all  possible side effects. Call your doctor for medical advice about side effects. You may report side effects to FDA at 1-800-FDA-1088. ?Where should I keep my medication? ?Keep out of the reach of children. ?Store unopened vials at room temperature between 15 and 30 degrees C (59 and 86 degrees F). Do not freeze. Do not use if solution is discolored or particulate matter is present. Throw away any unused medicine after the expiration date. ?NOTE: This sheet is a summary. It may not cover all possible information. If you have questions about this medicine, talk to your doctor, pharmacist, or health care provider. ?? 2023 Elsevier/Gold Standard (2020-03-10 00:00:00) ? ?

## 2021-09-04 ENCOUNTER — Other Ambulatory Visit: Payer: Self-pay

## 2021-09-12 ENCOUNTER — Other Ambulatory Visit: Payer: Self-pay

## 2021-09-26 ENCOUNTER — Inpatient Hospital Stay: Payer: Medicare Other

## 2021-09-26 ENCOUNTER — Inpatient Hospital Stay: Payer: Medicare Other | Attending: Oncology | Admitting: Oncology

## 2021-09-26 VITALS — BP 115/63 | HR 82 | Temp 98.2°F | Resp 18 | Ht 64.0 in | Wt 125.0 lb

## 2021-09-26 DIAGNOSIS — C221 Intrahepatic bile duct carcinoma: Secondary | ICD-10-CM | POA: Diagnosis present

## 2021-09-26 DIAGNOSIS — Z95828 Presence of other vascular implants and grafts: Secondary | ICD-10-CM

## 2021-09-26 DIAGNOSIS — Z452 Encounter for adjustment and management of vascular access device: Secondary | ICD-10-CM | POA: Diagnosis present

## 2021-09-26 DIAGNOSIS — Z85850 Personal history of malignant neoplasm of thyroid: Secondary | ICD-10-CM | POA: Diagnosis not present

## 2021-09-26 MED ORDER — SODIUM CHLORIDE (PF) 0.9 % IJ SOLN
Freq: Once | INTRAMUSCULAR | Status: AC
  Filled 2021-09-26: qty 5

## 2021-09-26 NOTE — Progress Notes (Signed)
Accessed Medtronic pump left upper abdomen using sterile technique without difficulty. Aspirated 4 ml (3.2 ml pump balance) of clear fluid (confirmed w/ipad). Instilled slowly aspirating every  3 ml for a total of 25,000 Units Heparin in 20 ml NS without difficulty. Patient tolerated procedure well. Confirmed next appointment is 10/25/2021. Discharged in good condition.

## 2021-09-26 NOTE — Progress Notes (Signed)
[facility] OFFICE PROGRESS NOTE   Diagnosis: Cholangiocarcinoma, thyroid cancer  INTERVAL HISTORY:   Angela Fleming returns as scheduled.  She generally feels well.  Good appetite.  She reports developing "hives "at the neck and over the trunk for the past few months.  She thinks this may be related to her son's dog.  The hives have improved since the dog moved out of the house last month. She underwent an ERCP and replacement of the left and right hepatic duct stents on 07/27/2021.  She reports developing severe nausea and vomiting on the day of the procedure and nausea persisted for several days.  She is scheduled for another ERCP next month.  She is scheduled for radioactive iodine therapy next week.  She will see Dr. Fanny Skates for restaging CTs later this week.  Objective:  Vital signs in last 24 hours:  Blood pressure 115/63, pulse 82, temperature 98.2 F (36.8 C), temperature source Oral, resp. rate 18, height _0  (1.626 m), weight 125 lb (56.7 kg), SpO2 100 %.    Lymphatics: No cervical, supraclavicular, axillary, or inguinal nodes Resp: Lungs clear bilaterally Cardio: Rate and rhythm GI: No hepatosplenomegaly, no mass, left abdomen infusion pump Vascular: No leg edema  Skin: Single hive at the right upper abdomen  Portacath/PICC-without erythema  Lab Results:  Lab Results  Component Value Date   WBC 5.0 10/07/2020   HGB 12.2 10/07/2020   HCT 37.9 10/07/2020   MCV 90.2 10/07/2020   PLT 185 10/07/2020   NEUTROABS 3.0 10/07/2020    CMP  Lab Results  Component Value Date   NA 139 01/19/2021   K 3.8 01/19/2021   CL 106 01/19/2021   CO2 26 01/19/2021   GLUCOSE 99 01/19/2021   BUN 12 01/19/2021   CREATININE 0.59 01/19/2021   CALCIUM 9.5 01/19/2021   PROT 6.6 01/19/2021   ALBUMIN 4.4 01/19/2021   AST 23 01/19/2021   ALT 78 (H) 01/19/2021   ALKPHOS 204 (H) 01/19/2021   BILITOT 0.6 01/19/2021   GFRNONAA >60 01/19/2021   GFRAA >60 05/13/2019     Lab Results  Component Value Date   CEA1 1.02 01/02/2019   YSA630 5 01/02/2019   Medications: I have reviewed the patient's current medications.   Assessment/Plan: Intrahepatic cholangiocarcinoma Abdominal ultrasound 10/08/2018-heterogenous right hepatic mass MRI abdomen 10/14/2018-2 enhancing right liver lesions, 4.8 x 4.5 x 4.1 cm, more superior lesion measured 3 x 2 x 2.5 cm, multiple additional subcentimeter T2 hyperintense lesions-too small to characterize. CT chest 11/04/2018-6 mm left upper lobe groundglass nodule, no evidence of metastatic disease in the chest Biopsy of right liver lesion at [facility] 11/10/2018-moderately differentiated adenocarcinoma consistent with cholangiocarcinoma; IDH1 alteration; MSI stable; mismatch repair status proficient; tumor mutational burden low, 3; PD-L1 negative CTs at [facility] 12/12/2018-right hepatic lobe masses, satellite lesions adjacent to the dominant mass, prominent periportal and aortocaval lymph nodes prominent left supraclavicular node Cycle 1 gemcitabine/cisplatin at [facility] on 12/12/2018 Cycle 2 gemcitabine/cisplatin 01/02/2019, 01/09/2019 Cycle 3 gemcitabine/cisplatin 01/23/2019 CTs at [facility] 02/02/2019-decrease in size of multiple hepatic masses, decreased size of prominent periportal and aortocaval nodes, unchanged prominent left supraclavicular node Cycle 4 gemcitabine/cisplatin 02/11/2019, Udenyca added and gemcitabine dose reduced with day 8 chemotherapy Cycle 5 gemcitabine/cisplatin 03/04/2019, gemcitabine reescalated to full dose Cycle 6 gemcitabine/cisplatin 03/25/2019 Cycle 7 gemcitabine/cisplatin 04/15/2019 Cycle 8 gemcitabine/cisplatin 05/06/2019 CT 05/18/2019-decreased size of multiple right hepatic masses 06/17/2019-hepatic arterial infusion pump placed, resection of hepatic artery/portal nodes, 1/7 portal nodes positive 07/10/2019-FUDR No. 1 07/23/2019  gemcitabine 800 mg/m added 08/06/2019-FUDR No. 2, 55 mg (dose reduced due to mild  increase in bilirubin (, gemcitabine 800 mg/m and oxaliplatin 68 mg/m 08/20/2019-treatment continued, Decadron and pump continue due to mild increase in alkaline phosphatase 09/17/2019, FUDR No. 3 at 28 mg (dose reduced due to continued increase in alkaline phosphatase) 10/01/2019, CTs with continued response to therapy, decrease in liver disease 10/01/2019, therapy continued 10/15/2019, therapy continue with gemcitabine and oxaliplatin, FUDR restarted at 55 mg 11/12/2019-alkaline phosphatase elevated, FUDR held, Decadron given in hepatic infusion flush 11/26/2019 chemotherapy held secondary to elevated liver enzymes 11/26/2019-CT with continued improvement in the hepatic tumor burden 12/10/2019-chemotherapy restarted with gemcitabine/oxaliplatin 12/24/2019 chemotherapy continued, FUDR 28 mg 02/04/2020-oxaliplatin held secondary to worsening neuropathy 03/02/2020-CTs-stable disease Treatment continued with FUDR and gemcitabine CTs 05/26/2020-unchanged treated lesions in the liver, unchanged left upper lobe pulmonary nodules, no evidence of disease progression 05/26/2020-recommendation to continue current therapy with gemcitabine plus FUDR (as labs allow) 06/09/2020-FUDR held 06/23/2020-FUDR held, continue gemcitabine, steroids given in HIA flush 07/21/2020 every 2-week Gemcitabine CTs at [facility] 08/18/2020-stable treated hepatic metastases, no evidence of new or enlarging metastatic disease Every 2-week gemcitabine continued 08/31/2020 gemcitabine, Udenyca MRI 09/16/2020 at [facility]-irregularly shaped 1.9 cm lesion in hepatic segment 5 favored to represent posttreatment fibrosis.  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  Progressively increasing intrahepatic ductal dilatation over the past several exams. ERCP at [facility] 10/03/2020-single severe localized biliary stricture was found in the common hepatic duct.  The stricture was fibrotic.  The left and right  hepatic ducts and all intrahepatic branches were moderately dilated.  The upper third of the main bile duct was successfully dilated.  Plastic biliary stent was placed into the common bile duct.  Brushings were obtained in the upper third of the main bile duct-no evidence of malignancy. PET CT at [facility] 10/20/2020-decreased intrahepatic duct dilation, previously described segment 5 lesion without increased FDG activity, hypermetabolic left thyroid lesion CTs at [facility] 12/26/2020-stable tiny bilateral pulmonary nodules, stable mild Intermatic biliary ductal dilatation, mild thickening of the common bile duct, stable subcentimeter low-attenuation liver lesion ERCP 01/12/2021-severe localized complex stenoses in the common hepatic duct, hepatic bifurcation, left main hepatic duct, and right main hepatic duct.  A plastic stent was placed into the right hepatic duct CTs 03/29/2021-similar appearance of liver lesions and delayed enhancing mass at the tip of the right lobe of the liver.  Similar bilateral pulmonary nodules. CTs 06/28/2021-unchanged liver lesions, no new metastatic disease, stable lung nodules.  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 ERCP with right and left hepatic stent exchange 07/27/2021 Family history of multiple cancers eluding breast, pancreatic, melanoma, appendiceal cancer, lung cancer Osteoporosis Neutropenia secondary to chemotherapy, Udenyca added with day 8 cycle 4 Hypermetabolic left thyroid nodule on PET 10/20/2020; thyroid nodule and left neck lymph node biopsy 12/12/2020-both diagnostic of malignancy, papillary thyroid carcinoma; thyroidectomy 03/14/2021 with pathology revealing papillary thyroid cancer with 1 of 8 nodes positive; radioactive iodine scheduled for week of 10/02/2021       Disposition: [name] appears unchanged.  There is no clinical evidence for progression of the cholangiocarcinoma.  She is scheduled to go undergo a restaging evaluation later this week.  She will continue ERCP/bile duct stent exchange at [facility].  She will discuss the postprocedure nausea  and vomiting with the [facility] team.  I suspect the nausea is related to the anesthesia or procedure itself.  She will contact us if she has significant following the next procedure.  We can bring her in here for antiemetics and IV fluids as needed.  She will complete treatment for thyroid cancer with radioactive iodine.  We flushed the abdominal infusion catheter today.  She will return for a catheter flush in 1 month and an office visit/catheter flush in 2 months.  I doubt the "hives "are related to the cholangiocarcinoma, but this is possible.  [name], MD  09/26/2021  1:27 PM

## 2021-09-26 NOTE — Patient Instructions (Signed)
Heparin injection What is this medication? HEPARIN (HEP a rin) is an anticoagulant. It is used to treat or prevent clots in the veins, arteries, lungs, or heart. It stops clots from forming or getting bigger. This medicine prevents clotting during open-heart surgery, dialysis, or in patients who are confined to bed. This medicine may be used for other purposes; ask your health care provider or pharmacist if you have questions. COMMON BRAND NAME(S): Hep-Lock, Hep-Lock U/P, Hepflush-10, Monoject Prefill Advanced Heparin Lock Flush, SASH Normal Saline and Heparin What should I tell my care team before I take this medication? They need to know if you have any of these conditions: bleeding disorders, such as hemophilia or low blood platelets bowel disease or diverticulitis endocarditis high blood pressure liver disease recent surgery or delivery of a baby stomach ulcers an unusual or allergic reaction to heparin, benzyl alcohol, sulfites, other medicines, foods, dyes, or preservatives pregnant or trying to get pregnant breast-feeding How should I use this medication? This medicine is given by injection or infusion into a vein. It can also be given by injection of small amounts under the skin. It is usually given by a health care professional in a hospital or clinic setting. If you get this medicine at home, you will be taught how to prepare and give this medicine. Use exactly as directed. Take your medicine at regular intervals. Do not take it more often than directed. Do not stop taking except on your doctor's advice. Stopping this medicine may increase your risk of a blot clot. Be sure to refill your prescription before you run out of medicine. It is important that you put your used needles and syringes in a special sharps container. Do not put them in a trash can. If you do not have a sharps container, call your pharmacist or healthcare provider to get one. Talk to your pediatrician regarding the  use of this medicine in children. While this medicine may be prescribed for children for selected conditions, precautions do apply. Overdosage: If you think you have taken too much of this medicine contact a poison control center or emergency room at once. NOTE: This medicine is only for you. Do not share this medicine with others. What if I miss a dose? If you miss a dose, take it as soon as you can. If it is almost time for your next dose, take only that dose. Do not take double or extra doses. What may interact with this medication? Do not take this medicine with any of the following medications: aspirin and aspirin-like drugs mifepristone medicines that treat or prevent blood clots like warfarin, enoxaparin, and dalteparin palifermin protamine This medicine may also interact with the following medications: dextran digoxin hydroxychloroquine medicines for treating colds or allergies nicotine NSAIDs, medicines for pain and inflammation, like ibuprofen or naproxen phenylbutazone tetracycline antibiotics This list may not describe all possible interactions. Give your health care provider a list of all the medicines, herbs, non-prescription drugs, or dietary supplements you use. Also tell them if you smoke, drink alcohol, or use illegal drugs. Some items may interact with your medicine. What should I watch for while using this medication? Visit your healthcare professional for regular checks on your progress. You may need blood work done while you are taking this medicine. Your condition will be monitored carefully while you are receiving this medicine. It is important not to miss any appointments. Wear a medical ID bracelet or chain, and carry a card that describes your disease and details   of your medicine and dosage times. Notify your doctor or healthcare professional at once if you have cold, blue hands or feet. If you are going to need surgery or other procedure, tell your healthcare  professional that you are using this medicine. Avoid sports and activities that might cause injury while you are using this medicine. Severe falls or injuries can cause unseen bleeding. Be careful when using sharp tools or knives. Consider using an electric razor. Take special care brushing or flossing your teeth. Report any injuries, bruising, or red spots on the skin to your healthcare professional. Using this medicine for a long time may weaken your bones and increase the risk of bone fractures. You should make sure that you get enough calcium and vitamin D while you are taking this medicine. Discuss the foods you eat and the vitamins you take with your healthcare professional. Wear a medical ID bracelet or chain. Carry a card that describes your disease and details of your medicine and dosage times. What side effects may I notice from receiving this medication? Side effects that you should report to your doctor or health care professional as soon as possible: allergic reactions like skin rash, itching or hives, swelling of the face, lips, or tongue bone pain fever, chills nausea, vomiting signs and symptoms of bleeding such as bloody or black, tarry stools; red or dark-brown urine; spitting up blood or brown material that looks like coffee grounds; red spots on the skin; unusual bruising or bleeding from the eye, gums, or nose signs and symptoms of a blood clot such as chest pain; shortness of breath; pain, swelling, or warmth in the leg signs and symptoms of a stroke such as changes in vision; confusion; trouble speaking or understanding; severe headaches; sudden numbness or weakness of the face, arm or leg; trouble walking; dizziness; loss of coordination Side effects that usually do not require medical attention (report to your doctor or health care professional if they continue or are bothersome): hair loss pain, redness, or irritation at site where injected This list may not describe all  possible side effects. Call your doctor for medical advice about side effects. You may report side effects to FDA at 1-800-FDA-1088. Where should I keep my medication? Keep out of the reach of children. Store unopened vials at room temperature between 15 and 30 degrees C (59 and 86 degrees F). Do not freeze. Do not use if solution is discolored or particulate matter is present. Throw away any unused medicine after the expiration date. NOTE: This sheet is a summary. It may not cover all possible information. If you have questions about this medicine, talk to your doctor, pharmacist, or health care provider.  2023 Elsevier/Gold Standard (2004-11-06 00:00:00)  

## 2021-09-27 ENCOUNTER — Encounter: Payer: Self-pay | Admitting: Oncology

## 2021-09-27 ENCOUNTER — Other Ambulatory Visit: Payer: Self-pay

## 2021-09-30 ENCOUNTER — Encounter: Payer: Self-pay | Admitting: Oncology

## 2021-10-02 ENCOUNTER — Encounter (HOSPITAL_COMMUNITY): Payer: Medicare Other

## 2021-10-03 ENCOUNTER — Other Ambulatory Visit: Payer: Self-pay

## 2021-10-03 ENCOUNTER — Encounter (HOSPITAL_COMMUNITY): Payer: Medicare Other

## 2021-10-03 ENCOUNTER — Ambulatory Visit (HOSPITAL_COMMUNITY): Payer: Medicare Other

## 2021-10-03 DIAGNOSIS — C221 Intrahepatic bile duct carcinoma: Secondary | ICD-10-CM

## 2021-10-04 ENCOUNTER — Encounter (HOSPITAL_COMMUNITY)
Admission: RE | Admit: 2021-10-04 | Discharge: 2021-10-04 | Disposition: A | Discharge status: Home / Self Care | Payer: Medicare Other | Source: Ambulatory Visit | Attending: Internal Medicine | Admitting: Internal Medicine

## 2021-10-04 ENCOUNTER — Ambulatory Visit (HOSPITAL_COMMUNITY): Payer: Medicare Other

## 2021-10-12 ENCOUNTER — Inpatient Hospital Stay: Payer: Medicare Other

## 2021-10-12 DIAGNOSIS — C221 Intrahepatic bile duct carcinoma: Secondary | ICD-10-CM | POA: Diagnosis not present

## 2021-10-12 LAB — CMP (CANCER CENTER ONLY)
ALT: 26 U/L (ref 0–44)
AST: 23 U/L (ref 15–41)
Albumin: 4.5 g/dL (ref 3.5–5.0)
Alkaline Phosphatase: 205 U/L — ABNORMAL HIGH (ref 38–126)
Anion gap: 7 (ref 5–15)
BUN: 14 mg/dL (ref 8–23)
CO2: 27 mmol/L (ref 22–32)
Calcium: 9.3 mg/dL (ref 8.9–10.3)
Chloride: 101 mmol/L (ref 98–111)
Creatinine: 0.69 mg/dL (ref 0.44–1.00)
GFR, Estimated: >60 mL/min (ref >60)
Glucose, Bld: 98 mg/dL (ref 70–99)
Potassium: 4 mmol/L (ref 3.5–5.1)
Sodium: 135 mmol/L (ref 135–145)
Total Bilirubin: 0.7 mg/dL (ref 0.3–1.2)
Total Protein: 7 g/dL (ref 6.5–8.1)

## 2021-10-13 ENCOUNTER — Other Ambulatory Visit (HOSPITAL_COMMUNITY): Payer: Medicare Other

## 2021-10-24 ENCOUNTER — Inpatient Hospital Stay: Payer: Medicare Other

## 2021-10-25 ENCOUNTER — Inpatient Hospital Stay: Payer: Medicare Other | Attending: Oncology

## 2021-10-25 VITALS — BP 129/73 | HR 79 | Temp 98.3°F | Resp 20 | Ht 64.0 in | Wt 125.2 lb

## 2021-10-25 DIAGNOSIS — Z95828 Presence of other vascular implants and grafts: Secondary | ICD-10-CM

## 2021-10-25 DIAGNOSIS — Z452 Encounter for adjustment and management of vascular access device: Secondary | ICD-10-CM | POA: Diagnosis present

## 2021-10-25 DIAGNOSIS — C221 Intrahepatic bile duct carcinoma: Secondary | ICD-10-CM | POA: Insufficient documentation

## 2021-10-25 MED ORDER — SODIUM CHLORIDE (PF) 0.9 % IJ SOLN
Freq: Once | INTRAMUSCULAR | Status: AC
  Filled 2021-10-25: qty 5

## 2021-10-25 MED ORDER — SODIUM CHLORIDE 0.9% FLUSH: 10.0000 mL | Freq: Once | INTRAVENOUS | Status: DC

## 2021-10-25 NOTE — Patient Instructions (Signed)
Heparin injection What is this medication? HEPARIN (HEP a rin) is an anticoagulant. It is used to treat or prevent clots in the veins, arteries, lungs, or heart. It stops clots from forming or getting bigger. This medicine prevents clotting during open-heart surgery, dialysis, or in patients who are confined to bed. This medicine may be used for other purposes; ask your health care provider or pharmacist if you have questions. COMMON BRAND NAME(S): Hep-Lock, Hep-Lock U/P, Hepflush-10, Monoject Prefill Advanced Heparin Lock Flush, SASH Normal Saline and Heparin What should I tell my care team before I take this medication? They need to know if you have any of these conditions: bleeding disorders, such as hemophilia or low blood platelets bowel disease or diverticulitis endocarditis high blood pressure liver disease recent surgery or delivery of a baby stomach ulcers an unusual or allergic reaction to heparin, benzyl alcohol, sulfites, other medicines, foods, dyes, or preservatives pregnant or trying to get pregnant breast-feeding How should I use this medication? This medicine is given by injection or infusion into a vein. It can also be given by injection of small amounts under the skin. It is usually given by a health care professional in a hospital or clinic setting. If you get this medicine at home, you will be taught how to prepare and give this medicine. Use exactly as directed. Take your medicine at regular intervals. Do not take it more often than directed. Do not stop taking except on your doctor's advice. Stopping this medicine may increase your risk of a blot clot. Be sure to refill your prescription before you run out of medicine. It is important that you put your used needles and syringes in a special sharps container. Do not put them in a trash can. If you do not have a sharps container, call your pharmacist or healthcare provider to get one. Talk to your pediatrician regarding the  use of this medicine in children. While this medicine may be prescribed for children for selected conditions, precautions do apply. Overdosage: If you think you have taken too much of this medicine contact a poison control center or emergency room at once. NOTE: This medicine is only for you. Do not share this medicine with others. What if I miss a dose? If you miss a dose, take it as soon as you can. If it is almost time for your next dose, take only that dose. Do not take double or extra doses. What may interact with this medication? Do not take this medicine with any of the following medications: aspirin and aspirin-like drugs mifepristone medicines that treat or prevent blood clots like warfarin, enoxaparin, and dalteparin palifermin protamine This medicine may also interact with the following medications: dextran digoxin hydroxychloroquine medicines for treating colds or allergies nicotine NSAIDs, medicines for pain and inflammation, like ibuprofen or naproxen phenylbutazone tetracycline antibiotics This list may not describe all possible interactions. Give your health care provider a list of all the medicines, herbs, non-prescription drugs, or dietary supplements you use. Also tell them if you smoke, drink alcohol, or use illegal drugs. Some items may interact with your medicine. What should I watch for while using this medication? Visit your healthcare professional for regular checks on your progress. You may need blood work done while you are taking this medicine. Your condition will be monitored carefully while you are receiving this medicine. It is important not to miss any appointments. Wear a medical ID bracelet or chain, and carry a card that describes your disease and details   of your medicine and dosage times. Notify your doctor or healthcare professional at once if you have cold, blue hands or feet. If you are going to need surgery or other procedure, tell your healthcare  professional that you are using this medicine. Avoid sports and activities that might cause injury while you are using this medicine. Severe falls or injuries can cause unseen bleeding. Be careful when using sharp tools or knives. Consider using an electric razor. Take special care brushing or flossing your teeth. Report any injuries, bruising, or red spots on the skin to your healthcare professional. Using this medicine for a long time may weaken your bones and increase the risk of bone fractures. You should make sure that you get enough calcium and vitamin D while you are taking this medicine. Discuss the foods you eat and the vitamins you take with your healthcare professional. Wear a medical ID bracelet or chain. Carry a card that describes your disease and details of your medicine and dosage times. What side effects may I notice from receiving this medication? Side effects that you should report to your doctor or health care professional as soon as possible: allergic reactions like skin rash, itching or hives, swelling of the face, lips, or tongue bone pain fever, chills nausea, vomiting signs and symptoms of bleeding such as bloody or black, tarry stools; red or dark-brown urine; spitting up blood or brown material that looks like coffee grounds; red spots on the skin; unusual bruising or bleeding from the eye, gums, or nose signs and symptoms of a blood clot such as chest pain; shortness of breath; pain, swelling, or warmth in the leg signs and symptoms of a stroke such as changes in vision; confusion; trouble speaking or understanding; severe headaches; sudden numbness or weakness of the face, arm or leg; trouble walking; dizziness; loss of coordination Side effects that usually do not require medical attention (report to your doctor or health care professional if they continue or are bothersome): hair loss pain, redness, or irritation at site where injected This list may not describe all  possible side effects. Call your doctor for medical advice about side effects. You may report side effects to FDA at 1-800-FDA-1088. Where should I keep my medication? Keep out of the reach of children. Store unopened vials at room temperature between 15 and 30 degrees C (59 and 86 degrees F). Do not freeze. Do not use if solution is discolored or particulate matter is present. Throw away any unused medicine after the expiration date. NOTE: This sheet is a summary. It may not cover all possible information. If you have questions about this medicine, talk to your doctor, pharmacist, or health care provider.  2023 Elsevier/Gold Standard (2004-11-06 00:00:00)  

## 2021-10-25 NOTE — Progress Notes (Signed)
Accessed Medtronic pump left upper abdomen using sterile technique without difficulty. Aspirated 2.8 ml (2.6 ml pump balance) of clear fluid (confirmed w/ipad). Instilled slowly aspirating every  3 ml for a total of 25,000 Units Heparin in 20 ml NS without difficulty. Patient tolerated procedure well. Confirmed next appointment is 11/23/2021. Discharged in good condition.

## 2021-11-13 ENCOUNTER — Encounter (HOSPITAL_COMMUNITY)
Admission: RE | Admit: 2021-11-13 | Discharge: 2021-11-13 | Disposition: A | Discharge status: Home / Self Care | Payer: Medicare Other | Source: Ambulatory Visit | Attending: Internal Medicine | Admitting: Internal Medicine

## 2021-11-13 DIAGNOSIS — C73 Malignant neoplasm of thyroid gland: Secondary | ICD-10-CM | POA: Insufficient documentation

## 2021-11-13 MED ORDER — STERILE WATER FOR INJECTION IJ SOLN
INTRAMUSCULAR | Status: AC
  Administered 2021-11-13: 1.2 mL
  Filled 2021-11-13: qty 10

## 2021-11-13 MED ORDER — THYROTROPIN ALFA 0.9 MG IM SOLR
0.9000 mg | INTRAMUSCULAR | Status: AC
  Administered 2021-11-13: 0.9 mg via INTRAMUSCULAR

## 2021-11-14 ENCOUNTER — Encounter (HOSPITAL_COMMUNITY)
Admission: RE | Admit: 2021-11-14 | Discharge: 2021-11-14 | Disposition: A | Discharge status: Home / Self Care | Payer: Medicare Other | Source: Ambulatory Visit | Attending: Internal Medicine | Admitting: Internal Medicine

## 2021-11-14 DIAGNOSIS — C73 Malignant neoplasm of thyroid gland: Secondary | ICD-10-CM | POA: Diagnosis not present

## 2021-11-14 MED ORDER — STERILE WATER FOR INJECTION IJ SOLN
INTRAMUSCULAR | Status: AC
  Filled 2021-11-14: qty 10

## 2021-11-14 MED ORDER — THYROTROPIN ALFA 0.9 MG IM SOLR
0.9000 mg | INTRAMUSCULAR | Status: AC
  Administered 2021-11-14: 0.9 mg via INTRAMUSCULAR

## 2021-11-15 ENCOUNTER — Encounter (HOSPITAL_COMMUNITY)
Admission: RE | Admit: 2021-11-15 | Discharge: 2021-11-15 | Disposition: A | Discharge status: Home / Self Care | Payer: Medicare Other | Source: Ambulatory Visit | Attending: Internal Medicine | Admitting: Internal Medicine

## 2021-11-15 DIAGNOSIS — C73 Malignant neoplasm of thyroid gland: Secondary | ICD-10-CM | POA: Diagnosis not present

## 2021-11-15 MED ORDER — SODIUM IODIDE I 131 CAPSULE
117.3000 | Freq: Once | INTRAVENOUS | Status: AC | PRN
  Administered 2021-11-15: 117.3 via ORAL

## 2021-11-15 NOTE — Addendum Note (Signed)
Encounter addended by: Gus Height, MD on: 11/15/2021 3:48 PM  Actions taken: Clinical Note Signed

## 2021-11-21 ENCOUNTER — Inpatient Hospital Stay: Payer: Medicare Other | Attending: Oncology

## 2021-11-21 ENCOUNTER — Inpatient Hospital Stay (HOSPITAL_BASED_OUTPATIENT_CLINIC_OR_DEPARTMENT_OTHER): Payer: Medicare Other | Admitting: Oncology

## 2021-11-21 ENCOUNTER — Ambulatory Visit: Payer: Medicare Other

## 2021-11-21 VITALS — BP 109/63 | HR 94 | Temp 98.1°F | Resp 18 | Ht 64.0 in | Wt 126.4 lb

## 2021-11-21 DIAGNOSIS — Z85850 Personal history of malignant neoplasm of thyroid: Secondary | ICD-10-CM | POA: Diagnosis present

## 2021-11-21 DIAGNOSIS — C221 Intrahepatic bile duct carcinoma: Secondary | ICD-10-CM | POA: Diagnosis not present

## 2021-11-21 DIAGNOSIS — Z452 Encounter for adjustment and management of vascular access device: Secondary | ICD-10-CM | POA: Insufficient documentation

## 2021-11-21 DIAGNOSIS — Z8505 Personal history of malignant neoplasm of liver: Secondary | ICD-10-CM | POA: Insufficient documentation

## 2021-11-21 DIAGNOSIS — Z95828 Presence of other vascular implants and grafts: Secondary | ICD-10-CM

## 2021-11-21 MED ORDER — SODIUM CHLORIDE 0.9% FLUSH
10.0000 mL | Freq: Once | INTRAVENOUS | Status: AC
  Administered 2021-11-21: 10 mL

## 2021-11-21 MED ORDER — HEPARIN SOD (PORK) LOCK FLUSH 100 UNIT/ML IV SOLN
500.0000 [IU] | Freq: Once | INTRAVENOUS | Status: AC | PRN
  Administered 2021-11-21: 500 [IU]

## 2021-11-21 MED ORDER — SODIUM CHLORIDE (PF) 0.9 % IJ SOLN
Freq: Once | INTRAMUSCULAR | Status: AC
  Filled 2021-11-21: qty 5

## 2021-11-21 MED ORDER — SODIUM CHLORIDE 0.9% FLUSH: 10.0000 mL | INTRAVENOUS | Status: DC | PRN

## 2021-11-21 NOTE — Patient Instructions (Signed)
Heparin injection What is this medication? HEPARIN (HEP a rin) is an anticoagulant. It is used to treat or prevent clots in the veins, arteries, lungs, or heart. It stops clots from forming or getting bigger. This medicine prevents clotting during open-heart surgery, dialysis, or in patients who are confined to bed. This medicine may be used for other purposes; ask your health care provider or pharmacist if you have questions. COMMON BRAND NAME(S): Hep-Lock, Hep-Lock U/P, Hepflush-10, Monoject Prefill Advanced Heparin Lock Flush, SASH Normal Saline and Heparin What should I tell my care team before I take this medication? They need to know if you have any of these conditions: bleeding disorders, such as hemophilia or low blood platelets bowel disease or diverticulitis endocarditis high blood pressure liver disease recent surgery or delivery of a baby stomach ulcers an unusual or allergic reaction to heparin, benzyl alcohol, sulfites, other medicines, foods, dyes, or preservatives pregnant or trying to get pregnant breast-feeding How should I use this medication? This medicine is given by injection or infusion into a vein. It can also be given by injection of small amounts under the skin. It is usually given by a health care professional in a hospital or clinic setting. If you get this medicine at home, you will be taught how to prepare and give this medicine. Use exactly as directed. Take your medicine at regular intervals. Do not take it more often than directed. Do not stop taking except on your doctor's advice. Stopping this medicine may increase your risk of a blot clot. Be sure to refill your prescription before you run out of medicine. It is important that you put your used needles and syringes in a special sharps container. Do not put them in a trash can. If you do not have a sharps container, call your pharmacist or healthcare provider to get one. Talk to your pediatrician regarding the  use of this medicine in children. While this medicine may be prescribed for children for selected conditions, precautions do apply. Overdosage: If you think you have taken too much of this medicine contact a poison control center or emergency room at once. NOTE: This medicine is only for you. Do not share this medicine with others. What if I miss a dose? If you miss a dose, take it as soon as you can. If it is almost time for your next dose, take only that dose. Do not take double or extra doses. What may interact with this medication? Do not take this medicine with any of the following medications: aspirin and aspirin-like drugs mifepristone medicines that treat or prevent blood clots like warfarin, enoxaparin, and dalteparin palifermin protamine This medicine may also interact with the following medications: dextran digoxin hydroxychloroquine medicines for treating colds or allergies nicotine NSAIDs, medicines for pain and inflammation, like ibuprofen or naproxen phenylbutazone tetracycline antibiotics This list may not describe all possible interactions. Give your health care provider a list of all the medicines, herbs, non-prescription drugs, or dietary supplements you use. Also tell them if you smoke, drink alcohol, or use illegal drugs. Some items may interact with your medicine. What should I watch for while using this medication? Visit your healthcare professional for regular checks on your progress. You may need blood work done while you are taking this medicine. Your condition will be monitored carefully while you are receiving this medicine. It is important not to miss any appointments. Wear a medical ID bracelet or chain, and carry a card that describes your disease and details   of your medicine and dosage times. Notify your doctor or healthcare professional at once if you have cold, blue hands or feet. If you are going to need surgery or other procedure, tell your healthcare  professional that you are using this medicine. Avoid sports and activities that might cause injury while you are using this medicine. Severe falls or injuries can cause unseen bleeding. Be careful when using sharp tools or knives. Consider using an electric razor. Take special care brushing or flossing your teeth. Report any injuries, bruising, or red spots on the skin to your healthcare professional. Using this medicine for a long time may weaken your bones and increase the risk of bone fractures. You should make sure that you get enough calcium and vitamin D while you are taking this medicine. Discuss the foods you eat and the vitamins you take with your healthcare professional. Wear a medical ID bracelet or chain. Carry a card that describes your disease and details of your medicine and dosage times. What side effects may I notice from receiving this medication? Side effects that you should report to your doctor or health care professional as soon as possible: allergic reactions like skin rash, itching or hives, swelling of the face, lips, or tongue bone pain fever, chills nausea, vomiting signs and symptoms of bleeding such as bloody or black, tarry stools; red or dark-brown urine; spitting up blood or brown material that looks like coffee grounds; red spots on the skin; unusual bruising or bleeding from the eye, gums, or nose signs and symptoms of a blood clot such as chest pain; shortness of breath; pain, swelling, or warmth in the leg signs and symptoms of a stroke such as changes in vision; confusion; trouble speaking or understanding; severe headaches; sudden numbness or weakness of the face, arm or leg; trouble walking; dizziness; loss of coordination Side effects that usually do not require medical attention (report to your doctor or health care professional if they continue or are bothersome): hair loss pain, redness, or irritation at site where injected This list may not describe all  possible side effects. Call your doctor for medical advice about side effects. You may report side effects to FDA at 1-800-FDA-1088. Where should I keep my medication? Keep out of the reach of children. Store unopened vials at room temperature between 15 and 30 degrees C (59 and 86 degrees F). Do not freeze. Do not use if solution is discolored or particulate matter is present. Throw away any unused medicine after the expiration date. NOTE: This sheet is a summary. It may not cover all possible information. If you have questions about this medicine, talk to your doctor, pharmacist, or health care provider.  2023 Elsevier/Gold Standard (2004-11-06 00:00:00)  

## 2021-11-21 NOTE — Progress Notes (Signed)
[place] OFFICE PROGRESS NOTE   Diagnosis: Cholangiocarcinoma, thyroid cancer  INTERVAL HISTORY:   Angela Fleming returns as scheduled.  She remains off of specific therapy for cholangiocarcinoma.  No abdominal pain.  She is followed by Dr. Buddy Duty for regulation of thyroid hormone replacement.  She reports being unable to sleep she was on a higher dose of thyroid hormone. She underwent radioactive iodine treatment on 11/13/2021. She continues to have intermittent hives, chiefly at the neck. Objective:  Vital signs in last 24 hours:  Blood pressure 109/63, pulse 94, temperature 98.1 F (36.7 C), temperature source Oral, resp. rate 18, height 5' 4"  (1.626 m), weight 126 lb 6.4 oz (57.3 kg), SpO2 100 %.    HEENT: Neck without mass Lymphatics: No cervical, supraclavicular, axillary, or inguinal nodes Resp: Lungs clear bilaterally Cardio: Regular rate and rhythm GI: No hepatosplenomegaly, left abdomen infusion pump Vascular: No leg edema  Skin: No hives  Portacath/PICC-without erythema  Lab Results:  Lab Results  Component Value Date   WBC 5.0 10/07/2020   HGB 12.2 10/07/2020   HCT 37.9 10/07/2020   MCV 90.2 10/07/2020   PLT 185 10/07/2020   NEUTROABS 3.0 10/07/2020    CMP  Lab Results  Component Value Date   NA 135 10/12/2021   K 4.0 10/12/2021   CL 101 10/12/2021   CO2 27 10/12/2021   GLUCOSE 98 10/12/2021   BUN 14 10/12/2021   CREATININE 0.69 10/12/2021   CALCIUM 9.3 10/12/2021   PROT 7.0 10/12/2021   ALBUMIN 4.5 10/12/2021   AST 23 10/12/2021   ALT 26 10/12/2021   ALKPHOS 205 (H) 10/12/2021   BILITOT 0.7 10/12/2021   GFRNONAA >60 10/12/2021   GFRAA >60 05/13/2019    Lab Results  Component Value Date   CEA1 1.02 01/02/2019   IFB379 5 01/02/2019    Medications: I have reviewed the patient's current medications.   Assessment/Plan: Intrahepatic cholangiocarcinoma Abdominal ultrasound 10/08/2018-heterogenous right hepatic mass MRI abdomen  10/14/2018-2 enhancing right liver lesions, 4.8 x 4.5 x 4.1 cm, more superior lesion measured 3 x 2 x 2.5 cm, multiple additional subcentimeter T2 hyperintense lesions-too small to characterize. CT chest 11/04/2018-6 mm left upper lobe groundglass nodule, no evidence of metastatic disease in the chest Biopsy of right liver lesion at [facility] 11/10/2018-moderately differentiated adenocarcinoma consistent with cholangiocarcinoma; IDH1 alteration; MSI stable; mismatch repair status proficient; tumor mutational burden low, 3; PD-L1 negative CTs at [facility] 12/12/2018-right hepatic lobe masses, satellite lesions adjacent to the dominant mass, prominent periportal and aortocaval lymph nodes prominent left supraclavicular node Cycle 1 gemcitabine/cisplatin at [place] on 12/12/2018 Cycle 2 gemcitabine/cisplatin 01/02/2019, 01/09/2019 Cycle 3 gemcitabine/cisplatin 01/23/2019 CTs at [facility] 02/02/2019-decrease in size of multiple hepatic masses, decreased size of prominent periportal and aortocaval nodes, unchanged prominent left supraclavicular node Cycle 4 gemcitabine/cisplatin 02/11/2019, Udenyca added and gemcitabine dose reduced with day 8 chemotherapy Cycle 5 gemcitabine/cisplatin 03/04/2019, gemcitabine reescalated to full dose Cycle 6 gemcitabine/cisplatin 03/25/2019 Cycle 7 gemcitabine/cisplatin 04/15/2019 Cycle 8 gemcitabine/cisplatin 05/06/2019 CT 05/18/2019-decreased size of multiple right hepatic masses 06/17/2019-hepatic arterial infusion pump placed, resection of hepatic artery/portal nodes, 1/7 portal nodes positive 07/10/2019-FUDR No. 1 07/23/2019 gemcitabine 800 mg/m added 08/06/2019-FUDR No. 2, 55 mg (dose reduced due to mild increase in bilirubin (, gemcitabine 800 mg/m and oxaliplatin 68 mg/m 08/20/2019-treatment continued, Decadron and pump continue due to mild increase in alkaline phosphatase 09/17/2019, FUDR No. 3 at 28 mg (dose reduced due to continued increase in alkaline phosphatase) 10/01/2019, CTs  with continued response  to therapy, decrease in liver disease 10/01/2019, therapy continued 10/15/2019, therapy continue with gemcitabine and oxaliplatin, FUDR restarted at 55 mg 11/12/2019-alkaline phosphatase elevated, FUDR held, Decadron given in hepatic infusion flush 11/26/2019 chemotherapy held secondary to elevated liver enzymes 11/26/2019-CT with continued improvement in the hepatic tumor burden 12/10/2019-chemotherapy restarted with gemcitabine/oxaliplatin 12/24/2019 chemotherapy continued, FUDR 28 mg 02/04/2020-oxaliplatin held secondary to worsening neuropathy 03/02/2020-CTs-stable disease Treatment continued with FUDR and gemcitabine CTs 05/26/2020-unchanged treated lesions in the liver, unchanged left upper lobe pulmonary nodules, no evidence of disease progression 05/26/2020-recommendation to continue current therapy with gemcitabine plus FUDR (as labs allow) 06/09/2020-FUDR held 06/23/2020-FUDR held, continue gemcitabine, steroids given in HIA flush 07/21/2020 every 2-week Gemcitabine CTs at [facility] 08/18/2020-stable treated hepatic metastases, no evidence of new or enlarging metastatic disease Every 2-week gemcitabine continued 08/31/2020 gemcitabine, Udenyca MRI 09/16/2020 at [facility]-irregularly shaped 1.9 cm lesion in hepatic segment 5 favored to represent posttreatment fibrosis.  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  Progressively increasing intrahepatic ductal dilatation over the past several exams. ERCP at [facility] 10/03/2020-single severe localized biliary stricture was found in the common hepatic duct.  The stricture was fibrotic.  The left and right hepatic ducts and all intrahepatic branches were moderately dilated.  The upper third of the main bile duct was successfully dilated.  Plastic biliary stent was placed into the common bile duct.  Brushings were obtained in the upper third of the main bile duct-no evidence of malignancy. PET  CT at [facility] 10/20/2020-decreased intrahepatic duct dilation, previously described segment 5 lesion without increased FDG activity, hypermetabolic left thyroid lesion CTs at [facility] 12/26/2020-stable tiny bilateral pulmonary nodules, stable mild Intermatic biliary ductal dilatation, mild thickening of the common bile duct, stable subcentimeter low-attenuation liver lesion ERCP 01/12/2021-severe localized complex stenoses in the common hepatic duct, hepatic bifurcation, left main hepatic duct, and right main hepatic duct.  A plastic stent was placed into the right hepatic duct CTs 03/29/2021-similar appearance of liver lesions and delayed enhancing mass at the tip of the right lobe of the liver.  Similar bilateral pulmonary nodules. CTs 06/28/2021-unchanged liver lesions, no new metastatic disease, stable lung nodules.  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 ERCP with right and left hepatic stent exchange 07/27/2021 ERCP 10/26/2021-single biliary stricture in the common hepatic duct, left and right main hepatic duct dilated, plastic stents placed in the left and right hepatic ducts CTs 09/28/2021-no evidence of progressive metastatic disease Family history of multiple cancers eluding breast, pancreatic, melanoma, appendiceal cancer, lung cancer Osteoporosis Neutropenia secondary to chemotherapy, Udenyca added with day 8 cycle 4 Hypermetabolic left thyroid nodule on PET 10/20/2020; thyroid nodule and left neck lymph node biopsy 12/12/2020-both diagnostic of malignancy, papillary thyroid carcinoma; thyroidectomy 03/14/2021 with pathology revealing papillary thyroid cancer with 1 of 8 nodes positive CT neck 09/28/2021-status post thyroidectomy without evidence of residual or recurrent disease Remnant ablation and  adjuvant thyroid cancer therapy with oral I-131 11/13/2021       Disposition: Angela Fleming appears stable.  She is in clinical remission from cholangiocarcinoma.  She will continue imaging follow-up and management of the bile duct stricture at [facility].  We continue to flush the hepatic infusion pump and Port-A-Cath every month.  She completed surgery and adjuvant I-131 therapy for thyroid cancer.  She is followed  Dr. Buddy Duty for thyroid hormone replacement.  She will be scheduled for an office visit in 3 months.  [name], MD  11/21/2021  12:03 PM

## 2021-11-21 NOTE — Progress Notes (Signed)
Accessed Medtronic pump left upper abdomen using sterile technique without difficulty. Aspirated 5.0 ml (3.2 ml pump balance) of clear fluid (confirmed w/ipad). Instilled slowly aspirating every  3 ml for a total of 25,000 Units Heparin in 20 ml NS without difficulty. Patient tolerated procedure well. Confirmed next appointment is 12/20/2021. Discharged in good condition.

## 2021-11-27 ENCOUNTER — Encounter (HOSPITAL_COMMUNITY)
Admission: RE | Admit: 2021-11-27 | Discharge: 2021-11-27 | Disposition: A | Discharge status: Home / Self Care | Payer: Medicare Other | Source: Ambulatory Visit | Attending: Internal Medicine | Admitting: Internal Medicine

## 2021-11-27 ENCOUNTER — Ambulatory Visit (HOSPITAL_COMMUNITY)
Admission: RE | Admit: 2021-11-27 | Discharge: 2021-11-27 | Disposition: A | Discharge status: Home / Self Care | Payer: Medicare Other | Source: Ambulatory Visit | Attending: Internal Medicine | Admitting: Internal Medicine

## 2021-11-27 ENCOUNTER — Encounter (HOSPITAL_COMMUNITY): Payer: Self-pay

## 2021-11-27 DIAGNOSIS — C73 Malignant neoplasm of thyroid gland: Secondary | ICD-10-CM | POA: Insufficient documentation

## 2021-11-27 MED ORDER — TECHNETIUM TC 99M MEBROFENIN IV KIT
5.4000 | PACK | Freq: Once | INTRAVENOUS | Status: AC | PRN
  Administered 2021-11-27: 5.4 via INTRAVENOUS

## 2021-11-28 ENCOUNTER — Ambulatory Visit: Payer: Medicare Other | Admitting: Oncology

## 2021-12-19 ENCOUNTER — Inpatient Hospital Stay: Payer: Medicare Other | Attending: Oncology

## 2021-12-19 ENCOUNTER — Inpatient Hospital Stay: Payer: Medicare Other

## 2021-12-19 VITALS — BP 107/68 | HR 75 | Temp 98.9°F | Resp 20 | Ht 64.0 in | Wt 127.1 lb

## 2021-12-19 DIAGNOSIS — Z452 Encounter for adjustment and management of vascular access device: Secondary | ICD-10-CM | POA: Insufficient documentation

## 2021-12-19 DIAGNOSIS — Z85850 Personal history of malignant neoplasm of thyroid: Secondary | ICD-10-CM | POA: Diagnosis present

## 2021-12-19 DIAGNOSIS — Z8505 Personal history of malignant neoplasm of liver: Secondary | ICD-10-CM | POA: Insufficient documentation

## 2021-12-19 DIAGNOSIS — C221 Intrahepatic bile duct carcinoma: Secondary | ICD-10-CM

## 2021-12-19 DIAGNOSIS — Z95828 Presence of other vascular implants and grafts: Secondary | ICD-10-CM

## 2021-12-19 LAB — CMP (CANCER CENTER ONLY)
ALT: 48 U/L — ABNORMAL HIGH (ref 0–44)
AST: 59 U/L — ABNORMAL HIGH (ref 15–41)
Albumin: 4.4 g/dL (ref 3.5–5.0)
Alkaline Phosphatase: 158 U/L — ABNORMAL HIGH (ref 38–126)
Anion gap: 8 (ref 5–15)
BUN: 20 mg/dL (ref 8–23)
CO2: 26 mmol/L (ref 22–32)
Calcium: 9.1 mg/dL (ref 8.9–10.3)
Chloride: 105 mmol/L (ref 98–111)
Creatinine: 0.93 mg/dL (ref 0.44–1.00)
GFR, Estimated: >60 mL/min (ref >60)
Glucose, Bld: 91 mg/dL (ref 70–99)
Potassium: 4.2 mmol/L (ref 3.5–5.1)
Sodium: 139 mmol/L (ref 135–145)
Total Bilirubin: 0.4 mg/dL (ref 0.3–1.2)
Total Protein: 6.7 g/dL (ref 6.5–8.1)

## 2021-12-19 MED ORDER — SODIUM CHLORIDE 0.9% FLUSH
10.0000 mL | Freq: Once | INTRAVENOUS | Status: AC
  Administered 2021-12-19: 10 mL

## 2021-12-19 MED ORDER — SODIUM CHLORIDE (PF) 0.9 % IJ SOLN
Freq: Once | INTRAMUSCULAR | Status: AC
  Filled 2021-12-19: qty 5

## 2021-12-19 MED ORDER — HEPARIN SOD (PORK) LOCK FLUSH 100 UNIT/ML IV SOLN
500.0000 [IU] | Freq: Once | INTRAVENOUS | Status: AC | PRN
  Administered 2021-12-19: 500 [IU]

## 2021-12-19 MED ORDER — SODIUM CHLORIDE 0.9% FLUSH: 10.0000 mL | INTRAVENOUS | Status: DC | PRN

## 2021-12-19 NOTE — Patient Instructions (Signed)

## 2021-12-19 NOTE — Progress Notes (Signed)
  Accessed Medtronic pump left upper abdomen using sterile technique without difficulty. Aspirated 4.0 ml (3.2 ml pump balance) of clear fluid (confirmed w/ipad). Instilled slowly aspirating every  3 ml for a total of 25,000 Units Heparin in 20 ml NS without difficulty. Patient tolerated procedure well. Confirmed next appointment is 12/17/2021. Discharged in good condition.

## 2022-01-12 ENCOUNTER — Encounter: Payer: Self-pay | Admitting: Oncology

## 2022-01-16 ENCOUNTER — Inpatient Hospital Stay: Payer: Medicare Other

## 2022-01-17 ENCOUNTER — Encounter: Payer: Self-pay | Admitting: *Deleted

## 2022-01-18 ENCOUNTER — Encounter: Payer: Self-pay | Admitting: Oncology

## 2022-02-13 ENCOUNTER — Ambulatory Visit: Payer: Medicare Other

## 2022-02-13 ENCOUNTER — Ambulatory Visit: Payer: Medicare Other | Admitting: Oncology

## 2022-03-12 ENCOUNTER — Other Ambulatory Visit: Payer: Self-pay | Admitting: *Deleted

## 2022-03-12 ENCOUNTER — Inpatient Hospital Stay: Payer: Medicare Other | Attending: Oncology

## 2022-03-12 ENCOUNTER — Inpatient Hospital Stay (HOSPITAL_BASED_OUTPATIENT_CLINIC_OR_DEPARTMENT_OTHER): Payer: Medicare Other | Admitting: Oncology

## 2022-03-12 VITALS — BP 119/67 | HR 82 | Temp 97.9°F | Resp 20 | Ht 64.0 in | Wt 123.0 lb

## 2022-03-12 DIAGNOSIS — Z85850 Personal history of malignant neoplasm of thyroid: Secondary | ICD-10-CM | POA: Diagnosis present

## 2022-03-12 DIAGNOSIS — M810 Age-related osteoporosis without current pathological fracture: Secondary | ICD-10-CM | POA: Insufficient documentation

## 2022-03-12 DIAGNOSIS — Z95828 Presence of other vascular implants and grafts: Secondary | ICD-10-CM

## 2022-03-12 DIAGNOSIS — Z800 Family history of malignant neoplasm of digestive organs: Secondary | ICD-10-CM | POA: Insufficient documentation

## 2022-03-12 DIAGNOSIS — Z808 Family history of malignant neoplasm of other organs or systems: Secondary | ICD-10-CM | POA: Insufficient documentation

## 2022-03-12 DIAGNOSIS — Z801 Family history of malignant neoplasm of trachea, bronchus and lung: Secondary | ICD-10-CM | POA: Insufficient documentation

## 2022-03-12 DIAGNOSIS — L608 Other nail disorders: Secondary | ICD-10-CM | POA: Diagnosis not present

## 2022-03-12 DIAGNOSIS — C221 Intrahepatic bile duct carcinoma: Secondary | ICD-10-CM | POA: Diagnosis not present

## 2022-03-12 DIAGNOSIS — Z803 Family history of malignant neoplasm of breast: Secondary | ICD-10-CM | POA: Diagnosis not present

## 2022-03-12 DIAGNOSIS — Z8505 Personal history of malignant neoplasm of liver: Secondary | ICD-10-CM | POA: Insufficient documentation

## 2022-03-12 MED ORDER — SODIUM CHLORIDE 0.9% FLUSH
10.0000 mL | INTRAVENOUS | Status: DC | PRN
  Administered 2022-03-12: 10 mL

## 2022-03-12 MED ORDER — HEPARIN SOD (PORK) LOCK FLUSH 100 UNIT/ML IV SOLN
500.0000 [IU] | Freq: Once | INTRAVENOUS | Status: AC | PRN
  Administered 2022-03-12: 500 [IU]

## 2022-03-12 MED ORDER — MUPIROCIN 2 % EX OINT: 1.0000 | TOPICAL_OINTMENT | Freq: Two times a day (BID) | CUTANEOUS | 0 refills | Status: DC

## 2022-03-12 NOTE — Progress Notes (Signed)
[place] OFFICE PROGRESS NOTE   Diagnosis: Cholangiocarcinoma  INTERVAL HISTORY:   Ms. Hershkowitz returns as scheduled.  She was found to have disease progression involving a segment 5 lesion in the hepatic hilum on a CT and MRI.  She completed a course of radiation 02/13/2022 - 02/19/2022.  She reports developing malaise and upper abdominal discomfort following the HIA pump interrogation and radiation.  She now feels well.  She cut the right great toenail recently and has developed induration surrounding the nailbed.  Bacitracin ointment has not helped.  Objective:  Vital signs in last 24 hours:  Blood pressure 119/67, pulse 82, temperature 97.9 F (36.6 C), resp. rate 20, height '5\' 4"'$  (1.626 m), weight 123 lb (55.8 kg), SpO2 100 %.    Lymphatics: No cervical, supraclavicular, axillary, or inguinal nodes Resp: Lungs clear bilaterally Cardio: Regular rate and rhythm GI: No hepatosplenomegaly, no mass, left abdomen infusion pump Vascular: No leg edema  Skin: Induration surrounding the right great toenail  Portacath/PICC-without erythema  Lab Results:  Lab Results  Component Value Date   WBC 5.0 10/07/2020   HGB 12.2 10/07/2020   HCT 37.9 10/07/2020   MCV 90.2 10/07/2020   PLT 185 10/07/2020   NEUTROABS 3.0 10/07/2020    CMP  Lab Results  Component Value Date   NA 139 12/19/2021   K 4.2 12/19/2021   CL 105 12/19/2021   CO2 26 12/19/2021   GLUCOSE 91 12/19/2021   BUN 20 12/19/2021   CREATININE 0.93 12/19/2021   CALCIUM 9.1 12/19/2021   PROT 6.7 12/19/2021   ALBUMIN 4.4 12/19/2021   AST 59 (H) 12/19/2021   ALT 48 (H) 12/19/2021   ALKPHOS 158 (H) 12/19/2021   BILITOT 0.4 12/19/2021   GFRNONAA >60 12/19/2021   GFRAA >60 05/13/2019    Lab Results  Component Value Date   CEA1 1.02 01/02/2019   EGB151 5 01/02/2019    No results found for: "INR", "LABPROT"  Imaging:  No results found.  Medications: I have reviewed the patient's current  medications.   Assessment/Plan: Intrahepatic cholangiocarcinoma Abdominal ultrasound 10/08/2018-heterogenous right hepatic mass MRI abdomen 10/14/2018-2 enhancing right liver lesions, 4.8 x 4.5 x 4.1 cm, more superior lesion measured 3 x 2 x 2.5 cm, multiple additional subcentimeter T2 hyperintense lesions-too small to characterize. CT chest 11/04/2018-6 mm left upper lobe groundglass nodule, no evidence of metastatic disease in the chest Biopsy of right liver lesion at [facility] 11/10/2018-moderately differentiated adenocarcinoma consistent with cholangiocarcinoma; IDH1 alteration; MSI stable; mismatch repair status proficient; tumor mutational burden low, 3; PD-L1 negative CTs at [facility] 12/12/2018-right hepatic lobe masses, satellite lesions adjacent to the dominant mass, prominent periportal and aortocaval lymph nodes prominent left supraclavicular node Cycle 1 gemcitabine/cisplatin at [place] on 12/12/2018 Cycle 2 gemcitabine/cisplatin 01/02/2019, 01/09/2019 Cycle 3 gemcitabine/cisplatin 01/23/2019 CTs at [facility] 02/02/2019-decrease in size of multiple hepatic masses, decreased size of prominent periportal and aortocaval nodes, unchanged prominent left supraclavicular node Cycle 4 gemcitabine/cisplatin 02/11/2019, Udenyca added and gemcitabine dose reduced with day 8 chemotherapy Cycle 5 gemcitabine/cisplatin 03/04/2019, gemcitabine reescalated to full dose Cycle 6 gemcitabine/cisplatin 03/25/2019 Cycle 7 gemcitabine/cisplatin 04/15/2019 Cycle 8 gemcitabine/cisplatin 05/06/2019 CT 05/18/2019-decreased size of multiple right hepatic masses 06/17/2019-hepatic arterial infusion pump placed, resection of hepatic artery/portal nodes, 1/7 portal nodes positive 07/10/2019-FUDR No. 1 07/23/2019 gemcitabine 800 mg/m added 08/06/2019-FUDR No. 2, 55 mg (dose reduced due to mild increase in bilirubin (, gemcitabine 800 mg/m and oxaliplatin 68 mg/m 08/20/2019-treatment continued, Decadron and pump continue due to mild  increase in alkaline phosphatase 09/17/2019, FUDR No. 3 at 28 mg (dose reduced due to continued increase in alkaline phosphatase) 10/01/2019, CTs with continued response to therapy, decrease in liver disease 10/01/2019, therapy continued 10/15/2019, therapy continue with gemcitabine and oxaliplatin, FUDR restarted at 55 mg 11/12/2019-alkaline phosphatase elevated, FUDR held, Decadron given in hepatic infusion flush 11/26/2019 chemotherapy held secondary to elevated liver enzymes 11/26/2019-CT with continued improvement in the hepatic tumor burden 12/10/2019-chemotherapy restarted with gemcitabine/oxaliplatin 12/24/2019 chemotherapy continued, FUDR 28 mg 02/04/2020-oxaliplatin held secondary to worsening neuropathy 03/02/2020-CTs-stable disease Treatment continued with FUDR and gemcitabine CTs 05/26/2020-unchanged treated lesions in the liver, unchanged left upper lobe pulmonary nodules, no evidence of disease progression 05/26/2020-recommendation to continue current therapy with gemcitabine plus FUDR (as labs allow) 06/09/2020-FUDR held 06/23/2020-FUDR held, continue gemcitabine, steroids given in HIA flush 07/21/2020 every 2-week Gemcitabine CTs at [facility] 08/18/2020-stable treated hepatic metastases, no evidence of new or enlarging metastatic disease Every 2-week gemcitabine continued 08/31/2020 gemcitabine, Udenyca MRI 09/16/2020 at [facility]-irregularly shaped 1.9 cm lesion in hepatic segment 5 favored to represent posttreatment fibrosis.  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  Progressively increasing intrahepatic ductal dilatation over the past several exams. ERCP at [facility] 10/03/2020-single severe localized biliary stricture was found in the common hepatic duct.  The stricture was fibrotic.  The left and right hepatic ducts and all intrahepatic branches were moderately dilated.  The upper third of the main bile duct was successfully dilated.  Plastic  biliary stent was placed into the common bile duct.  Brushings were obtained in the upper third of the main bile duct-no evidence of malignancy. PET CT at [facility] 10/20/2020-decreased intrahepatic duct dilation, previously described segment 5 lesion without increased FDG activity, hypermetabolic left thyroid lesion CTs at [facility] 12/26/2020-stable tiny bilateral pulmonary nodules, stable mild Intermatic biliary ductal dilatation, mild thickening of the common bile duct, stable subcentimeter low-attenuation liver lesion ERCP 01/12/2021-severe localized complex stenoses in the common hepatic duct, hepatic bifurcation, left main hepatic duct, and right main hepatic duct.  A plastic stent was placed into the right hepatic duct CTs 03/29/2021-similar appearance of liver lesions and delayed enhancing mass at the tip of the right lobe of the liver.  Similar bilateral pulmonary nodules. CTs 06/28/2021-unchanged liver lesions, no new metastatic disease, stable lung nodules.  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 ERCP with right and left hepatic stent exchange 07/27/2021 ERCP 10/26/2021-single biliary stricture in the common hepatic duct, left and right main hepatic duct dilated, plastic stents placed in the left and right hepatic ducts CTs 09/28/2021-no evidence of progressive metastatic disease CTs 01/11/2022-increased size of segment 5 hepatic lesion, no evidence of distant metastatic disease, stable sub-5 mm pulmonary nodules, hepatic artery infusion pump terminates in a fluid collection anterior to the hepatic artery MRI liver 01/26/2022-interval increase in size of segment 5 hepatic lesion, new areas of hyperenhancement in the central liver adjacent to the hepatic hilum, decrease in intrahepatic biliary ductal  dilatation with similar short segment stricture at the intrahepatic biliary confluence 01/26/2022-injection of HIA pump showed focal collection of contrast at the HIA catheter tip consistent with extravasation-hepatic pump turned off Radiation to liver 02/13/2022 - 02/19/2022, 5000 cGy Family history of multiple cancers eluding breast, pancreatic, melanoma, appendiceal cancer, lung cancer Osteoporosis Neutropenia secondary to chemotherapy, Udenyca added  with day 8 cycle 4 Hypermetabolic left thyroid nodule on PET 10/20/2020; thyroid nodule and left neck lymph node biopsy 12/12/2020-both diagnostic of malignancy, papillary thyroid carcinoma; thyroidectomy 03/14/2021 with pathology revealing papillary thyroid cancer with 1 of 8 nodes positive CT neck 09/28/2021-status post thyroidectomy without evidence of residual or recurrent disease Remnant ablation and adjuvant thyroid cancer therapy with oral I-131 11/13/2021       Disposition: Ms. Andreen appears stable.  She completed a course of radiation to the liver earlier this month.  She has recovered from the radiation toxicity.  She is in clinical remission from thyroid cancer.  She is scheduled for restaging and follow-up with Dr. Fanny Skates and radiation oncology at [facility] in April.Marland Kitchen  She will return for an office visit here after the restaging evaluation.  She will begin a trial of mupirocin for the right great toenail inflammation.  She will call if this does not help.  [name], MD  03/12/2022  12:04 PM

## 2022-04-23 ENCOUNTER — Inpatient Hospital Stay: Payer: Medicare Other

## 2022-04-23 ENCOUNTER — Inpatient Hospital Stay: Payer: Medicare Other | Attending: Oncology

## 2022-04-23 VITALS — Ht 64.0 in | Wt 121.4 lb

## 2022-04-23 DIAGNOSIS — L608 Other nail disorders: Secondary | ICD-10-CM | POA: Diagnosis not present

## 2022-04-23 DIAGNOSIS — Z8505 Personal history of malignant neoplasm of liver: Secondary | ICD-10-CM | POA: Diagnosis present

## 2022-04-23 DIAGNOSIS — Z808 Family history of malignant neoplasm of other organs or systems: Secondary | ICD-10-CM | POA: Insufficient documentation

## 2022-04-23 DIAGNOSIS — Z803 Family history of malignant neoplasm of breast: Secondary | ICD-10-CM | POA: Insufficient documentation

## 2022-04-23 DIAGNOSIS — M810 Age-related osteoporosis without current pathological fracture: Secondary | ICD-10-CM | POA: Diagnosis not present

## 2022-04-23 DIAGNOSIS — Z452 Encounter for adjustment and management of vascular access device: Secondary | ICD-10-CM | POA: Insufficient documentation

## 2022-04-23 DIAGNOSIS — Z801 Family history of malignant neoplasm of trachea, bronchus and lung: Secondary | ICD-10-CM | POA: Diagnosis not present

## 2022-04-23 DIAGNOSIS — Z85850 Personal history of malignant neoplasm of thyroid: Secondary | ICD-10-CM | POA: Diagnosis present

## 2022-04-23 DIAGNOSIS — Z95828 Presence of other vascular implants and grafts: Secondary | ICD-10-CM

## 2022-04-23 DIAGNOSIS — C221 Intrahepatic bile duct carcinoma: Secondary | ICD-10-CM

## 2022-04-23 DIAGNOSIS — Z800 Family history of malignant neoplasm of digestive organs: Secondary | ICD-10-CM | POA: Diagnosis not present

## 2022-04-23 LAB — CMP (CANCER CENTER ONLY)
ALT: 21 U/L (ref 0–44)
AST: 26 U/L (ref 15–41)
Albumin: 4.1 g/dL (ref 3.5–5.0)
Alkaline Phosphatase: 121 U/L (ref 38–126)
Anion gap: 8 (ref 5–15)
BUN: 15 mg/dL (ref 8–23)
CO2: 26 mmol/L (ref 22–32)
Calcium: 9.2 mg/dL (ref 8.9–10.3)
Chloride: 106 mmol/L (ref 98–111)
Creatinine: 0.58 mg/dL (ref 0.44–1.00)
GFR, Estimated: >60 mL/min (ref >60)
Glucose, Bld: 100 mg/dL — ABNORMAL HIGH (ref 70–99)
Potassium: 4 mmol/L (ref 3.5–5.1)
Sodium: 140 mmol/L (ref 135–145)
Total Bilirubin: 0.4 mg/dL (ref 0.3–1.2)
Total Protein: 6.8 g/dL (ref 6.5–8.1)

## 2022-04-23 MED ORDER — SODIUM CHLORIDE 0.9% FLUSH
10.0000 mL | INTRAVENOUS | Status: DC | PRN
  Administered 2022-04-23: 10 mL

## 2022-04-23 MED ORDER — HEPARIN SOD (PORK) LOCK FLUSH 100 UNIT/ML IV SOLN
500.0000 [IU] | Freq: Once | INTRAVENOUS | Status: AC | PRN
  Administered 2022-04-23: 500 [IU]

## 2022-04-23 NOTE — Patient Instructions (Signed)

## 2022-05-28 ENCOUNTER — Other Ambulatory Visit: Payer: Self-pay | Admitting: *Deleted

## 2022-05-28 ENCOUNTER — Encounter: Payer: Self-pay | Admitting: Oncology

## 2022-05-28 DIAGNOSIS — C221 Intrahepatic bile duct carcinoma: Secondary | ICD-10-CM

## 2022-05-29 ENCOUNTER — Inpatient Hospital Stay: Payer: Medicare Other

## 2022-05-29 ENCOUNTER — Inpatient Hospital Stay: Payer: Medicare Other | Attending: Oncology | Admitting: Oncology

## 2022-05-29 VITALS — BP 119/58 | HR 80 | Temp 98.1°F | Resp 20 | Ht 64.0 in | Wt 121.8 lb

## 2022-05-29 DIAGNOSIS — Z95828 Presence of other vascular implants and grafts: Secondary | ICD-10-CM

## 2022-05-29 DIAGNOSIS — Z808 Family history of malignant neoplasm of other organs or systems: Secondary | ICD-10-CM | POA: Diagnosis not present

## 2022-05-29 DIAGNOSIS — M810 Age-related osteoporosis without current pathological fracture: Secondary | ICD-10-CM | POA: Diagnosis not present

## 2022-05-29 DIAGNOSIS — Z800 Family history of malignant neoplasm of digestive organs: Secondary | ICD-10-CM | POA: Diagnosis not present

## 2022-05-29 DIAGNOSIS — Z801 Family history of malignant neoplasm of trachea, bronchus and lung: Secondary | ICD-10-CM | POA: Diagnosis not present

## 2022-05-29 DIAGNOSIS — Z803 Family history of malignant neoplasm of breast: Secondary | ICD-10-CM | POA: Diagnosis not present

## 2022-05-29 DIAGNOSIS — C221 Intrahepatic bile duct carcinoma: Secondary | ICD-10-CM

## 2022-05-29 DIAGNOSIS — Z8505 Personal history of malignant neoplasm of liver: Secondary | ICD-10-CM | POA: Diagnosis not present

## 2022-05-29 DIAGNOSIS — Z85850 Personal history of malignant neoplasm of thyroid: Secondary | ICD-10-CM | POA: Diagnosis present

## 2022-05-29 LAB — CMP (CANCER CENTER ONLY)
ALT: 63 U/L — ABNORMAL HIGH (ref 0–44)
AST: 34 U/L (ref 15–41)
Albumin: 4.4 g/dL (ref 3.5–5.0)
Alkaline Phosphatase: 171 U/L — ABNORMAL HIGH (ref 38–126)
Anion gap: 10 (ref 5–15)
BUN: 15 mg/dL (ref 8–23)
CO2: 24 mmol/L (ref 22–32)
Calcium: 9 mg/dL (ref 8.9–10.3)
Chloride: 104 mmol/L (ref 98–111)
Creatinine: 0.54 mg/dL (ref 0.44–1.00)
GFR, Estimated: >60 mL/min (ref >60)
Glucose, Bld: 91 mg/dL (ref 70–99)
Potassium: 4 mmol/L (ref 3.5–5.1)
Sodium: 138 mmol/L (ref 135–145)
Total Bilirubin: 0.6 mg/dL (ref 0.3–1.2)
Total Protein: 6.3 g/dL — ABNORMAL LOW (ref 6.5–8.1)

## 2022-05-29 MED ORDER — HEPARIN SOD (PORK) LOCK FLUSH 100 UNIT/ML IV SOLN
500.0000 [IU] | Freq: Once | INTRAVENOUS | Status: AC | PRN
  Administered 2022-05-29: 500 [IU]

## 2022-05-29 MED ORDER — SODIUM CHLORIDE 0.9% FLUSH
10.0000 mL | INTRAVENOUS | Status: DC | PRN
  Administered 2022-05-29: 10 mL

## 2022-05-29 NOTE — Progress Notes (Signed)
[facility] OFFICE PROGRESS NOTE   Diagnosis: Cholangiocarcinoma  INTERVAL HISTORY:   Angela Fleming returns as scheduled.  She feels well.  Good appetite and energy level.  She underwent an ERCP and biliary stent exchange last week.  Objective:  Vital signs in last 24 hours:  Blood pressure (!) 119/58, pulse 80, temperature 98.1 F (36.7 C), temperature source Oral, resp. rate 20, height  (1.626 m), weight 121 lb 12.8 oz (55.2 kg), SpO2 100 %.  Resp: Lungs clear bilaterally Cardio: Regular rate and rhythm GI: No mass, no hepatosplenomegaly, left abdomen infusion pump Vascular: No leg edema    Portacath/PICC-without erythema  Lab Results:  Lab Results  Component Value Date   WBC 5.0 10/07/2020   HGB 12.2 10/07/2020   HCT 37.9 10/07/2020   MCV 90.2 10/07/2020   PLT 185 10/07/2020   NEUTROABS 3.0 10/07/2020    CMP  Lab Results  Component Value Date   NA 138 05/29/2022   K 4.0 05/29/2022   CL 104 05/29/2022   CO2 24 05/29/2022   GLUCOSE 91 05/29/2022   BUN 15 05/29/2022   CREATININE 0.54 05/29/2022   CALCIUM 9.0 05/29/2022   PROT 6.3 (L) 05/29/2022   ALBUMIN 4.4 05/29/2022   AST 34 05/29/2022   ALT 63 (H) 05/29/2022   ALKPHOS 171 (H) 05/29/2022   BILITOT 0.6 05/29/2022   GFRNONAA >60 05/29/2022   GFRAA >60 05/13/2019    Lab Results  Component Value Date   CEA1 1.02 01/02/2019   ZOX096 5 01/02/2019    Medications: I have reviewed the patient's current medications.   Assessment/Plan: Intrahepatic cholangiocarcinoma Abdominal ultrasound 10/08/2018-heterogenous right hepatic mass MRI abdomen 10/14/2018-2 enhancing right liver lesions, 4.8 x 4.5 x 4.1 cm, more superior lesion measured 3 x 2 x 2.5 cm, multiple additional subcentimeter T2 hyperintense lesions-too small to characterize. CT chest 11/04/2018-6 mm left upper lobe groundglass nodule, no evidence of metastatic disease in the chest Biopsy of right liver lesion at [facility]  11/10/2018-moderately differentiated adenocarcinoma consistent with cholangiocarcinoma; IDH1 alteration; MSI stable; mismatch repair status proficient; tumor mutational burden low, 3; PD-L1 negative CTs at [facility] 12/12/2018-right hepatic lobe masses, satellite lesions adjacent to the dominant mass, prominent periportal and aortocaval lymph nodes prominent left supraclavicular node Cycle 1 gemcitabine/cisplatin at [facility] on 12/12/2018 Cycle 2 gemcitabine/cisplatin 01/02/2019, 01/09/2019 Cycle 3 gemcitabine/cisplatin 01/23/2019 CTs at [facility] 02/02/2019-decrease in size of multiple hepatic masses, decreased size of prominent periportal and aortocaval nodes, unchanged prominent left supraclavicular node Cycle 4 gemcitabine/cisplatin 02/11/2019, Udenyca added and gemcitabine dose reduced with day 8 chemotherapy Cycle 5 gemcitabine/cisplatin 03/04/2019, gemcitabine reescalated to full dose Cycle 6 gemcitabine/cisplatin 03/25/2019 Cycle 7 gemcitabine/cisplatin 04/15/2019 Cycle 8 gemcitabine/cisplatin 05/06/2019 CT 05/18/2019-decreased size of multiple right hepatic masses 06/17/2019-hepatic arterial infusion pump placed, resection of hepatic artery/portal nodes, 1/7 portal nodes positive 07/10/2019-FUDR No. 1 07/23/2019 gemcitabine 800 mg/m added 08/06/2019-FUDR No. 2, 55 mg (dose reduced due to mild increase in bilirubin (, gemcitabine 800 mg/m and oxaliplatin 68 mg/m 08/20/2019-treatment continued, Decadron and pump continue due to mild increase in alkaline phosphatase 09/17/2019, FUDR No. 3 at 28 mg (dose reduced due to continued increase in alkaline phosphatase) 10/01/2019, CTs with continued response to therapy, decrease in liver disease 10/01/2019, therapy continued 10/15/2019, therapy continue with gemcitabine and oxaliplatin, FUDR restarted at 55 mg 11/12/2019-alkaline phosphatase elevated, FUDR held, Decadron given in hepatic infusion flush 11/26/2019 chemotherapy held secondary to elevated liver  enzymes 11/26/2019-CT with continued improvement in the hepatic tumor burden 12/10/2019-chemotherapy  restarted with gemcitabine/oxaliplatin 12/24/2019 chemotherapy continued, FUDR 28 mg 02/04/2020-oxaliplatin held secondary to worsening neuropathy 03/02/2020-CTs-stable disease Treatment continued with FUDR and gemcitabine CTs 05/26/2020-unchanged treated lesions in the liver, unchanged left upper lobe pulmonary nodules, no evidence of disease progression 05/26/2020-recommendation to continue current therapy with gemcitabine plus FUDR (as labs allow) 06/09/2020-FUDR held 06/23/2020-FUDR held, continue gemcitabine, steroids given in HIA flush 07/21/2020 every 2-week Gemcitabine CTs at [facility] 08/18/2020-stable treated hepatic metastases, no evidence of new or enlarging metastatic disease Every 2-week gemcitabine continued 08/31/2020 gemcitabine, Udenyca MRI 09/16/2020 at [facility]-irregularly shaped 1.9 cm lesion in hepatic segment 5 favored to represent posttreatment fibrosis.  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  Progressively increasing intrahepatic ductal dilatation over the past several exams. ERCP at [facility] 10/03/2020-single severe localized biliary stricture was found in the common hepatic duct.  The stricture was fibrotic.  The left and right hepatic ducts and all intrahepatic branches were moderately dilated.  The upper third of the main bile duct was successfully dilated.  Plastic biliary stent was placed into the common bile duct.  Brushings were obtained in the upper third of the main bile duct-no evidence of malignancy. PET CT at [facility] 10/20/2020-decreased intrahepatic duct dilation, previously described segment 5 lesion without increased FDG activity, hypermetabolic left thyroid lesion CTs at [facility] 12/26/2020-stable tiny bilateral pulmonary nodules, stable mild Intermatic biliary ductal dilatation, mild thickening of the common bile duct, stable  subcentimeter low-attenuation liver lesion ERCP 01/12/2021-severe localized complex stenoses in the common hepatic duct, hepatic bifurcation, left main hepatic duct, and right main hepatic duct.  A plastic stent was placed into the right hepatic duct CTs 03/29/2021-similar appearance of liver lesions and delayed enhancing mass at the tip of the right lobe of the liver.  Similar bilateral pulmonary nodules. CTs 06/28/2021-unchanged liver lesions, no new metastatic disease, stable lung nodules.  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 ERCP with right and left hepatic stent exchange 07/27/2021 ERCP 10/26/2021-single biliary stricture in the common hepatic duct, left and right main hepatic duct dilated, plastic stents placed in the left and right hepatic ducts CTs 09/28/2021-no evidence of progressive metastatic disease CTs 01/11/2022-increased size of segment 5 hepatic lesion, no evidence of distant metastatic disease, stable sub-5 mm pulmonary nodules, hepatic artery infusion pump terminates in a fluid collection anterior to the hepatic artery MRI liver 01/26/2022-interval increase in size of segment 5 hepatic lesion, new areas of hyperenhancement in the central liver adjacent to the hepatic hilum, decrease in intrahepatic biliary ductal dilatation with similar short segment stricture at the intrahepatic biliary confluence 01/26/2022-injection of HIA pump showed focal collection of contrast at the HIA catheter tip consistent with extravasation-hepatic pump turned off Radiation to liver 02/13/2022 - 02/19/2022, 5000 cGy CTs 05/21/2022-stable hepatic segment 5 lesion, no evidence of progressive metastatic disease ERCP 05/22/2022-single moderate stricture in the common hepatic duct, left and right hepatic ducts and  all intrahepatic branches were mildly dilated, left and right hepatic duct stents placed, main bile duct brushing cytology-no evidence of malignancy Family history of multiple cancers eluding breast, pancreatic, melanoma, appendiceal cancer, lung cancer  Osteoporosis Neutropenia secondary to chemotherapy, Udenyca added with day 8 cycle 4 Hypermetabolic left thyroid nodule on PET 10/20/2020; thyroid nodule and left neck lymph node biopsy 12/12/2020-both diagnostic of malignancy, papillary thyroid  carcinoma; thyroidectomy 03/14/2021 with pathology revealing papillary thyroid cancer with 1 of 8 nodes positive CT neck 09/28/2021-status post thyroidectomy without evidence of residual or recurrent disease Remnant ablation and adjuvant thyroid cancer therapy with oral I-131 11/13/2021     Disposition: Angela Fleming appears stable.  The restaging evaluation at [facility] last week reveals no evidence of disease progression.  The plan is to continue observation.  She will return for a Port-A-Cath flush 07/10/2022.  She will return for an office visit and Port-A-Cath flush in late July.  She is scheduled for a restaging evaluation at [facility] on 08/30/2022.  The liver enzymes remain mildly elevated.  Thornton Papas, MD  05/29/2022  11:56 AM

## 2022-05-29 NOTE — Patient Instructions (Signed)

## 2022-06-04 ENCOUNTER — Ambulatory Visit: Payer: Medicare Other | Admitting: Oncology

## 2022-07-05 ENCOUNTER — Encounter: Payer: Self-pay | Admitting: Oncology

## 2022-07-05 ENCOUNTER — Other Ambulatory Visit: Payer: Self-pay | Admitting: Internal Medicine

## 2022-07-05 DIAGNOSIS — Z0000 Encounter for general adult medical examination without abnormal findings: Secondary | ICD-10-CM

## 2022-07-06 ENCOUNTER — Other Ambulatory Visit: Payer: Self-pay | Admitting: *Deleted

## 2022-07-06 DIAGNOSIS — E559 Vitamin D deficiency, unspecified: Secondary | ICD-10-CM

## 2022-07-06 DIAGNOSIS — C221 Intrahepatic bile duct carcinoma: Secondary | ICD-10-CM

## 2022-07-10 ENCOUNTER — Other Ambulatory Visit (HOSPITAL_BASED_OUTPATIENT_CLINIC_OR_DEPARTMENT_OTHER): Payer: Self-pay | Admitting: Internal Medicine

## 2022-07-10 ENCOUNTER — Inpatient Hospital Stay: Payer: Medicare Other

## 2022-07-10 DIAGNOSIS — Z1231 Encounter for screening mammogram for malignant neoplasm of breast: Secondary | ICD-10-CM

## 2022-07-12 ENCOUNTER — Encounter: Payer: Self-pay | Admitting: Oncology

## 2022-07-12 ENCOUNTER — Telehealth: Payer: Self-pay

## 2022-07-12 NOTE — Telephone Encounter (Signed)
The patient reports experiencing pain on the right side of her abdomen and is requesting an appointment to be seen by a provider.

## 2022-07-13 ENCOUNTER — Inpatient Hospital Stay (HOSPITAL_BASED_OUTPATIENT_CLINIC_OR_DEPARTMENT_OTHER): Payer: Medicare Other | Admitting: Nurse Practitioner

## 2022-07-13 ENCOUNTER — Inpatient Hospital Stay: Payer: Medicare Other

## 2022-07-13 ENCOUNTER — Inpatient Hospital Stay: Payer: Medicare Other | Attending: Oncology

## 2022-07-13 ENCOUNTER — Encounter: Payer: Self-pay | Admitting: Nurse Practitioner

## 2022-07-13 ENCOUNTER — Ambulatory Visit (HOSPITAL_BASED_OUTPATIENT_CLINIC_OR_DEPARTMENT_OTHER)
Admission: RE | Admit: 2022-07-13 | Discharge: 2022-07-13 | Disposition: A | Discharge status: Home / Self Care | Payer: Medicare Other | Source: Ambulatory Visit | Attending: Internal Medicine | Admitting: Internal Medicine

## 2022-07-13 ENCOUNTER — Ambulatory Visit (HOSPITAL_BASED_OUTPATIENT_CLINIC_OR_DEPARTMENT_OTHER)
Admission: RE | Admit: 2022-07-13 | Discharge: 2022-07-13 | Disposition: A | Discharge status: Home / Self Care | Payer: Medicare Other | Source: Ambulatory Visit | Attending: Nurse Practitioner | Admitting: Nurse Practitioner

## 2022-07-13 VITALS — BP 110/69 | HR 69 | Temp 98.2°F | Resp 18 | Wt 122.4 lb

## 2022-07-13 DIAGNOSIS — Z8505 Personal history of malignant neoplasm of liver: Secondary | ICD-10-CM | POA: Diagnosis present

## 2022-07-13 DIAGNOSIS — C221 Intrahepatic bile duct carcinoma: Secondary | ICD-10-CM

## 2022-07-13 DIAGNOSIS — Z808 Family history of malignant neoplasm of other organs or systems: Secondary | ICD-10-CM | POA: Diagnosis not present

## 2022-07-13 DIAGNOSIS — M810 Age-related osteoporosis without current pathological fracture: Secondary | ICD-10-CM | POA: Insufficient documentation

## 2022-07-13 DIAGNOSIS — R0781 Pleurodynia: Secondary | ICD-10-CM

## 2022-07-13 DIAGNOSIS — D701 Agranulocytosis secondary to cancer chemotherapy: Secondary | ICD-10-CM | POA: Diagnosis not present

## 2022-07-13 DIAGNOSIS — Z1231 Encounter for screening mammogram for malignant neoplasm of breast: Secondary | ICD-10-CM | POA: Diagnosis present

## 2022-07-13 DIAGNOSIS — Z800 Family history of malignant neoplasm of digestive organs: Secondary | ICD-10-CM | POA: Diagnosis not present

## 2022-07-13 DIAGNOSIS — Z801 Family history of malignant neoplasm of trachea, bronchus and lung: Secondary | ICD-10-CM | POA: Insufficient documentation

## 2022-07-13 DIAGNOSIS — R0789 Other chest pain: Secondary | ICD-10-CM | POA: Insufficient documentation

## 2022-07-13 DIAGNOSIS — E559 Vitamin D deficiency, unspecified: Secondary | ICD-10-CM

## 2022-07-13 DIAGNOSIS — Z85850 Personal history of malignant neoplasm of thyroid: Secondary | ICD-10-CM | POA: Diagnosis present

## 2022-07-13 LAB — CMP (CANCER CENTER ONLY)
ALT: 26 U/L (ref 0–44)
AST: 27 U/L (ref 15–41)
Albumin: 4.2 g/dL (ref 3.5–5.0)
Alkaline Phosphatase: 126 U/L (ref 38–126)
Anion gap: 8 (ref 5–15)
BUN: 15 mg/dL (ref 8–23)
CO2: 26 mmol/L (ref 22–32)
Calcium: 8.5 mg/dL — ABNORMAL LOW (ref 8.9–10.3)
Chloride: 106 mmol/L (ref 98–111)
Creatinine: 0.76 mg/dL (ref 0.44–1.00)
GFR, Estimated: >60 mL/min (ref >60)
Glucose, Bld: 100 mg/dL — ABNORMAL HIGH (ref 70–99)
Potassium: 4.3 mmol/L (ref 3.5–5.1)
Sodium: 140 mmol/L (ref 135–145)
Total Bilirubin: 0.4 mg/dL (ref 0.3–1.2)
Total Protein: 6.3 g/dL — ABNORMAL LOW (ref 6.5–8.1)

## 2022-07-13 LAB — VITAMIN D 25 HYDROXY (VIT D DEFICIENCY, FRACTURES): Vit D, 25-Hydroxy: 27.46 ng/mL — ABNORMAL LOW (ref 30–100)

## 2022-07-13 NOTE — Progress Notes (Signed)
[facility] OFFICE PROGRESS NOTE   Diagnosis: Cholangiocarcinoma  INTERVAL HISTORY:   [name] returns prior to scheduled follow-up for evaluation of right-sided abdominal pain.  She reports very active doing yard work, gardening and then cleaning with HD machine beginning about 10 days ago.  For the past 5 days she has had "terrible" intermittent right-sided abdominal pain.  She feels she has injured a rib.  She is taking Advil 200 mg periodically with temporary relief.  No constipation or diarrhea.  She had an episode of nausea earlier today when she was experiencing pain while walking around [employer].    Objective:  Vital signs in last 24 hours:  Blood pressure 110/69, pulse 69, temperature 98.2 F (36.8 C), temperature source Oral, resp. rate 18, weight 122 lb 6.4 oz (55.5 kg), SpO2 100 %.    Lymphatics: No palpable cervical, supraclavicular, axillary lymph nodes. Resp: Lungs clear bilaterally. Cardio: Regular rate and rhythm. GI: No hepatomegaly.  No mass.  Infusion pump left abdomen. Vascular: No leg edema. Musculoskeletal: Marked tenderness right lower anterior ribs/chest wall. Skin: Area of erythema right lateral lower chest wall.   Lab Results:  Lab Results  Component Value Date   WBC 5.0 10/07/2020   HGB 12.2 10/07/2020   HCT 37.9 10/07/2020   MCV 90.2 10/07/2020   PLT 185 10/07/2020   NEUTROABS 3.0 10/07/2020    Imaging:  No results found.  Medications: I have reviewed the patient's current medications.  Assessment/Plan: Intrahepatic cholangiocarcinoma Abdominal ultrasound 10/08/2018-heterogenous right hepatic mass MRI abdomen 10/14/2018-2 enhancing right liver lesions, 4.8 x 4.5 x 4.1 cm, more superior lesion measured 3 x 2 x 2.5 cm, multiple additional subcentimeter T2 hyperintense lesions-too small to characterize. CT chest 11/04/2018-6 mm left upper lobe groundglass nodule, no evidence of metastatic disease in the chest Biopsy of right liver  lesion at [facility] 11/10/2018-moderately differentiated adenocarcinoma consistent with cholangiocarcinoma; IDH1 alteration; MSI stable; mismatch repair status proficient; tumor mutational burden low, 3; PD-L1 negative CTs at [facility] 12/12/2018-right hepatic lobe masses, satellite lesions adjacent to the dominant mass, prominent periportal and aortocaval lymph nodes prominent left supraclavicular node Cycle 1 gemcitabine/cisplatin at [facility] on 12/12/2018 Cycle 2 gemcitabine/cisplatin 01/02/2019, 01/09/2019 Cycle 3 gemcitabine/cisplatin 01/23/2019 CTs at [facility] 02/02/2019-decrease in size of multiple hepatic masses, decreased size of prominent periportal and aortocaval nodes, unchanged prominent left supraclavicular node Cycle 4 gemcitabine/cisplatin 02/11/2019, Udenyca added and gemcitabine dose reduced with day 8 chemotherapy Cycle 5 gemcitabine/cisplatin 03/04/2019, gemcitabine reescalated to full dose Cycle 6 gemcitabine/cisplatin 03/25/2019 Cycle 7 gemcitabine/cisplatin 04/15/2019 Cycle 8 gemcitabine/cisplatin 05/06/2019 CT 05/18/2019-decreased size of multiple right hepatic masses 06/17/2019-hepatic arterial infusion pump placed, resection of hepatic artery/portal nodes, 1/7 portal nodes positive 07/10/2019-FUDR No. 1 07/23/2019 gemcitabine 800 mg/m added 08/06/2019-FUDR No. 2, 55 mg (dose reduced due to mild increase in bilirubin (, gemcitabine 800 mg/m and oxaliplatin 68 mg/m 08/20/2019-treatment continued, Decadron and pump continue due to mild increase in alkaline phosphatase 09/17/2019, FUDR No. 3 at 28 mg (dose reduced due to continued increase in alkaline phosphatase) 10/01/2019, CTs with continued response to therapy, decrease in liver disease 10/01/2019, therapy continued 10/15/2019, therapy continue with gemcitabine and oxaliplatin, FUDR restarted at 55 mg 11/12/2019-alkaline phosphatase elevated, FUDR held, Decadron given in hepatic infusion flush 11/26/2019 chemotherapy held secondary to elevated  liver enzymes 11/26/2019-CT with continued improvement in the hepatic tumor burden 12/10/2019-chemotherapy restarted with gemcitabine/oxaliplatin 12/24/2019 chemotherapy continued, FUDR 28 mg 02/04/2020-oxaliplatin held secondary to worsening neuropathy 03/02/2020-CTs-stable disease Treatment continued with FUDR and gemcitabine CTs  05/26/2020-unchanged treated lesions in the liver, unchanged left upper lobe pulmonary nodules, no evidence of disease progression 05/26/2020-recommendation to continue current therapy with gemcitabine plus FUDR (as labs allow) 06/09/2020-FUDR held 06/23/2020-FUDR held, continue gemcitabine, steroids given in HIA flush 07/21/2020 every 2-week Gemcitabine CTs at [facility] 08/18/2020-stable treated hepatic metastases, no evidence of new or enlarging metastatic disease Every 2-week gemcitabine continued 08/31/2020 gemcitabine, Udenyca MRI 09/16/2020 at [facility]-irregularly shaped 1.9 cm lesion in hepatic segment 5 favored to represent posttreatment fibrosis.  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  Progressively increasing intrahepatic ductal dilatation over the past several exams. ERCP at [facility] 10/03/2020-single severe localized biliary stricture was found in the common hepatic duct.  The stricture was fibrotic.  The left and right hepatic ducts and all intrahepatic branches were moderately dilated.  The upper third of the main bile duct was successfully dilated.  Plastic biliary stent was placed into the common bile duct.  Brushings were obtained in the upper third of the main bile duct-no evidence of malignancy. PET CT at [facility] 10/20/2020-decreased intrahepatic duct dilation, previously described segment 5 lesion without increased FDG activity, hypermetabolic left thyroid lesion CTs at [facility] 12/26/2020-stable tiny bilateral pulmonary nodules, stable mild Intermatic biliary ductal dilatation, mild thickening of the common bile duct,  stable subcentimeter low-attenuation liver lesion ERCP 01/12/2021-severe localized complex stenoses in the common hepatic duct, hepatic bifurcation, left main hepatic duct, and right main hepatic duct.  A plastic stent was placed into the right hepatic duct CTs 03/29/2021-similar appearance of liver lesions and delayed enhancing mass at the tip of the right lobe of the liver.  Similar bilateral pulmonary nodules. CTs 06/28/2021-unchanged liver lesions, no new metastatic disease, stable lung nodules.  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 ERCP with right and left hepatic stent exchange 07/27/2021 ERCP 10/26/2021-single biliary stricture in the common hepatic duct, left and right main hepatic duct dilated, plastic stents placed in the left and right hepatic ducts CTs 09/28/2021-no evidence of progressive metastatic disease CTs 01/11/2022-increased size of segment 5 hepatic lesion, no evidence of distant metastatic disease, stable sub-5 mm pulmonary nodules, hepatic artery infusion pump terminates in a fluid collection anterior to the hepatic artery MRI liver 01/26/2022-interval increase in size of segment 5 hepatic lesion, new areas of hyperenhancement in the central liver adjacent to the hepatic hilum, decrease in intrahepatic biliary ductal dilatation with similar short segment stricture at the intrahepatic biliary confluence 01/26/2022-injection of HIA pump showed focal collection of contrast at the HIA catheter tip consistent with extravasation-hepatic pump turned off Radiation to liver 02/13/2022 - 02/19/2022, 5000 cGy CTs 05/21/2022-stable hepatic segment 5 lesion, no evidence of progressive metastatic disease ERCP 05/22/2022-single moderate stricture in the common hepatic duct, left and right hepatic  ducts and all intrahepatic branches were mildly dilated, left and right hepatic duct stents placed, main bile duct brushing cytology-no evidence of malignancy Family history of multiple cancers eluding breast, pancreatic, melanoma, appendiceal cancer, lung cancer   Osteoporosis Neutropenia secondary to chemotherapy, Udenyca added with day 8 cycle 4 Hypermetabolic left thyroid nodule on PET 10/20/2020; thyroid nodule and left neck lymph node biopsy 12/12/2020-both diagnostic of malignancy, papillary thyroid carcinoma; thyroidectomy 03/14/2021 with pathology revealing papillary thyroid cancer with 1 of 8 nodes positive CT neck 09/28/2021-status post thyroidectomy without evidence of  residual or recurrent disease Remnant ablation and adjuvant thyroid cancer therapy with oral I-131 11/13/2021  Disposition: Angela Fleming appears stable.  She presents with an approximate 5-day history of intermittent pain/tenderness at right lower anterior ribs/chest wall.  This follows fairly strenuous activity about 10 days ago.  Right rib film obtained, no fracture.  She may have strained a muscle.  If the pain/tenderness persists we will refer her for a noncontrast CT scan through this area.  We also discussed the possibility of prodromal phase of herpes zoster.  She understands to contact the office if she develops a rash.  She will return for follow-up as scheduled.  Patient seen with Dr. Truett Perna.  Lonna Cobb ANP/GNP-BC   07/13/2022  3:11 PM This was a shared visit with Lonna Cobb.  Angela Fleming was interviewed and examined.  She presents today for an unscheduled visit.  She has discomfort at the right low anterior chest wall.  The pain is most likely related to a benign musculoskeletal condition.  There is faint erythema at the right chest wall (not present earlier today).  It is possible she has an early zoster infection.  She will call for development of a rash.  We will plan for a CT of the chest if she has persistent  pain.  Was present for greater than 50% of today's visit but I performed medical decision making.  Mancel Bale, MD

## 2022-07-13 NOTE — Patient Instructions (Signed)

## 2022-07-19 ENCOUNTER — Encounter: Payer: Self-pay | Admitting: Nurse Practitioner

## 2022-09-04 ENCOUNTER — Inpatient Hospital Stay: Payer: Medicare Other

## 2022-09-04 ENCOUNTER — Encounter: Payer: Self-pay | Admitting: Oncology

## 2022-09-04 ENCOUNTER — Inpatient Hospital Stay: Payer: Medicare Other | Admitting: Oncology

## 2022-09-07 ENCOUNTER — Inpatient Hospital Stay: Payer: Medicare Other | Admitting: Oncology

## 2022-10-22 ENCOUNTER — Encounter: Payer: Self-pay | Admitting: Oncology

## 2022-10-23 ENCOUNTER — Inpatient Hospital Stay: Payer: Medicare Other

## 2022-10-23 ENCOUNTER — Inpatient Hospital Stay: Payer: Medicare Other | Admitting: Oncology

## 2022-11-07 ENCOUNTER — Encounter: Payer: Self-pay | Admitting: Oncology

## 2022-11-07 ENCOUNTER — Inpatient Hospital Stay (HOSPITAL_BASED_OUTPATIENT_CLINIC_OR_DEPARTMENT_OTHER): Payer: Medicare Other | Admitting: Oncology

## 2022-11-07 ENCOUNTER — Inpatient Hospital Stay: Payer: Medicare Other

## 2022-11-07 ENCOUNTER — Inpatient Hospital Stay: Payer: Medicare Other | Attending: Hematology & Oncology

## 2022-11-07 VITALS — BP 124/70 | HR 63 | Temp 97.9°F | Resp 18 | Ht 64.0 in | Wt 125.7 lb

## 2022-11-07 DIAGNOSIS — D701 Agranulocytosis secondary to cancer chemotherapy: Secondary | ICD-10-CM | POA: Diagnosis not present

## 2022-11-07 DIAGNOSIS — C221 Intrahepatic bile duct carcinoma: Secondary | ICD-10-CM | POA: Diagnosis not present

## 2022-11-07 DIAGNOSIS — Z808 Family history of malignant neoplasm of other organs or systems: Secondary | ICD-10-CM | POA: Insufficient documentation

## 2022-11-07 DIAGNOSIS — M810 Age-related osteoporosis without current pathological fracture: Secondary | ICD-10-CM | POA: Insufficient documentation

## 2022-11-07 DIAGNOSIS — Z803 Family history of malignant neoplasm of breast: Secondary | ICD-10-CM | POA: Insufficient documentation

## 2022-11-07 DIAGNOSIS — Z800 Family history of malignant neoplasm of digestive organs: Secondary | ICD-10-CM | POA: Insufficient documentation

## 2022-11-07 DIAGNOSIS — Z801 Family history of malignant neoplasm of trachea, bronchus and lung: Secondary | ICD-10-CM | POA: Insufficient documentation

## 2022-11-07 DIAGNOSIS — Z85850 Personal history of malignant neoplasm of thyroid: Secondary | ICD-10-CM | POA: Insufficient documentation

## 2022-11-07 LAB — CMP (CANCER CENTER ONLY)
ALT: 24 U/L (ref 0–44)
AST: 25 U/L (ref 15–41)
Albumin: 4.2 g/dL (ref 3.5–5.0)
Alkaline Phosphatase: 109 U/L (ref 38–126)
Anion gap: 7 (ref 5–15)
BUN: 15 mg/dL (ref 8–23)
CO2: 26 mmol/L (ref 22–32)
Calcium: 8.7 mg/dL — ABNORMAL LOW (ref 8.9–10.3)
Chloride: 107 mmol/L (ref 98–111)
Creatinine: 0.63 mg/dL (ref 0.44–1.00)
GFR, Estimated: >60 mL/min (ref >60)
Glucose, Bld: 111 mg/dL — ABNORMAL HIGH (ref 70–99)
Potassium: 3.9 mmol/L (ref 3.5–5.1)
Sodium: 140 mmol/L (ref 135–145)
Total Bilirubin: 0.5 mg/dL (ref 0.3–1.2)
Total Protein: 6.4 g/dL — ABNORMAL LOW (ref 6.5–8.1)

## 2022-11-07 NOTE — Progress Notes (Signed)
[facility] OFFICE PROGRESS NOTE   Diagnosis: Cholangiocarcinoma  INTERVAL HISTORY:   Angela Fleming returns as scheduled.  She remains off of specific treatment for cholangiocarcinoma.  She underwent a restaging CT evaluation at [facility] on 08/30/2022.  A slight decrease in the segment 5 hepatic lesion was noted.  No evidence of progressive metastatic disease with a stable 0.5 cm left upper lobe groundglass nodule.  She underwent bile duct stent exchange was performed on 10/09/2022.  Angela Fleming feels well.  Good appetite and energy level.  No abdominal pain.  Objective:  Vital signs in last 24 hours:  Blood pressure 124/70, pulse 63, temperature 97.9 F (36.6 C), temperature source Temporal, resp. rate 18, height 5\' 4"  (1.626 m), weight 125 lb 11.2 oz (57 kg), SpO2 98%.    Resp: Lungs clear bilaterally Cardio: Regular rate and rhythm GI: No hepatosplenomegaly, no mass, left abdomen hepatic infusion pump Vascular: Leg edema   Portacath/PICC-without erythema  Lab Results:  Lab Results  Component Value Date   WBC 5.0 10/07/2020   HGB 12.2 10/07/2020   HCT 37.9 10/07/2020   MCV 90.2 10/07/2020   PLT 185 10/07/2020   NEUTROABS 3.0 10/07/2020    CMP  Lab Results  Component Value Date   NA 140 11/07/2022   K 3.9 11/07/2022   CL 107 11/07/2022   CO2 26 11/07/2022   GLUCOSE 111 (H) 11/07/2022   BUN 15 11/07/2022   CREATININE 0.63 11/07/2022   CALCIUM 8.7 (L) 11/07/2022   PROT 6.4 (L) 11/07/2022   ALBUMIN 4.2 11/07/2022   AST 25 11/07/2022   ALT 24 11/07/2022   ALKPHOS 109 11/07/2022   BILITOT 0.5 11/07/2022   GFRNONAA >60 11/07/2022   GFRAA >60 05/13/2019    Lab Results  Component Value Date   CEA1 1.02 01/02/2019   [id number] 5 01/02/2019     Medications: I have reviewed the patient's current medications.   Assessment/Plan: Intrahepatic cholangiocarcinoma Abdominal ultrasound 10/08/2018-heterogenous right hepatic mass MRI abdomen 10/14/2018-2  enhancing right liver lesions, 4.8 x 4.5 x 4.1 cm, more superior lesion measured 3 x 2 x 2.5 cm, multiple additional subcentimeter T2 hyperintense lesions-too small to characterize. CT chest 11/04/2018-6 mm left upper lobe groundglass nodule, no evidence of metastatic disease in the chest Biopsy of right liver lesion at [facility] 11/10/2018-moderately differentiated adenocarcinoma consistent with cholangiocarcinoma; IDH1 alteration; MSI stable; mismatch repair status proficient; tumor mutational burden low, 3; PD-L1 negative CTs at [facility] 12/12/2018-right hepatic lobe masses, satellite lesions adjacent to the dominant mass, prominent periportal and aortocaval lymph nodes prominent left supraclavicular node Cycle 1 gemcitabine/cisplatin at [facility] on 12/12/2018 Cycle 2 gemcitabine/cisplatin 01/02/2019, 01/09/2019 Cycle 3 gemcitabine/cisplatin 01/23/2019 CTs at [facility] 02/02/2019-decrease in size of multiple hepatic masses, decreased size of prominent periportal and aortocaval nodes, unchanged prominent left supraclavicular node Cycle 4 gemcitabine/cisplatin 02/11/2019, Udenyca added and gemcitabine dose reduced with day 8 chemotherapy Cycle 5 gemcitabine/cisplatin 03/04/2019, gemcitabine reescalated to full dose Cycle 6 gemcitabine/cisplatin 03/25/2019 Cycle 7 gemcitabine/cisplatin 04/15/2019 Cycle 8 gemcitabine/cisplatin 05/06/2019 CT 05/18/2019-decreased size of multiple right hepatic masses 06/17/2019-hepatic arterial infusion pump placed, resection of hepatic artery/portal nodes, 1/7 portal nodes positive 07/10/2019-FUDR No. 1 07/23/2019 gemcitabine 800 mg/m added 08/06/2019-FUDR No. 2, 55 mg (dose reduced due to mild increase in bilirubin (, gemcitabine 800 mg/m and oxaliplatin 68 mg/m 08/20/2019-treatment continued, Decadron and pump continue due to mild increase in alkaline phosphatase 09/17/2019, FUDR No. 3 at 28 mg (dose reduced due to continued increase in alkaline phosphatase) 10/01/2019, CTs  with  continued response to therapy, decrease in liver disease 10/01/2019, therapy continued 10/15/2019, therapy continue with gemcitabine and oxaliplatin, FUDR restarted at 55 mg 11/12/2019-alkaline phosphatase elevated, FUDR held, Decadron given in hepatic infusion flush 11/26/2019 chemotherapy held secondary to elevated liver enzymes 11/26/2019-CT with continued improvement in the hepatic tumor burden 12/10/2019-chemotherapy restarted with gemcitabine/oxaliplatin 12/24/2019 chemotherapy continued, FUDR 28 mg 02/04/2020-oxaliplatin held secondary to worsening neuropathy 03/02/2020-CTs-stable disease Treatment continued with FUDR and gemcitabine CTs 05/26/2020-unchanged treated lesions in the liver, unchanged left upper lobe pulmonary nodules, no evidence of disease progression 05/26/2020-recommendation to continue current therapy with gemcitabine plus FUDR (as labs allow) 06/09/2020-FUDR held 06/23/2020-FUDR held, continue gemcitabine, steroids given in HIA flush 07/21/2020 every 2-week Gemcitabine CTs at [facility] 08/18/2020-stable treated hepatic metastases, no evidence of new or enlarging metastatic disease Every 2-week gemcitabine continued 08/31/2020 gemcitabine, Udenyca MRI 09/16/2020 at [facility]-irregularly shaped 1.9 cm lesion in hepatic segment 5 favored to represent posttreatment fibrosis.  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  Progressively increasing intrahepatic ductal dilatation over the past several exams. ERCP at [facility] 10/03/2020-single severe localized biliary stricture was found in the common hepatic duct.  The stricture was fibrotic.  The left and right hepatic ducts and all intrahepatic branches were moderately dilated.  The upper third of the main bile duct was successfully dilated.  Plastic biliary stent was placed into the common bile duct.  Brushings were obtained in the upper third of the main bile duct-no evidence of malignancy. PET CT  at [facility] 10/20/2020-decreased intrahepatic duct dilation, previously described segment 5 lesion without increased FDG activity, hypermetabolic left thyroid lesion CTs at [facility] 12/26/2020-stable tiny bilateral pulmonary nodules, stable mild Intermatic biliary ductal dilatation, mild thickening of the common bile duct, stable subcentimeter low-attenuation liver lesion ERCP 01/12/2021-severe localized complex stenoses in the common hepatic duct, hepatic bifurcation, left main hepatic duct, and right main hepatic duct.  A plastic stent was placed into the right hepatic duct CTs 03/29/2021-similar appearance of liver lesions and delayed enhancing mass at the tip of the right lobe of the liver.  Similar bilateral pulmonary nodules. CTs 06/28/2021-unchanged liver lesions, no new metastatic disease, stable lung nodules.  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 ERCP with right and left hepatic stent exchange 07/27/2021 ERCP 10/26/2021-single biliary stricture in the common hepatic duct, left and right main hepatic duct dilated, plastic stents placed in the left and right hepatic ducts CTs 09/28/2021-no evidence of progressive metastatic disease CTs 01/11/2022-increased size of segment 5 hepatic lesion, no evidence of distant metastatic disease, stable sub-5 mm pulmonary nodules, hepatic artery infusion pump terminates in a fluid collection anterior to the hepatic artery MRI liver 01/26/2022-interval increase in size of segment 5 hepatic lesion, new areas of hyperenhancement in the central liver adjacent to the hepatic hilum, decrease in intrahepatic biliary ductal dilatation with similar short segment stricture at the intrahepatic biliary confluence 01/26/2022-injection of HIA pump showed focal collection of contrast  at the HIA catheter tip consistent with extravasation-hepatic pump turned off Radiation to liver 02/13/2022 - 02/19/2022, 5000 cGy CTs 05/21/2022-stable hepatic segment 5 lesion, no evidence of progressive metastatic disease ERCP 05/22/2022-single moderate stricture in the common hepatic duct, left and right hepatic ducts and all intrahepatic branches were mildly dilated, left and right hepatic duct stents placed, main  bile duct brushing cytology-no evidence of malignancy CT 08/30/2022-slight decrease in size of segment 5 hepatic lesion, no evidence of progressive disease, stable 0.5 cm left upper lobe groundglass nodule ERCP with biliary stent exchange at [facility] 10/09/2022 Family history of multiple cancers eluding breast, pancreatic, melanoma, appendiceal cancer, lung cancer   Osteoporosis Neutropenia secondary to chemotherapy, Udenyca added with day 8 cycle 4 Hypermetabolic left thyroid nodule on PET 10/20/2020; thyroid nodule and left neck lymph node biopsy 12/12/2020-both diagnostic of malignancy, papillary thyroid carcinoma; thyroidectomy 03/14/2021 with pathology revealing papillary thyroid cancer with 1 of 8 nodes positive CT neck 09/28/2021-status post thyroidectomy without evidence of residual or recurrent disease Remnant ablation and adjuvant thyroid cancer therapy with oral I-131 11/13/2021    Disposition: Angela Fleming appears unchanged.  There is no clinical evidence for progression of the cholangiocarcinoma.  She will continue ERCP exchange of the bile duct stents at [facility].  She is scheduled undergo restaging CTs at [facility] in late October.  She will see Dr. Rod Mae in January.  She will return for an office visit and Port-A-Cath flush in March.  We are available to see her sooner as needed.  Thornton Papas, MD  11/07/2022  9:06 AM

## 2022-12-27 ENCOUNTER — Encounter: Payer: Self-pay | Admitting: Oncology

## 2023-01-04 ENCOUNTER — Encounter: Payer: Self-pay | Admitting: Oncology

## 2023-02-28 ENCOUNTER — Encounter: Payer: Self-pay | Admitting: Oncology

## 2023-03-04 ENCOUNTER — Inpatient Hospital Stay: Payer: Medicare Other | Attending: Oncology | Admitting: Oncology

## 2023-03-04 VITALS — BP 120/69 | HR 74 | Temp 97.9°F | Resp 18 | Ht 64.0 in | Wt 120.8 lb

## 2023-03-04 DIAGNOSIS — Z923 Personal history of irradiation: Secondary | ICD-10-CM | POA: Insufficient documentation

## 2023-03-04 DIAGNOSIS — Z9221 Personal history of antineoplastic chemotherapy: Secondary | ICD-10-CM | POA: Insufficient documentation

## 2023-03-04 DIAGNOSIS — C221 Intrahepatic bile duct carcinoma: Secondary | ICD-10-CM | POA: Diagnosis present

## 2023-03-04 NOTE — Progress Notes (Signed)
[facility] OFFICE PROGRESS NOTE   Diagnosis: Cholangiocarcinoma   INTERVAL HISTORY: Angela Fleming returns prior to a scheduled visit.  She underwent removal of the hepatic infusion pump on 12/10/2022.  Restaging CTs at [facility] on 12/13/2022 revealed a new segment 5 lesion.  She underwent radiation to this lesion getting 01/02/2023 and completed 01/16/2023.  She reports developing malaise following radiation.  She feels well at present.  Good appetite. She underwent an ERCP and exchange of the bile duct stents on 01/30/2023.  She is scheduled undergo a restaging CT evaluation and follow-up with Dr. Rod Mae 03/14/2022.  Objective:  Vital signs in last 24 hours:  Blood pressure 120/69, pulse 74, temperature 97.9 F (36.6 C), temperature source Temporal, resp. rate 18, height 5\' 4"  (1.626 m), weight 120 lb 12.8 oz (54.8 kg), SpO2 100%.    Lymphatics: No cervical or supraclavicular nodes Resp: Lungs clear bilaterally Cardio: Regular rate and rhythm GI: No hepatosplenomegaly, no mass, nontender Vascular: No leg edema  Portacath/PICC-without erythema  Lab Results:  Lab Results  Component Value Date   WBC 5.0 10/07/2020   HGB 12.2 10/07/2020   HCT 37.9 10/07/2020   MCV 90.2 10/07/2020   PLT 185 10/07/2020   NEUTROABS 3.0 10/07/2020    CMP  Lab Results  Component Value Date   NA 140 11/07/2022   K 3.9 11/07/2022   CL 107 11/07/2022   CO2 26 11/07/2022   GLUCOSE 111 (H) 11/07/2022   BUN 15 11/07/2022   CREATININE 0.63 11/07/2022   CALCIUM 8.7 (L) 11/07/2022   PROT 6.4 (L) 11/07/2022   ALBUMIN 4.2 11/07/2022   AST 25 11/07/2022   ALT 24 11/07/2022   ALKPHOS 109 11/07/2022   BILITOT 0.5 11/07/2022   GFRNONAA >60 11/07/2022   GFRAA >60 05/13/2019    Lab Results  Component Value Date   CEA1 1.02 01/02/2019   [id number] 5 01/02/2019    Medications: I have reviewed the patient's current medications.   Assessment/Plan: Intrahepatic cholangiocarcinoma Abdominal  ultrasound 10/08/2018-heterogenous right hepatic mass MRI abdomen 10/14/2018-2 enhancing right liver lesions, 4.8 x 4.5 x 4.1 cm, more superior lesion measured 3 x 2 x 2.5 cm, multiple additional subcentimeter T2 hyperintense lesions-too small to characterize. CT chest 11/04/2018-6 mm left upper lobe groundglass nodule, no evidence of metastatic disease in the chest Biopsy of right liver lesion at [facility] 11/10/2018-moderately differentiated adenocarcinoma consistent with cholangiocarcinoma; IDH1 alteration; MSI stable; mismatch repair status proficient; tumor mutational burden low, 3; PD-L1 negative CTs at [facility] 12/12/2018-right hepatic lobe masses, satellite lesions adjacent to the dominant mass, prominent periportal and aortocaval lymph nodes prominent left supraclavicular node Cycle 1 gemcitabine/cisplatin at [facility] on 12/12/2018 Cycle 2 gemcitabine/cisplatin 01/02/2019, 01/09/2019 Cycle 3 gemcitabine/cisplatin 01/23/2019 CTs at [facility] 02/02/2019-decrease in size of multiple hepatic masses, decreased size of prominent periportal and aortocaval nodes, unchanged prominent left supraclavicular node Cycle 4 gemcitabine/cisplatin 02/11/2019, Udenyca added and gemcitabine dose reduced with day 8 chemotherapy Cycle 5 gemcitabine/cisplatin 03/04/2019, gemcitabine reescalated to full dose Cycle 6 gemcitabine/cisplatin 03/25/2019 Cycle 7 gemcitabine/cisplatin 04/15/2019 Cycle 8 gemcitabine/cisplatin 05/06/2019 CT 05/18/2019-decreased size of multiple right hepatic masses 06/17/2019-hepatic arterial infusion pump placed, resection of hepatic artery/portal nodes, 1/7 portal nodes positive 07/10/2019-FUDR No. 1 07/23/2019 gemcitabine 800 mg/m added 08/06/2019-FUDR No. 2, 55 mg (dose reduced due to mild increase in bilirubin (, gemcitabine 800 mg/m and oxaliplatin 68 mg/m 08/20/2019-treatment continued, Decadron and pump continue due to mild increase in alkaline phosphatase 09/17/2019, FUDR No. 3 at 28 mg (dose reduced due  to continued increase in alkaline phosphatase) 10/01/2019, CTs with continued response to therapy, decrease in liver disease 10/01/2019, therapy continued 10/15/2019, therapy continue with gemcitabine and oxaliplatin, FUDR restarted at 55 mg 11/12/2019-alkaline phosphatase elevated, FUDR held, Decadron given in hepatic infusion flush 11/26/2019 chemotherapy held secondary to elevated liver enzymes 11/26/2019-CT with continued improvement in the hepatic tumor burden 12/10/2019-chemotherapy restarted with gemcitabine/oxaliplatin 12/24/2019 chemotherapy continued, FUDR 28 mg 02/04/2020-oxaliplatin held secondary to worsening neuropathy 03/02/2020-CTs-stable disease Treatment continued with FUDR and gemcitabine CTs 05/26/2020-unchanged treated lesions in the liver, unchanged left upper lobe pulmonary nodules, no evidence of disease progression 05/26/2020-recommendation to continue current therapy with gemcitabine plus FUDR (as labs allow) 06/09/2020-FUDR held 06/23/2020-FUDR held, continue gemcitabine, steroids given in HIA flush 07/21/2020 every 2-week Gemcitabine CTs at [facility] 08/18/2020-stable treated hepatic metastases, no evidence of new or enlarging metastatic disease Every 2-week gemcitabine continued 08/31/2020 gemcitabine, Udenyca MRI 09/16/2020 at [facility]-irregularly shaped 1.9 cm lesion in hepatic segment 5 favored to represent posttreatment fibrosis.  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  Progressively increasing intrahepatic ductal dilatation over the past several exams. ERCP at [facility] 10/03/2020-single severe localized biliary stricture was found in the common hepatic duct.  The stricture was fibrotic.  The left and right hepatic ducts and all intrahepatic branches were moderately dilated.  The upper third of the main bile duct was successfully dilated.  Plastic biliary stent was placed into the common bile duct.  Brushings were obtained in the  upper third of the main bile duct-no evidence of malignancy. PET CT at [facility] 10/20/2020-decreased intrahepatic duct dilation, previously described segment 5 lesion without increased FDG activity, hypermetabolic left thyroid lesion CTs at [facility] 12/26/2020-stable tiny bilateral pulmonary nodules, stable mild Intermatic biliary ductal dilatation, mild thickening of the common bile duct, stable subcentimeter low-attenuation liver lesion ERCP 01/12/2021-severe localized complex stenoses in the common hepatic duct, hepatic bifurcation, left main hepatic duct, and right main hepatic duct.  A plastic stent was placed into the right hepatic duct CTs 03/29/2021-similar appearance of liver lesions and delayed enhancing mass at the tip of the right lobe of the liver.  Similar bilateral pulmonary nodules. CTs 06/28/2021-unchanged liver lesions, no new metastatic disease, stable lung nodules.  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 ERCP with right and left hepatic stent exchange 07/27/2021 ERCP 10/26/2021-single biliary stricture in the common hepatic duct, left and right main hepatic duct dilated, plastic stents placed in the left and right hepatic ducts CTs 09/28/2021-no evidence of progressive metastatic disease CTs 01/11/2022-increased size of segment 5 hepatic lesion, no evidence of distant metastatic disease, stable sub-5 mm pulmonary nodules, hepatic artery infusion pump terminates in a fluid collection anterior to the hepatic artery MRI liver 01/26/2022-interval increase in size of segment 5 hepatic lesion, new areas of hyperenhancement in the central liver adjacent to the hepatic hilum, decrease in intrahepatic biliary ductal dilatation with similar short segment stricture at the intrahepatic biliary  confluence 01/26/2022-injection of HIA pump showed focal collection of contrast at the HIA catheter tip consistent with extravasation-hepatic pump turned off Radiation to liver 02/13/2022 - 02/19/2022, 5000 cGy CTs 05/21/2022-stable hepatic segment 5 lesion, no evidence of progressive metastatic disease ERCP 05/22/2022-single moderate stricture in the common hepatic duct, left and right hepatic ducts and all intrahepatic branches were mildly dilated, left  and right hepatic duct stents placed, main bile duct brushing cytology-no evidence of malignancy CT 08/30/2022-slight decrease in size of segment 5 hepatic lesion, no evidence of progressive disease, stable 0.5 cm left upper lobe groundglass nodule ERCP with biliary stent exchange at [facility] 10/09/2022 12/10/2022: Removal of intra-arterial infusion pump CTs 12/13/2022-new segment 5 mass, stable treated segment 5 lesion, stable left upper lobe nodule Radiation to segment 5 liver lesion: 01/02/2023 - 01/16/2023, 5000 cGy 01/30/2023: ERCP-biliary stents removed and replaced, stricture in the upper third of the main bile duct was dilated Family history of multiple cancers eluding breast, pancreatic, melanoma, appendiceal cancer, lung cancer   Osteoporosis Neutropenia secondary to chemotherapy, Udenyca added with day 8 cycle 4 Hypermetabolic left thyroid nodule on PET 10/20/2020; thyroid nodule and left neck lymph node biopsy 12/12/2020-both diagnostic of malignancy, papillary thyroid carcinoma; thyroidectomy 03/14/2021 with pathology revealing papillary thyroid cancer with 1 of 8 nodes positive CT neck 09/28/2021-status post thyroidectomy without evidence of residual or recurrent disease Remnant ablation and adjuvant thyroid cancer therapy with oral I-131 11/13/2021 CT neck 12/13/2022: Stable postoperative changes, no residual or recurrent disease         Disposition: AngelaPancoast has intrahepatic cholangiocarcinoma.  She has been maintained off of systemic therapy  since July 2022.  She has completed 2 courses of radiation for treatment of isolated liver lesions.  She is scheduled for restaging CTs next week.  We discussed treatment options if the CTs reveal evidence of disease progression.  I would consider repeat treatment with gemcitabine/cisplatin and adding durvalumab if there is hepatic or systemic progression.  We also discussed IDH1 directed therapy.  She will see Dr. Rod Mae 03/15/2023.  She will return for an office visit here on 03/20/2023.  Thornton Papas, MD  03/04/2023  12:30 PM

## 2023-03-12 IMAGING — MG MM DIGITAL SCREENING BILAT W/ TOMO AND CAD
6 of 10 series · 6 of 30 positions shown · non-contrast
Comparison: Previous exam(s).

CLINICAL DATA: Screening.

EXAM:
DIGITAL SCREENING BILATERAL MAMMOGRAM WITH TOMOSYNTHESIS AND CAD
TECHNIQUE: Bilateral screening digital craniocaudal and mediolateral oblique
mammograms were obtained. Bilateral screening digital breast
tomosynthesis was performed. The images were evaluated with
computer-aided detection.

[L CC synth-2D]
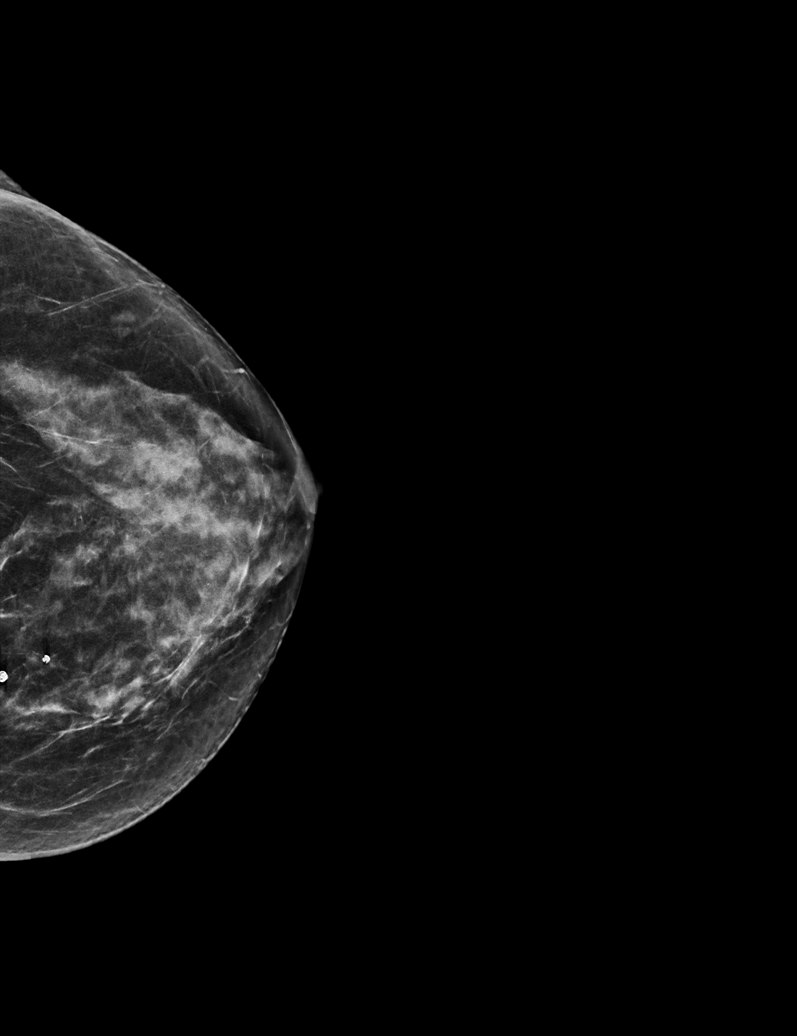

[R MLO synth-2D (1 of 2)]
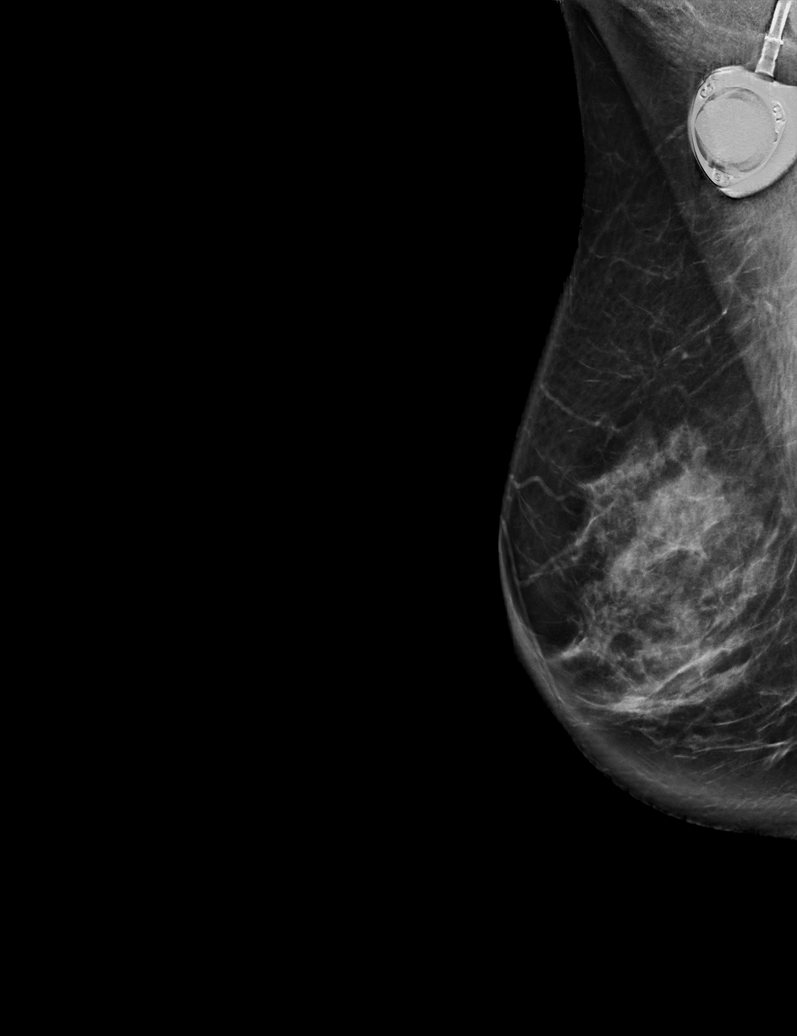

[L MLO synth-2D]
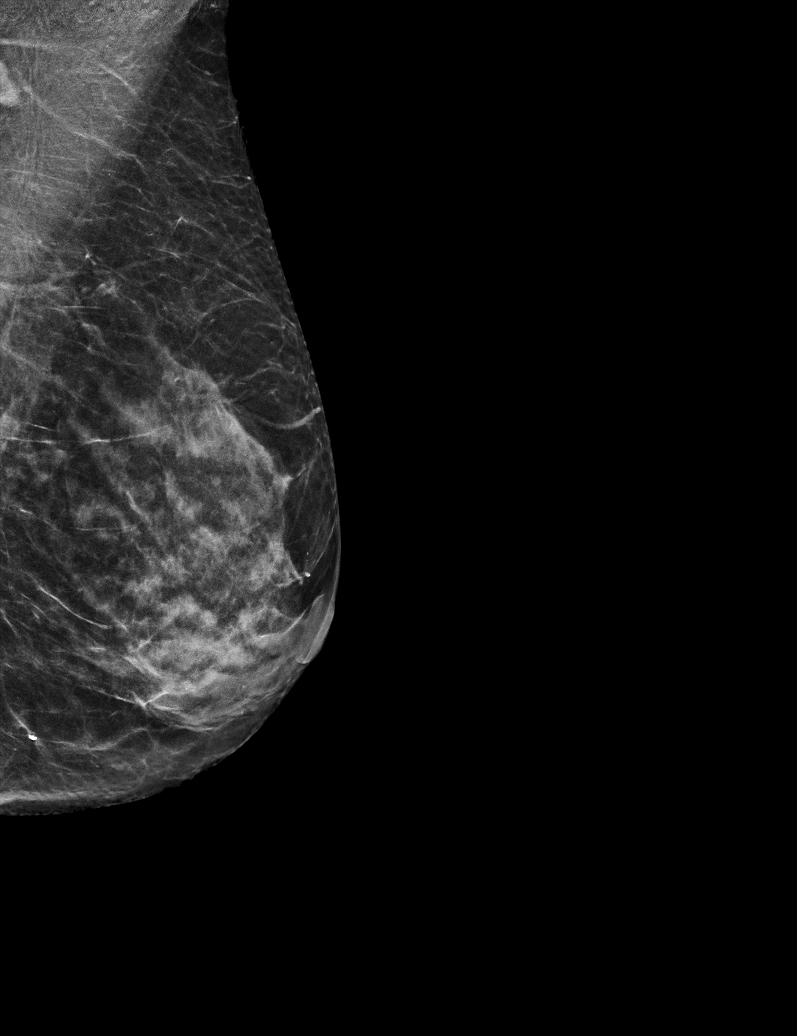

[R CC synth-2D]
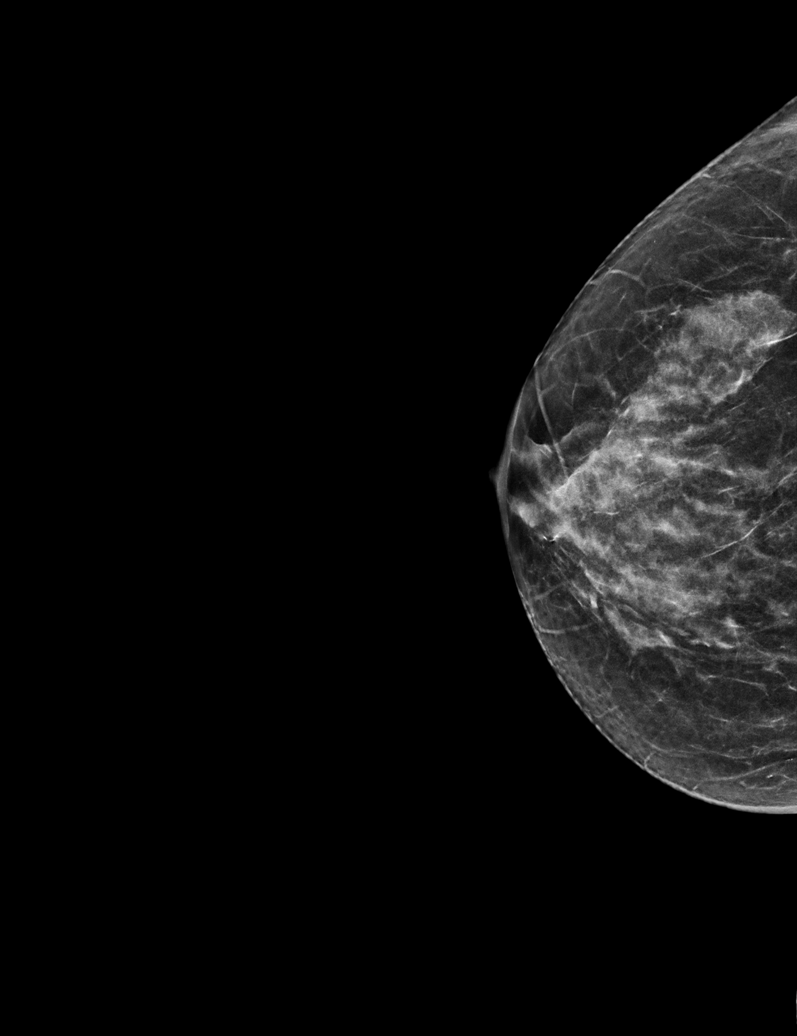

[R MLO synth-2D (2 of 2)]
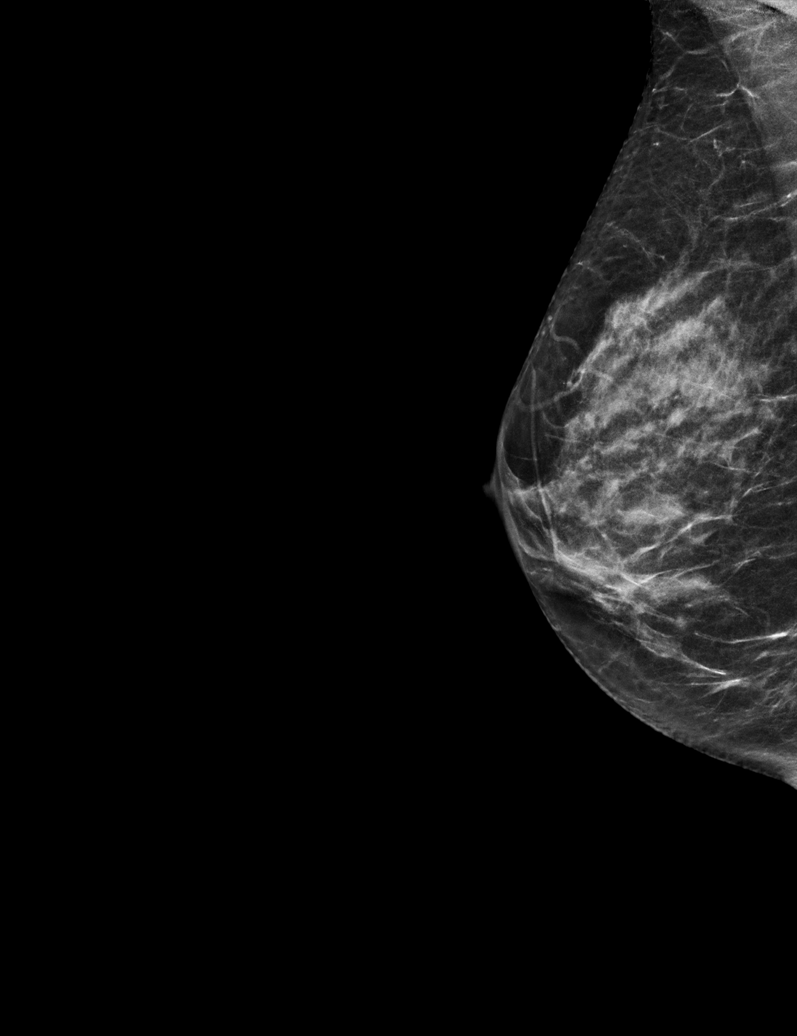

[R CC tomo · tomo slice 27/52.0]
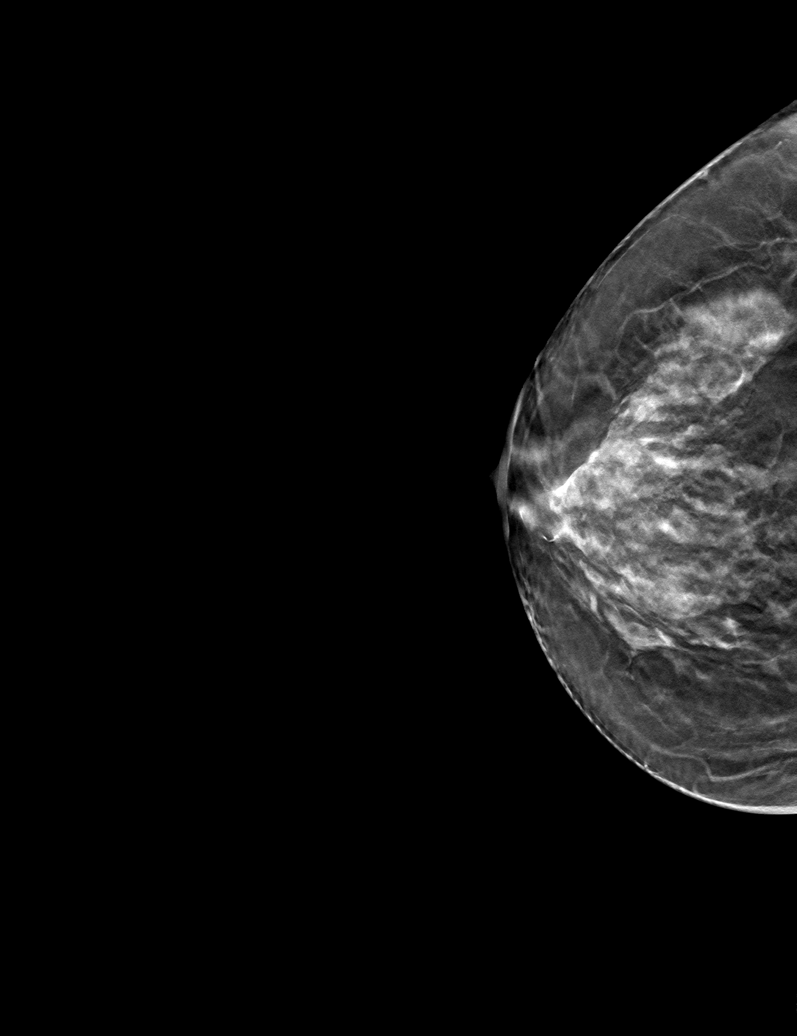

[6 of 30 positions shown; findings below may reference images not displayed]

ACR Breast Density Category c: The breast tissue is heterogeneously
dense, which may obscure small masses.
FINDINGS: There are no findings suspicious for malignancy.
IMPRESSION: No mammographic evidence of malignancy. A result letter of this
screening mammogram will be mailed directly to the patient.

RECOMMENDATION:
Screening mammogram in one year. (Code:Q3-W-BC3)

BI-RADS CATEGORY  1: Negative.

## 2023-03-20 ENCOUNTER — Inpatient Hospital Stay: Payer: Medicare Other | Attending: Oncology | Admitting: Oncology

## 2023-03-20 ENCOUNTER — Inpatient Hospital Stay
Admission: RE | Admit: 2023-03-20 | Discharge: 2023-03-20 | Disposition: A | Discharge status: Home / Self Care | Payer: Self-pay | Source: Ambulatory Visit | Attending: Oncology | Admitting: Oncology

## 2023-03-20 VITALS — BP 104/65 | HR 79 | Temp 98.2°F | Resp 18 | Ht 64.0 in | Wt 123.3 lb

## 2023-03-20 DIAGNOSIS — C221 Intrahepatic bile duct carcinoma: Secondary | ICD-10-CM

## 2023-03-20 DIAGNOSIS — Z923 Personal history of irradiation: Secondary | ICD-10-CM | POA: Diagnosis not present

## 2023-03-20 DIAGNOSIS — Z9221 Personal history of antineoplastic chemotherapy: Secondary | ICD-10-CM | POA: Diagnosis not present

## 2023-03-20 DIAGNOSIS — M810 Age-related osteoporosis without current pathological fracture: Secondary | ICD-10-CM | POA: Insufficient documentation

## 2023-03-20 NOTE — Progress Notes (Signed)
 [facility] OFFICE PROGRESS NOTE   Diagnosis: Cholangiocarcinoma  INTERVAL HISTORY:   Angela Fleming returns as scheduled.  She generally feels well.  She reports intermittent right upper abdominal pain for the past several weeks.  The pain is worse with position change and use of the abdominal muscles.  No consistent pain.  No nausea. She underwent a restaging evaluation at [facility] on 03/15/2023. A CT revealed a decrease in the inferior hepatic lesion and unchanged superior hepatic lesion.  No new site of metastatic disease.  There was evidence of a low-grade small bowel obstruction of the right upper quadrant adjacent to the liver lesion.  A fluid-filled and mildly distended loop of small bowel was noted in the mid abdomen with upstream fecalization of small bowel contents.  Dr. Sheilda does not recommend systemic therapy at present.  Angela Fleming schedule for a restaging evaluation in May.  She is scheduled for Objective:  Vital signs in last 24 hours:  Blood pressure 104/65, pulse 79, temperature 98.2 F (36.8 C), temperature source Temporal, resp. rate 18, height 5' 4 (1.626 m), weight 123 lb 4.8 oz (55.9 kg), SpO2 100%.    Resp: Lungs clear bilaterally Cardio: Regular rate and rhythm GI: Nontender, no mass, no hepatosplenomegaly nondistended Vascular: No leg edema   Portacath/PICC-without erythema  Lab Results:  Lab Results  Component Value Date   WBC 5.0 10/07/2020   HGB 12.2 10/07/2020   HCT 37.9 10/07/2020   MCV 90.2 10/07/2020   PLT 185 10/07/2020   NEUTROABS 3.0 10/07/2020    CMP  Lab Results  Component Value Date   NA 140 11/07/2022   K 3.9 11/07/2022   CL 107 11/07/2022   CO2 26 11/07/2022   GLUCOSE 111 (H) 11/07/2022   BUN 15 11/07/2022   CREATININE 0.63 11/07/2022   CALCIUM 8.7 (L) 11/07/2022   PROT 6.4 (L) 11/07/2022   ALBUMIN 4.2 11/07/2022   AST 25 11/07/2022   ALT 24 11/07/2022   ALKPHOS 109 11/07/2022   BILITOT 0.5 11/07/2022    GFRNONAA >60 11/07/2022   GFRAA >60 05/13/2019    Lab Results  Component Value Date   CEA1 1.02 01/02/2019   RJW800 5 01/02/2019     Medications: I have reviewed the patient's current medications.   Assessment/Plan: Intrahepatic cholangiocarcinoma Abdominal ultrasound 10/08/2018-heterogenous right hepatic mass MRI abdomen 10/14/2018-2 enhancing right liver lesions, 4.8 x 4.5 x 4.1 cm, more superior lesion measured 3 x 2 x 2.5 cm, multiple additional subcentimeter T2 hyperintense lesions-too small to characterize. CT chest 11/04/2018-6 mm left upper lobe groundglass nodule, no evidence of metastatic disease in the chest Biopsy of right liver lesion at [facility] 11/10/2018-moderately differentiated adenocarcinoma consistent with cholangiocarcinoma; IDH1 alteration; MSI stable; mismatch repair status proficient; tumor mutational burden low, 3; PD-L1 negative CTs at [facility] 12/12/2018-right hepatic lobe masses, satellite lesions adjacent to the dominant mass, prominent periportal and aortocaval lymph nodes prominent left supraclavicular node Cycle 1 gemcitabine /cisplatin  at [facility] on 12/12/2018 Cycle 2 gemcitabine /cisplatin  01/02/2019, 01/09/2019 Cycle 3 gemcitabine /cisplatin  01/23/2019 CTs at [facility] 02/02/2019-decrease in size of multiple hepatic masses, decreased size of prominent periportal and aortocaval nodes, unchanged prominent left supraclavicular node Cycle 4 gemcitabine /cisplatin  02/11/2019, Udenyca  added and gemcitabine  dose reduced with day 8 chemotherapy Cycle 5 gemcitabine /cisplatin  03/04/2019, gemcitabine  reescalated to full dose Cycle 6 gemcitabine /cisplatin  03/25/2019 Cycle 7 gemcitabine /cisplatin  04/15/2019 Cycle 8 gemcitabine /cisplatin  05/06/2019 CT 05/18/2019-decreased size of multiple right hepatic masses 06/17/2019-hepatic arterial infusion pump placed, resection of hepatic artery/portal nodes, 1/7 portal nodes positive  07/10/2019-FUDR No. 1 07/23/2019 gemcitabine  800 mg/m  added 08/06/2019-FUDR No. 2, 55 mg (dose reduced due to mild increase in bilirubin (, gemcitabine  800 mg/m and oxaliplatin 68 mg/m 08/20/2019-treatment continued, Decadron  and pump continue due to mild increase in alkaline phosphatase 09/17/2019, FUDR No. 3 at 28 mg (dose reduced due to continued increase in alkaline phosphatase) 10/01/2019, CTs with continued response to therapy, decrease in liver disease 10/01/2019, therapy continued 10/15/2019, therapy continue with gemcitabine  and oxaliplatin, FUDR restarted at 55 mg 11/12/2019-alkaline phosphatase elevated, FUDR held, Decadron  given in hepatic infusion flush 11/26/2019 chemotherapy held secondary to elevated liver enzymes 11/26/2019-CT with continued improvement in the hepatic tumor burden 12/10/2019-chemotherapy restarted with gemcitabine /oxaliplatin 12/24/2019 chemotherapy continued, FUDR 28 mg 02/04/2020-oxaliplatin held secondary to worsening neuropathy 03/02/2020-CTs-stable disease Treatment continued with FUDR and gemcitabine  CTs 05/26/2020-unchanged treated lesions in the liver, unchanged left upper lobe pulmonary nodules, no evidence of disease progression 05/26/2020-recommendation to continue current therapy with gemcitabine  plus FUDR (as labs allow) 06/09/2020-FUDR held 06/23/2020-FUDR held, continue gemcitabine , steroids given in HIA flush 07/21/2020 every 2-week Gemcitabine  CTs at [facility] 08/18/2020-stable treated hepatic metastases, no evidence of new or enlarging metastatic disease Every 2-week gemcitabine  continued 08/31/2020 gemcitabine , Udenyca  MRI 09/16/2020 at [facility]-irregularly shaped 1.9 cm lesion in hepatic segment 5 favored to represent posttreatment fibrosis.  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  Progressively increasing intrahepatic ductal dilatation over the past several exams. ERCP at [facility] 10/03/2020-single severe localized biliary stricture was found in the common hepatic  duct.  The stricture was fibrotic.  The left and right hepatic ducts and all intrahepatic branches were moderately dilated.  The upper third of the main bile duct was successfully dilated.  Plastic biliary stent was placed into the common bile duct.  Brushings were obtained in the upper third of the main bile duct-no evidence of malignancy. PET CT at [facility] 10/20/2020-decreased intrahepatic duct dilation, previously described segment 5 lesion without increased FDG activity, hypermetabolic left thyroid  lesion CTs at [facility] 12/26/2020-stable tiny bilateral pulmonary nodules, stable mild Intermatic biliary ductal dilatation, mild thickening of the common bile duct, stable subcentimeter low-attenuation liver lesion ERCP 01/12/2021-severe localized complex stenoses in the common hepatic duct, hepatic bifurcation, left main hepatic duct, and right main hepatic duct.  A plastic stent was placed into the right hepatic duct CTs 03/29/2021-similar appearance of liver lesions and delayed enhancing mass at the tip of the right lobe of the liver.  Similar bilateral pulmonary nodules. CTs 06/28/2021-unchanged liver lesions, no new metastatic disease, stable lung nodules.  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 ERCP with right and left hepatic stent exchange 07/27/2021 ERCP 10/26/2021-single biliary stricture in the common hepatic duct, left and right main hepatic duct dilated, plastic stents placed in the left and right hepatic ducts CTs 09/28/2021-no evidence of progressive metastatic disease CTs 01/11/2022-increased size of segment 5 hepatic lesion, no evidence of distant metastatic disease, stable sub-5 mm pulmonary nodules, hepatic artery infusion pump terminates in a fluid collection anterior to the hepatic artery MRI  liver 01/26/2022-interval increase in size of segment 5 hepatic lesion, new areas of hyperenhancement in the central liver adjacent to the hepatic hilum, decrease in intrahepatic biliary ductal dilatation with similar short segment stricture at the intrahepatic biliary confluence 01/26/2022-injection of HIA pump showed focal collection of contrast  at the HIA catheter tip consistent with extravasation-hepatic pump turned off Radiation to liver 02/13/2022 - 02/19/2022, 5000 cGy CTs 05/21/2022-stable hepatic segment 5 lesion, no evidence of progressive metastatic disease ERCP 05/22/2022-single moderate stricture in the common hepatic duct, left and right hepatic ducts and all intrahepatic branches were mildly dilated, left and right hepatic duct stents placed, main bile duct brushing cytology-no evidence of malignancy CT 08/30/2022-slight decrease in size of segment 5 hepatic lesion, no evidence of progressive disease, stable 0.5 cm left upper lobe groundglass nodule ERCP with biliary stent exchange at [facility] 10/09/2022 12/10/2022: Removal of intra-arterial infusion pump CTs 12/13/2022-new segment 5 mass, stable treated segment 5 lesion, stable left upper lobe nodule Radiation to segment 5 liver lesion: 01/02/2023 - 01/16/2023, 5000 cGy 01/30/2023: ERCP-biliary stents removed and replaced, stricture in the upper third of the main bile duct was dilated CTs 03/15/2023-decreased size and soft tissue component of the inferior hepatic lesion and unchanged more superior hepatic lesion, no new site of metastatic disease, fluid-filled and mildly distended small bowel loop in the mid abdomen with caliber change in the right upper quadrant Family history of multiple cancers eluding breast, pancreatic, melanoma, appendiceal cancer, lung cancer   Osteoporosis Neutropenia secondary to chemotherapy, Udenyca  added with day 8 cycle 4 Hypermetabolic left thyroid  nodule on PET 10/20/2020; thyroid  nodule and left neck lymph node biopsy  12/12/2020-both diagnostic of malignancy, papillary thyroid  carcinoma; thyroidectomy 03/14/2021 with pathology revealing papillary thyroid  cancer with 1 of 8 nodes positive CT neck 09/28/2021-status post thyroidectomy without evidence of residual or recurrent disease Remnant ablation and adjuvant thyroid  cancer therapy with oral I-131 11/13/2021 CT neck 12/13/2022: Stable postoperative changes, no residual or recurrent disease         Disposition: Angela Fleming appears stable.  We reviewed the restaging CT findings.  The significance of the distended loop of bowel in the right upper quadrant is unclear.  This could be related to adhesions, transient ileus, or less likely obstruction from tumor.  She will call for increased symptoms.  We will obtain the CT images for review here.  She will return for an office visit as scheduled on 04/18/2023.  She is scheduled for an ERCP/stent exchange in April and a restaging CT evaluation at [facility] in May.  Arley Hof, MD  03/20/2023  1:39 PM

## 2023-03-20 NOTE — Progress Notes (Signed)
 Requested [facility] CT images of 03/15/23 be imported w/radiology.

## 2023-04-02 ENCOUNTER — Other Ambulatory Visit (HOSPITAL_COMMUNITY): Payer: Self-pay | Admitting: Internal Medicine

## 2023-04-02 DIAGNOSIS — E7800 Pure hypercholesterolemia, unspecified: Secondary | ICD-10-CM

## 2023-04-18 ENCOUNTER — Ambulatory Visit: Payer: Medicare Other | Admitting: Oncology

## 2023-04-23 ENCOUNTER — Encounter

## 2023-04-23 ENCOUNTER — Ambulatory Visit: Admitting: Oncology

## 2023-04-26 ENCOUNTER — Telehealth: Payer: Self-pay | Admitting: Oncology

## 2023-04-26 ENCOUNTER — Other Ambulatory Visit: Payer: Self-pay | Admitting: *Deleted

## 2023-04-26 DIAGNOSIS — E785 Hyperlipidemia, unspecified: Secondary | ICD-10-CM

## 2023-04-26 DIAGNOSIS — R739 Hyperglycemia, unspecified: Secondary | ICD-10-CM

## 2023-04-26 NOTE — Telephone Encounter (Signed)
 Spoke with patient confirming upcoming appointment

## 2023-04-26 NOTE — Progress Notes (Signed)
 Received fax from [facility] requesting Hgb A1c and Lipid panel be drawn with next appointment at [facility] per patient request. Orders placed and scheduling notified.

## 2023-05-01 ENCOUNTER — Other Ambulatory Visit: Payer: Self-pay | Admitting: *Deleted

## 2023-05-01 DIAGNOSIS — E785 Hyperlipidemia, unspecified: Secondary | ICD-10-CM

## 2023-05-08 ENCOUNTER — Inpatient Hospital Stay: Payer: PRIVATE HEALTH INSURANCE

## 2023-05-08 ENCOUNTER — Inpatient Hospital Stay

## 2023-05-08 ENCOUNTER — Telehealth: Payer: Self-pay | Admitting: Oncology

## 2023-05-08 ENCOUNTER — Encounter: Payer: Self-pay | Admitting: Oncology

## 2023-05-08 ENCOUNTER — Inpatient Hospital Stay: Attending: Oncology | Admitting: Oncology

## 2023-05-08 VITALS — BP 106/63 | HR 68 | Temp 98.1°F | Resp 18 | Ht 64.0 in | Wt 122.7 lb

## 2023-05-08 DIAGNOSIS — Z800 Family history of malignant neoplasm of digestive organs: Secondary | ICD-10-CM | POA: Insufficient documentation

## 2023-05-08 DIAGNOSIS — R739 Hyperglycemia, unspecified: Secondary | ICD-10-CM | POA: Diagnosis not present

## 2023-05-08 DIAGNOSIS — Z85850 Personal history of malignant neoplasm of thyroid: Secondary | ICD-10-CM | POA: Insufficient documentation

## 2023-05-08 DIAGNOSIS — M810 Age-related osteoporosis without current pathological fracture: Secondary | ICD-10-CM | POA: Diagnosis not present

## 2023-05-08 DIAGNOSIS — E785 Hyperlipidemia, unspecified: Secondary | ICD-10-CM | POA: Insufficient documentation

## 2023-05-08 DIAGNOSIS — Z9221 Personal history of antineoplastic chemotherapy: Secondary | ICD-10-CM | POA: Insufficient documentation

## 2023-05-08 DIAGNOSIS — Z923 Personal history of irradiation: Secondary | ICD-10-CM | POA: Diagnosis not present

## 2023-05-08 DIAGNOSIS — Z803 Family history of malignant neoplasm of breast: Secondary | ICD-10-CM | POA: Diagnosis not present

## 2023-05-08 DIAGNOSIS — Z801 Family history of malignant neoplasm of trachea, bronchus and lung: Secondary | ICD-10-CM | POA: Diagnosis not present

## 2023-05-08 DIAGNOSIS — Z808 Family history of malignant neoplasm of other organs or systems: Secondary | ICD-10-CM | POA: Insufficient documentation

## 2023-05-08 DIAGNOSIS — C221 Intrahepatic bile duct carcinoma: Secondary | ICD-10-CM | POA: Insufficient documentation

## 2023-05-08 DIAGNOSIS — Z95828 Presence of other vascular implants and grafts: Secondary | ICD-10-CM

## 2023-05-08 LAB — LIPID PANEL
Cholesterol: 224 mg/dL — ABNORMAL HIGH (ref 0–200)
HDL: 71 mg/dL (ref >40)
LDL Cholesterol: 139 mg/dL — ABNORMAL HIGH (ref 0–99)
Total CHOL/HDL Ratio: 3.2 ratio
Triglycerides: 68 mg/dL (ref <150)
VLDL: 14 mg/dL (ref 0–40)

## 2023-05-08 LAB — HEMOGLOBIN A1C
Hgb A1c MFr Bld: 5.2 % (ref 4.8–5.6)
Mean Plasma Glucose: 102.54 mg/dL

## 2023-05-08 MED ORDER — SODIUM CHLORIDE 0.9% FLUSH
10.0000 mL | INTRAVENOUS | Status: DC | PRN
  Administered 2023-05-08: 10 mL

## 2023-05-08 MED ORDER — HEPARIN SOD (PORK) LOCK FLUSH 100 UNIT/ML IV SOLN
500.0000 [IU] | Freq: Once | INTRAVENOUS | Status: AC | PRN
  Administered 2023-05-08: 500 [IU]

## 2023-05-08 NOTE — Progress Notes (Signed)
 [facility] OFFICE PROGRESS NOTE   Diagnosis: Cholangiocarcinoma  INTERVAL HISTORY:   Angela Fleming returns as scheduled.  She feels well.  No abdominal pain, nausea, or diarrhea.  No complaint.  Objective:  Vital signs in last 24 hours:  Blood pressure 106/63, pulse 68, temperature 98.1 F (36.7 C), temperature source Temporal, resp. rate 18, height 5\' 4"  (1.626 m), weight 122 lb 11.2 oz (55.7 kg), SpO2 96%.    GI: No mass, nontender, no hepatosplenomegaly, nondistended    Portacath/PICC-without erythema  Lab Results:  Lab Results  Component Value Date   WBC 5.0 10/07/2020   HGB 12.2 10/07/2020   HCT 37.9 10/07/2020   MCV 90.2 10/07/2020   PLT 185 10/07/2020   NEUTROABS 3.0 10/07/2020    CMP  Lab Results  Component Value Date   NA 140 11/07/2022   K 3.9 11/07/2022   CL 107 11/07/2022   CO2 26 11/07/2022   GLUCOSE 111 (H) 11/07/2022   BUN 15 11/07/2022   CREATININE 0.63 11/07/2022   CALCIUM 8.7 (L) 11/07/2022   PROT 6.4 (L) 11/07/2022   ALBUMIN 4.2 11/07/2022   AST 25 11/07/2022   ALT 24 11/07/2022   ALKPHOS 109 11/07/2022   BILITOT 0.5 11/07/2022   GFRNONAA >60 11/07/2022   GFRAA >60 05/13/2019    Lab Results  Component Value Date   CEA1 1.02 01/02/2019   ZOX096 5 01/02/2019    No results found for: "INR", "LABPROT"  Imaging:  No results found.  Medications: I have reviewed the patient's current medications.   Assessment/Plan: Intrahepatic cholangiocarcinoma Abdominal ultrasound 10/08/2018-heterogenous right hepatic mass MRI abdomen 10/14/2018-2 enhancing right liver lesions, 4.8 x 4.5 x 4.1 cm, more superior lesion measured 3 x 2 x 2.5 cm, multiple additional subcentimeter T2 hyperintense lesions-too small to characterize. CT chest 11/04/2018-6 mm left upper lobe groundglass nodule, no evidence of metastatic disease in the chest Biopsy of right liver lesion at [facility] 11/10/2018-moderately differentiated adenocarcinoma  consistent with cholangiocarcinoma; IDH1 alteration; MSI stable; mismatch repair status proficient; tumor mutational burden low, 3; PD-L1 negative CTs at [facility] 12/12/2018-right hepatic lobe masses, satellite lesions adjacent to the dominant mass, prominent periportal and aortocaval lymph nodes prominent left supraclavicular node Cycle 1 gemcitabine/cisplatin at [facility] on 12/12/2018 Cycle 2 gemcitabine/cisplatin 01/02/2019, 01/09/2019 Cycle 3 gemcitabine/cisplatin 01/23/2019 CTs at [facility] 02/02/2019-decrease in size of multiple hepatic masses, decreased size of prominent periportal and aortocaval nodes, unchanged prominent left supraclavicular node Cycle 4 gemcitabine/cisplatin 02/11/2019, Udenyca added and gemcitabine dose reduced with day 8 chemotherapy Cycle 5 gemcitabine/cisplatin 03/04/2019, gemcitabine reescalated to full dose Cycle 6 gemcitabine/cisplatin 03/25/2019 Cycle 7 gemcitabine/cisplatin 04/15/2019 Cycle 8 gemcitabine/cisplatin 05/06/2019 CT 05/18/2019-decreased size of multiple right hepatic masses 06/17/2019-hepatic arterial infusion pump placed, resection of hepatic artery/portal nodes, 1/7 portal nodes positive 07/10/2019-FUDR No. 1 07/23/2019 gemcitabine 800 mg/m added 08/06/2019-FUDR No. 2, 55 mg (dose reduced due to mild increase in bilirubin (, gemcitabine 800 mg/m and oxaliplatin 68 mg/m 08/20/2019-treatment continued, Decadron and pump continue due to mild increase in alkaline phosphatase 09/17/2019, FUDR No. 3 at 28 mg (dose reduced due to continued increase in alkaline phosphatase) 10/01/2019, CTs with continued response to therapy, decrease in liver disease 10/01/2019, therapy continued 10/15/2019, therapy continue with gemcitabine and oxaliplatin, FUDR restarted at 55 mg 11/12/2019-alkaline phosphatase elevated, FUDR held, Decadron given in hepatic infusion flush 11/26/2019 chemotherapy held secondary to elevated liver enzymes 11/26/2019-CT with continued improvement in the hepatic tumor  burden 12/10/2019-chemotherapy restarted with gemcitabine/oxaliplatin 12/24/2019 chemotherapy continued, FUDR 28 mg  02/04/2020-oxaliplatin held secondary to worsening neuropathy 03/02/2020-CTs-stable disease Treatment continued with FUDR and gemcitabine CTs 05/26/2020-unchanged treated lesions in the liver, unchanged left upper lobe pulmonary nodules, no evidence of disease progression 05/26/2020-recommendation to continue current therapy with gemcitabine plus FUDR (as labs allow) 06/09/2020-FUDR held 06/23/2020-FUDR held, continue gemcitabine, steroids given in HIA flush 07/21/2020 every 2-week Gemcitabine CTs at [facility] 08/18/2020-stable treated hepatic metastases, no evidence of new or enlarging metastatic disease Every 2-week gemcitabine continued 08/31/2020 gemcitabine, Udenyca MRI 09/16/2020 at [facility]-irregularly shaped 1.9 cm lesion in hepatic segment 5 favored to represent posttreatment fibrosis.  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  Progressively increasing intrahepatic ductal dilatation over the past several exams. ERCP at [facility] 10/03/2020-single severe localized biliary stricture was found in the common hepatic duct.  The stricture was fibrotic.  The left and right hepatic ducts and all intrahepatic branches were moderately dilated.  The upper third of the main bile duct was successfully dilated.  Plastic biliary stent was placed into the common bile duct.  Brushings were obtained in the upper third of the main bile duct-no evidence of malignancy. PET CT at [facility] 10/20/2020-decreased intrahepatic duct dilation, previously described segment 5 lesion without increased FDG activity, hypermetabolic left thyroid lesion CTs at [facility] 12/26/2020-stable tiny bilateral pulmonary nodules, stable mild Intermatic biliary ductal dilatation, mild thickening of the common bile duct, stable subcentimeter low-attenuation liver lesion ERCP 01/12/2021-severe  localized complex stenoses in the common hepatic duct, hepatic bifurcation, left main hepatic duct, and right main hepatic duct.  A plastic stent was placed into the right hepatic duct CTs 03/29/2021-similar appearance of liver lesions and delayed enhancing mass at the tip of the right lobe of the liver.  Similar bilateral pulmonary nodules. CTs 06/28/2021-unchanged liver lesions, no new metastatic disease, stable lung nodules.  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 ERCP with right and left hepatic stent exchange 07/27/2021 ERCP 10/26/2021-single biliary stricture in the common hepatic duct, left and right main hepatic duct dilated, plastic stents placed in the left and right hepatic ducts CTs 09/28/2021-no evidence of progressive metastatic disease CTs 01/11/2022-increased size of segment 5 hepatic lesion, no evidence of distant metastatic disease, stable sub-5 mm pulmonary nodules, hepatic artery infusion pump terminates in a fluid collection anterior to the hepatic artery MRI liver 01/26/2022-interval increase in size of segment 5 hepatic lesion, new areas of hyperenhancement in the central liver adjacent to the hepatic hilum, decrease in intrahepatic biliary ductal dilatation with similar short segment stricture at the intrahepatic biliary confluence 01/26/2022-injection of HIA pump showed focal collection of contrast at the HIA catheter tip consistent with extravasation-hepatic pump turned off Radiation to liver 02/13/2022 - 02/19/2022, 5000 cGy CTs 05/21/2022-stable hepatic segment 5 lesion, no evidence of progressive metastatic disease ERCP 05/22/2022-single moderate stricture in the common hepatic duct, left and right hepatic ducts and all intrahepatic branches were mildly dilated, left and right  hepatic duct stents placed, main bile duct brushing cytology-no evidence of malignancy CT 08/30/2022-slight decrease in size of segment 5 hepatic lesion, no evidence of progressive disease, stable 0.5 cm left upper lobe groundglass nodule ERCP with biliary stent exchange at [facility] 10/09/2022 12/10/2022: Removal of intra-arterial infusion pump CTs 12/13/2022-new segment 5 mass, stable treated segment 5 lesion, stable left upper lobe nodule Radiation to segment 5  liver lesion: 01/02/2023 - 01/16/2023, 5000 cGy 01/30/2023: ERCP-biliary stents removed and replaced, stricture in the upper third of the main bile duct was dilated CTs 03/15/2023-decreased size and soft tissue component of the inferior hepatic lesion and unchanged more superior hepatic lesion, no new site of metastatic disease, fluid-filled and mildly distended small bowel loop in the mid abdomen with caliber change in the right upper quadrant Family history of multiple cancers eluding breast, pancreatic, melanoma, appendiceal cancer, lung cancer   Osteoporosis Neutropenia secondary to chemotherapy, Udenyca added with day 8 cycle 4 Hypermetabolic left thyroid nodule on PET 10/20/2020; thyroid nodule and left neck lymph node biopsy 12/12/2020-both diagnostic of malignancy, papillary thyroid carcinoma; thyroidectomy 03/14/2021 with pathology revealing papillary thyroid cancer with 1 of 8 nodes positive CT neck 09/28/2021-status post thyroidectomy without evidence of residual or recurrent disease Remnant ablation and adjuvant thyroid cancer therapy with oral I-131 11/13/2021 CT neck 12/13/2022: Stable postoperative changes, no residual or recurrent disease         Disposition: Angela Fleming appears stable.  There is no evidence for progression of the cholangiocarcinoma.  Abdominal pain she experienced last month has resolved.  We reviewed the 03/15/2023 CT images.  She will call for new symptoms.  Angela Fleming is scheduled for an ERCP/stent exchange in  April and restaging CTs in early May.  She will return for an office visit and Port-A-Cath flush during the week of 07/22/2023.  We discussed systemic treatment options if she develops progressive cholangiocarcinoma.  We discussed gemcitabine/cisplatin/durvalumab, targeted therapy, and clinical trials.  She has looked into a CAR-T therapy trial at [facility].  Thornton Papas, MD  05/08/2023  11:32 AM

## 2023-05-08 NOTE — Telephone Encounter (Signed)
 Spoke with patient confirming upcoming appointment

## 2023-05-08 NOTE — Patient Instructions (Signed)

## 2023-07-23 ENCOUNTER — Other Ambulatory Visit: Payer: PRIVATE HEALTH INSURANCE

## 2023-07-23 ENCOUNTER — Ambulatory Visit: Payer: PRIVATE HEALTH INSURANCE | Admitting: Oncology

## 2023-07-31 ENCOUNTER — Inpatient Hospital Stay (HOSPITAL_BASED_OUTPATIENT_CLINIC_OR_DEPARTMENT_OTHER): Payer: PRIVATE HEALTH INSURANCE | Admitting: Oncology

## 2023-07-31 ENCOUNTER — Inpatient Hospital Stay: Payer: PRIVATE HEALTH INSURANCE | Attending: Oncology

## 2023-07-31 VITALS — BP 102/72 | HR 84 | Temp 97.9°F | Resp 18 | Ht 64.0 in | Wt 123.5 lb

## 2023-07-31 DIAGNOSIS — C221 Intrahepatic bile duct carcinoma: Secondary | ICD-10-CM

## 2023-07-31 DIAGNOSIS — Z452 Encounter for adjustment and management of vascular access device: Secondary | ICD-10-CM | POA: Insufficient documentation

## 2023-07-31 DIAGNOSIS — Z95828 Presence of other vascular implants and grafts: Secondary | ICD-10-CM

## 2023-07-31 MED ORDER — HEPARIN SOD (PORK) LOCK FLUSH 100 UNIT/ML IV SOLN
500.0000 [IU] | Freq: Once | INTRAVENOUS | Status: AC
  Administered 2023-07-31: 500 [IU] via INTRAVENOUS

## 2023-07-31 MED ORDER — SODIUM CHLORIDE 0.9% FLUSH
10.0000 mL | Freq: Once | INTRAVENOUS | Status: AC
  Administered 2023-07-31: 10 mL

## 2023-07-31 NOTE — Progress Notes (Signed)
 [facility] OFFICE PROGRESS NOTE   Diagnosis: Cholangiocarcinoma  INTERVAL HISTORY:   Angela Fleming returns as scheduled.  She feels well.  Good appetite and energy level.  She has soreness in the right subcostal region since undergoing liver radiation.  No fever or chills.  She reports discussing the possibility of a liver transplant with Dr. Nino Bass.  Objective:  Vital signs in last 24 hours:  Blood pressure 102/72, pulse 84, temperature 97.9 F (36.6 C), temperature source Temporal, resp. rate 18, height 5' 4 (1.626 m), weight 123 lb 8 oz (56 kg), SpO2 99%.    Lymphatics: No cervical or supraclavicular nodes Resp: Clear bilaterally Cardio: Regular rate and rhythm GI: No mass, no hepatosplenomegaly, mild tenderness in the right subcostal area Vascular: No leg edema  Portacath/PICC-without erythema  Lab Results:  Lab Results  Component Value Date   WBC 5.0 10/07/2020   HGB 12.2 10/07/2020   HCT 37.9 10/07/2020   MCV 90.2 10/07/2020   PLT 185 10/07/2020   NEUTROABS 3.0 10/07/2020    CMP  Lab Results  Component Value Date   NA 140 11/07/2022   K 3.9 11/07/2022   CL 107 11/07/2022   CO2 26 11/07/2022   GLUCOSE 111 (H) 11/07/2022   BUN 15 11/07/2022   CREATININE 0.63 11/07/2022   CALCIUM 8.7 (L) 11/07/2022   PROT 6.4 (L) 11/07/2022   ALBUMIN 4.2 11/07/2022   AST 25 11/07/2022   ALT 24 11/07/2022   ALKPHOS 109 11/07/2022   BILITOT 0.5 11/07/2022   GFRNONAA >60 11/07/2022   GFRAA >60 05/13/2019    Lab Results  Component Value Date   CEA1 1.02 01/02/2019   [id number] 5 01/02/2019   Medications: I have reviewed the patient's current medications.   Assessment/Plan: Intrahepatic cholangiocarcinoma Abdominal ultrasound 10/08/2018-heterogenous right hepatic mass MRI abdomen 10/14/2018-2 enhancing right liver lesions, 4.8 x 4.5 x 4.1 cm, more superior lesion measured 3 x 2 x 2.5 cm, multiple additional subcentimeter T2 hyperintense lesions-too small to  characterize. CT chest 11/04/2018-6 mm left upper lobe groundglass nodule, no evidence of metastatic disease in the chest Biopsy of right liver lesion at [facility] 11/10/2018-moderately differentiated adenocarcinoma consistent with cholangiocarcinoma; IDH1 alteration; MSI stable; mismatch repair status proficient; tumor mutational burden low, 3; PD-L1 negative CTs at [facility] 12/12/2018-right hepatic lobe masses, satellite lesions adjacent to the dominant mass, prominent periportal and aortocaval lymph nodes prominent left supraclavicular node Cycle 1 gemcitabine /cisplatin  at [facility] on 12/12/2018 Cycle 2 gemcitabine /cisplatin  01/02/2019, 01/09/2019 Cycle 3 gemcitabine /cisplatin  01/23/2019 CTs at [facility] 02/02/2019-decrease in size of multiple hepatic masses, decreased size of prominent periportal and aortocaval nodes, unchanged prominent left supraclavicular node Cycle 4 gemcitabine /cisplatin  02/11/2019, Udenyca  added and gemcitabine  dose reduced with day 8 chemotherapy Cycle 5 gemcitabine /cisplatin  03/04/2019, gemcitabine  reescalated to full dose Cycle 6 gemcitabine /cisplatin  03/25/2019 Cycle 7 gemcitabine /cisplatin  04/15/2019 Cycle 8 gemcitabine /cisplatin  05/06/2019 CT 05/18/2019-decreased size of multiple right hepatic masses 06/17/2019-hepatic arterial infusion pump placed, resection of hepatic artery/portal nodes, 1/7 portal nodes positive 07/10/2019-FUDR No. 1 07/23/2019 gemcitabine  800 mg/m added 08/06/2019-FUDR No. 2, 55 mg (dose reduced due to mild increase in bilirubin (, gemcitabine  800 mg/m and oxaliplatin 68 mg/m 08/20/2019-treatment continued, Decadron  and pump continue due to mild increase in alkaline phosphatase 09/17/2019, FUDR No. 3 at 28 mg (dose reduced due to continued increase in alkaline phosphatase) 10/01/2019, CTs with continued response to therapy, decrease in liver disease 10/01/2019, therapy continued 10/15/2019, therapy continue with gemcitabine  and oxaliplatin, FUDR restarted at 55  mg 11/12/2019-alkaline phosphatase elevated, FUDR  held, Decadron  given in hepatic infusion flush 11/26/2019 chemotherapy held secondary to elevated liver enzymes 11/26/2019-CT with continued improvement in the hepatic tumor burden 12/10/2019-chemotherapy restarted with gemcitabine /oxaliplatin 12/24/2019 chemotherapy continued, FUDR 28 mg 02/04/2020-oxaliplatin held secondary to worsening neuropathy 03/02/2020-CTs-stable disease Treatment continued with FUDR and gemcitabine  CTs 05/26/2020-unchanged treated lesions in the liver, unchanged left upper lobe pulmonary nodules, no evidence of disease progression 05/26/2020-recommendation to continue current therapy with gemcitabine  plus FUDR (as labs allow) 06/09/2020-FUDR held 06/23/2020-FUDR held, continue gemcitabine , steroids given in HIA flush 07/21/2020 every 2-week Gemcitabine  CT[facility] at [facility] 08/18/2020-stable treated hepatic metastases, no evidence of new or enlarging metastatic disease Every 2-week gemcitabine  continued 08/31/2020 gemcitabine , Udenyca  MRI 09/16/2020 at [facility]-irregularly shaped 1.9 cm lesion in hepatic segment 5 favored to represent posttreatment fibrosis.  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  Progressively increasing intrahepatic ductal dilatation over the past several exams. ERCP at [facility] 10/03/2020-single severe localized biliary stricture was found in the common hepatic duct.  The stricture was fibrotic.  The left and right hepatic ducts and all intrahepatic branches were moderately dilated.  The upper third of the main bile duct was successfully dilated.  Plastic biliary stent was placed into the common bile duct.  Brushings were obtained in the upper third of the main bile duct-no evidence of malignancy. PET CT at [facility] 10/20/2020-decreased intrahepatic duct dilation, previously described segment 5 lesion without increased FDG activity, hypermetabolic left thyroid  lesion CTs at  [facility] 12/26/2020-stable tiny bilateral pulmonary nodules, stable mild Intermatic biliary ductal dilatation, mild thickening of the common bile duct, stable subcentimeter low-attenuation liver lesion ERCP 01/12/2021-severe localized complex stenoses in the common hepatic duct, hepatic bifurcation, left main hepatic duct, and right main hepatic duct.  A plastic stent was placed into the right hepatic duct CTs 03/29/2021-similar appearance of liver lesions and delayed enhancing mass at the tip of the right lobe of the liver.  Similar bilateral pulmonary nodules. CTs 06/28/2021-unchanged liver lesions, no new metastatic disease, stable lung nodules.  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 ERCP with right and left hepatic stent exchange 07/27/2021 ERCP 10/26/2021-single biliary stricture in the common hepatic duct, left and right main hepatic duct dilated, plastic stents placed in the left and right hepatic ducts CTs 09/28/2021-no evidence of progressive metastatic disease CTs 01/11/2022-increased size of segment 5 hepatic lesion, no evidence of distant metastatic disease, stable sub-5 mm pulmonary nodules, hepatic artery infusion pump terminates in a fluid collection anterior to the hepatic artery MRI liver 01/26/2022-interval increase in size of segment 5 hepatic lesion, new areas of hyperenhancement in the central liver adjacent to the hepatic hilum, decrease in intrahepatic biliary ductal dilatation with similar short segment stricture at the intrahepatic biliary confluence 01/26/2022-injection of HIA pump showed focal collection of contrast at the HIA catheter tip consistent with extravasation-hepatic pump turned off Radiation to liver 02/13/2022 - 02/19/2022, 5000 cGy CTs 05/21/2022-stable hepatic segment 5  lesion, no evidence of progressive metastatic disease ERCP 05/22/2022-single moderate stricture in the common hepatic duct, left and right hepatic ducts and all intrahepatic branches were mildly dilated, left and right hepatic duct stents placed, main bile duct brushing cytology-no evidence of malignancy CT 08/30/2022-slight decrease in size of segment 5 hepatic lesion, no evidence of progressive disease, stable 0.5 cm left upper lobe groundglass  nodule ERCP with biliary stent exchange at [facility] 10/09/2022 12/10/2022: Removal of intra-arterial infusion pump CTs 12/13/2022-new segment 5 mass, stable treated segment 5 lesion, stable left upper lobe nodule Radiation to segment 5 liver lesion: 01/02/2023 - 01/16/2023, 5000 cGy 01/30/2023: ERCP-biliary stents removed and replaced, stricture in the upper third of the main bile duct was dilated CTs 03/15/2023-decreased size and soft tissue component of the inferior hepatic lesion and unchanged more superior hepatic lesion, no new site of metastatic disease, fluid-filled and mildly distended small bowel loop in the mid abdomen with caliber change in the right upper quadrant CTs 06/13/2023-decreased size of right inferior hepatic lesion, stable hypoenhancing area superior to the lesion, pneumobilia with mild central biliary duct dilation, biliary drains in place, slight increase in enhancement surrounding, bile duct Family history of multiple cancers eluding breast, pancreatic, melanoma, appendiceal cancer, lung cancer   Osteoporosis Neutropenia secondary to chemotherapy, Udenyca  added with day 8 cycle 4 Hypermetabolic left thyroid  nodule on PET 10/20/2020; thyroid  nodule and left neck lymph node biopsy 12/12/2020-both diagnostic of malignancy, papillary thyroid  carcinoma; thyroidectomy 03/14/2021 with pathology revealing papillary thyroid  cancer with 1 of 8 nodes positive CT neck 09/28/2021-status post thyroidectomy without evidence of residual or recurrent  disease Remnant ablation and adjuvant thyroid  cancer therapy with oral I-131 11/13/2021 CT neck 12/13/2022: Stable postoperative changes, no residual or recurrent disease           Disposition: Angela Fleming appears well.  There is no clinical evidence of progressive cholangiocarcinoma.  She is scheduled for restaging CTs and an office visit with Dr. Nino Bass on 09/23/2023.  She is scheduled for exchange of the biliary stents later in August.  She will return for an office visit and Port-A-Cath flush 11/19/2020.  Coni Deep, MD  07/31/2023  3:09 PM

## 2023-08-30 IMAGING — US US THYROID
1 series · 14 of 25 positions shown · non-contrast
Comparison: None.

CLINICAL DATA: Other. History of papillary thyroid cancer, post
total thyroidectomy on 03/14/2021

EXAM:
THYROID ULTRASOUND
TECHNIQUE: Ultrasound examination of the thyroid gland and adjacent soft
tissues was performed.

[Series 1: us thyroid · 35 acquisitions, 14 frames shown]
[im 1/35]
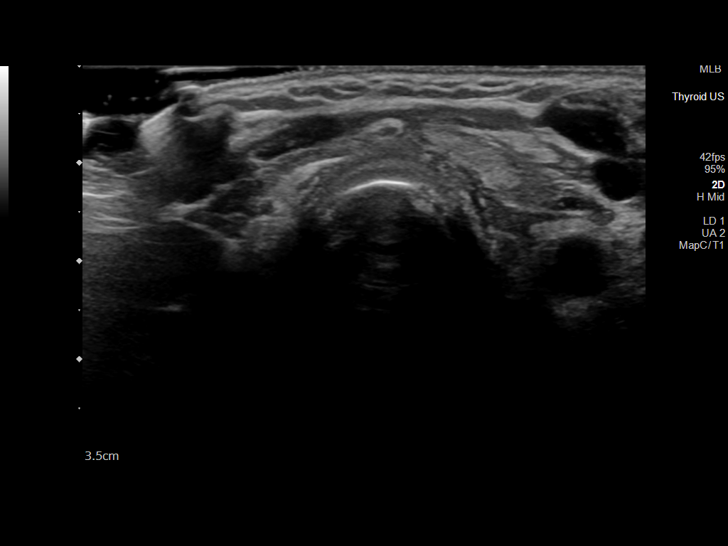
[im 3/35]
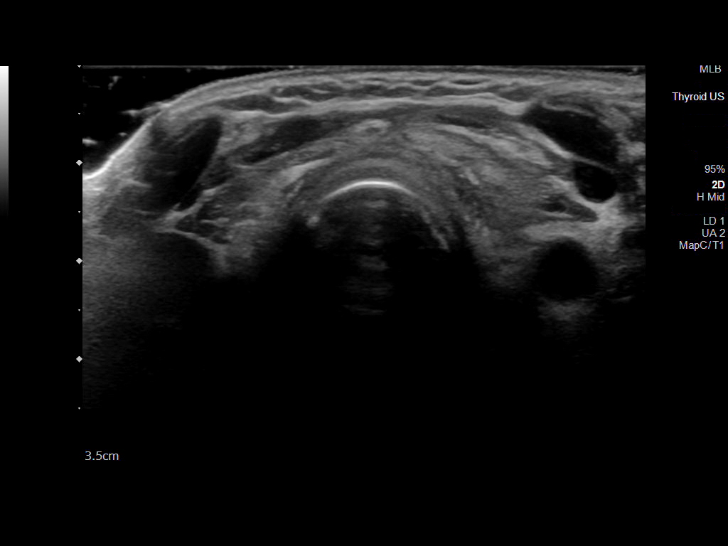
[im 6/35]
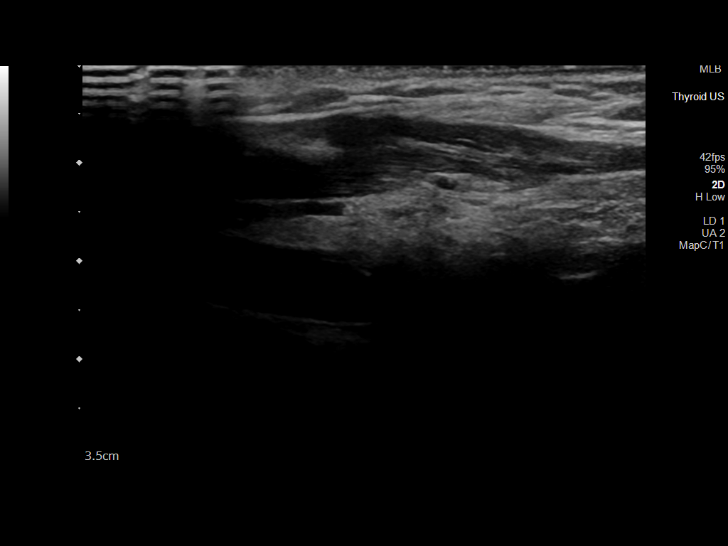
[im 9/35]
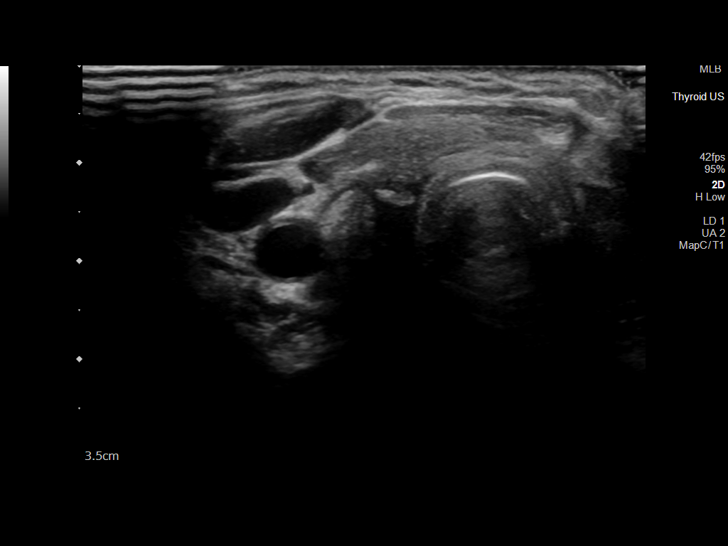
[im 12/35]
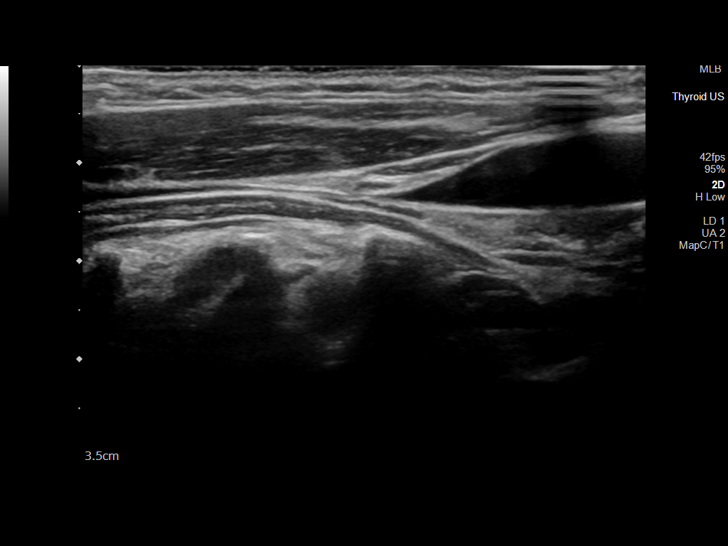
[im 13/35]
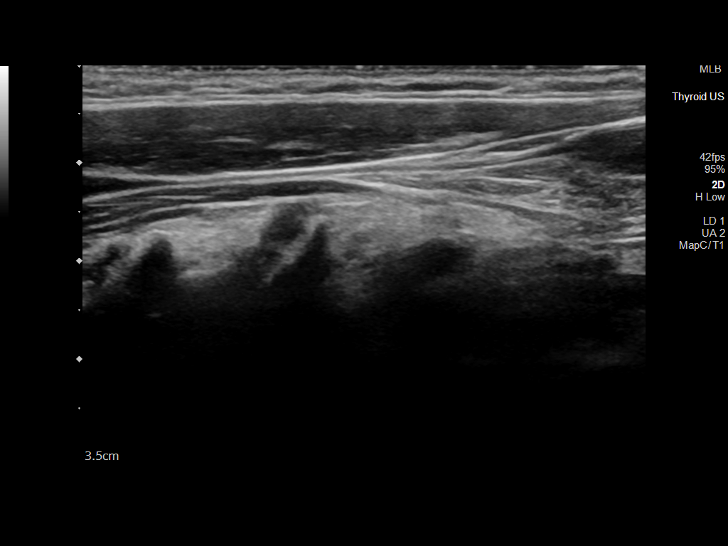
[im 16/35]
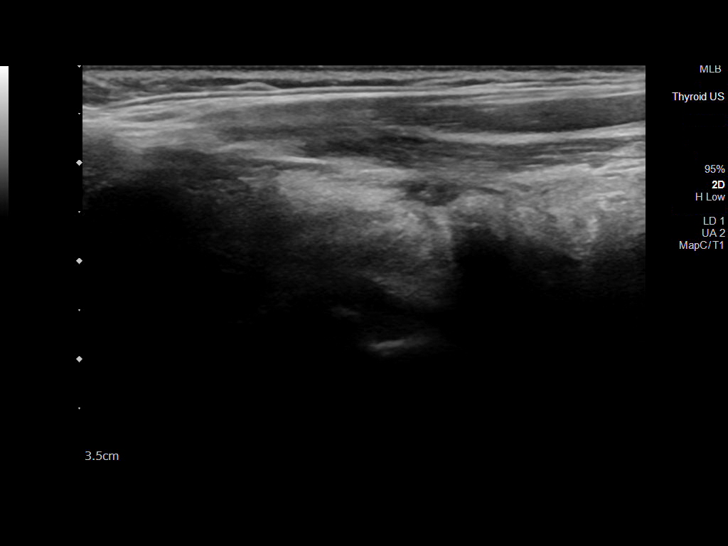
[im 19/35]
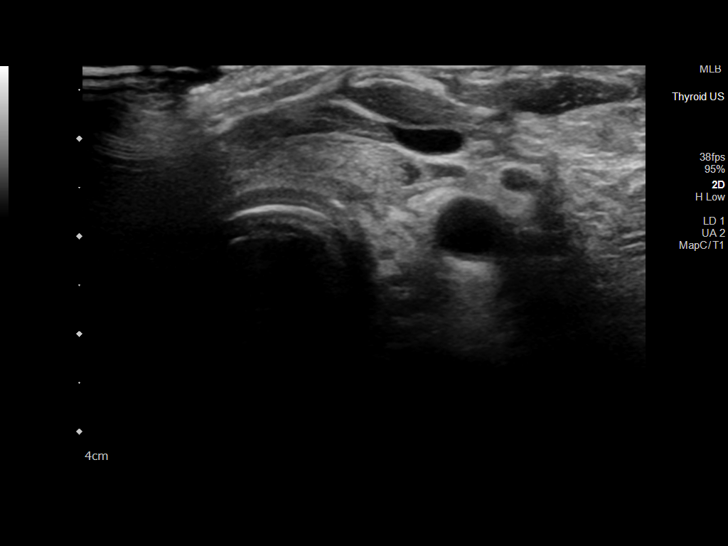
[im 22/35]
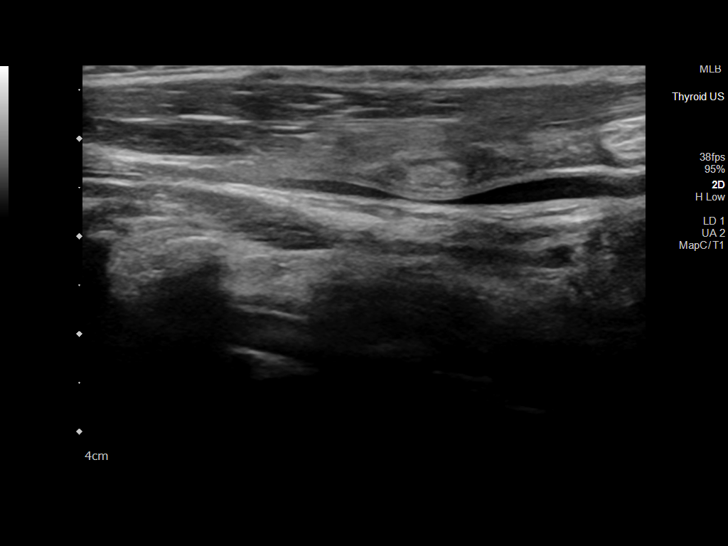
[im 23/35]
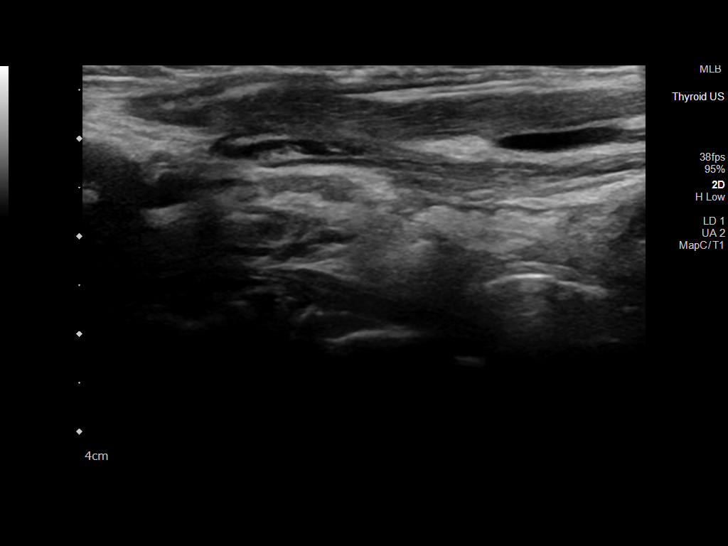
[im 26/35]
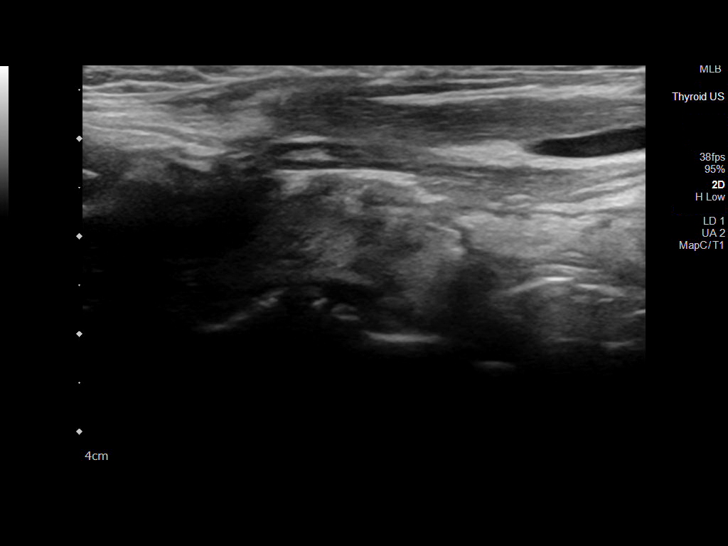
[im 29/35]
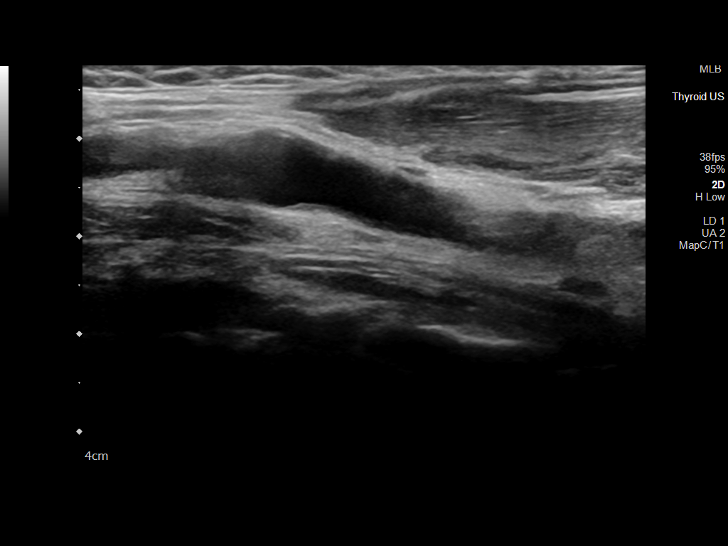
[im 32/35]
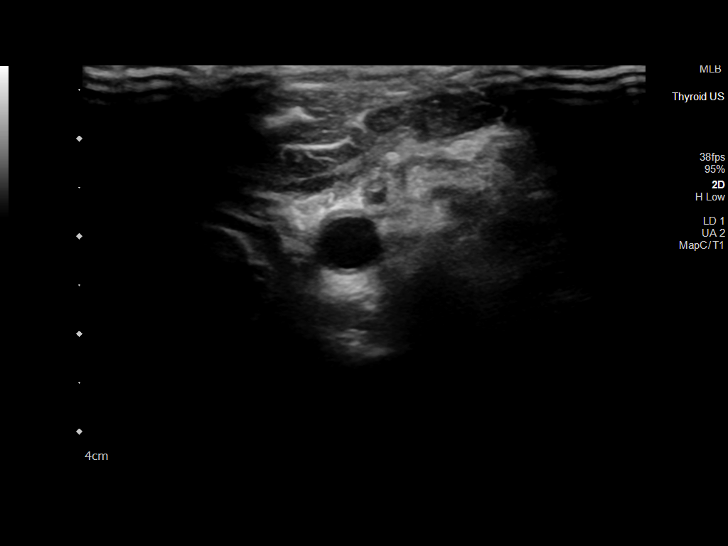
[im 35/35]
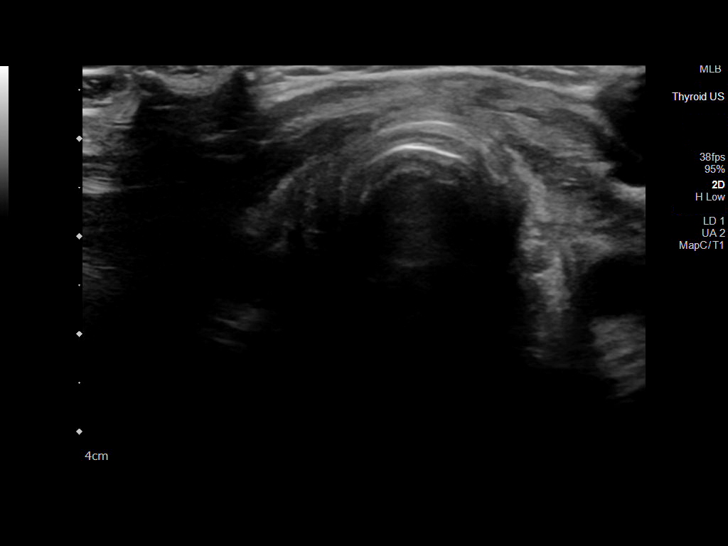

[14 of 25 positions shown; findings below may reference images not displayed]

FINDINGS: Isthmus: Surgically absent. There is no residual nodular soft tissue
within the isthmic resection bed.

Right lobe: Surgically absent. There is no residual nodular soft
tissue within the right lobectomy resection bed.

Left lobe: Surgically absent. There is no residual nodular soft
tissue within the left lobectomy resection bed.

_________________________________________________________

No regional cervical lymphadenopathy.
IMPRESSION: Post total thyroidectomy without evidence of residual or locally
recurrent disease.

## 2023-11-20 ENCOUNTER — Ambulatory Visit: Payer: PRIVATE HEALTH INSURANCE | Admitting: Oncology

## 2023-11-20 ENCOUNTER — Other Ambulatory Visit: Payer: PRIVATE HEALTH INSURANCE

## 2023-11-27 ENCOUNTER — Inpatient Hospital Stay: Payer: PRIVATE HEALTH INSURANCE | Attending: Oncology

## 2023-11-27 DIAGNOSIS — C221 Intrahepatic bile duct carcinoma: Secondary | ICD-10-CM | POA: Diagnosis present

## 2023-11-27 DIAGNOSIS — Z452 Encounter for adjustment and management of vascular access device: Secondary | ICD-10-CM | POA: Insufficient documentation

## 2023-11-27 NOTE — Patient Instructions (Signed)
